# Patient Record
Sex: Female | Born: 1955
Health system: Southern US, Community
[De-identification: ages and names within clinical notes are randomized; demographics above are authoritative.]

## PROBLEM LIST (undated history)

## (undated) DIAGNOSIS — M79609 Pain in unspecified limb: Secondary | ICD-10-CM

## (undated) DIAGNOSIS — K219 Gastro-esophageal reflux disease without esophagitis: Secondary | ICD-10-CM

## (undated) DIAGNOSIS — N200 Calculus of kidney: Secondary | ICD-10-CM

## (undated) DIAGNOSIS — J449 Chronic obstructive pulmonary disease, unspecified: Secondary | ICD-10-CM

## (undated) DIAGNOSIS — M8580 Other specified disorders of bone density and structure, unspecified site: Secondary | ICD-10-CM

## (undated) DIAGNOSIS — E559 Vitamin D deficiency, unspecified: Secondary | ICD-10-CM

## (undated) DIAGNOSIS — E785 Hyperlipidemia, unspecified: Secondary | ICD-10-CM

## (undated) DIAGNOSIS — R42 Dizziness and giddiness: Secondary | ICD-10-CM

## (undated) DIAGNOSIS — M549 Dorsalgia, unspecified: Secondary | ICD-10-CM

## (undated) DIAGNOSIS — I480 Paroxysmal atrial fibrillation: Principal | ICD-10-CM

## (undated) HISTORY — DX: Hyperlipidemia, unspecified: E78.5

## (undated) HISTORY — DX: Calculus of kidney: N20.0

## (undated) HISTORY — DX: Gastro-esophageal reflux disease without esophagitis: K21.9

## (undated) HISTORY — DX: Pain in unspecified limb: M79.609

## (undated) HISTORY — DX: Dizziness and giddiness: R42

## (undated) HISTORY — DX: Paroxysmal atrial fibrillation: I48.0

## (undated) HISTORY — DX: Vitamin D deficiency, unspecified: E55.9

## (undated) HISTORY — DX: Dorsalgia, unspecified: M54.9

## (undated) HISTORY — DX: Other specified disorders of bone density and structure, unspecified site: M85.80

## (undated) HISTORY — DX: Chronic obstructive pulmonary disease, unspecified: J44.9

---

## 1999-02-22 ENCOUNTER — Other Ambulatory Visit: Admission: RE | Admit: 1999-02-22 | Discharge: 1999-02-22 | Payer: Self-pay | Admitting: Nurse Practitioner

## 2000-05-15 ENCOUNTER — Other Ambulatory Visit: Admission: RE | Admit: 2000-05-15 | Discharge: 2000-05-15 | Payer: Self-pay | Admitting: Internal Medicine

## 2003-06-24 ENCOUNTER — Other Ambulatory Visit: Admission: RE | Admit: 2003-06-24 | Discharge: 2003-06-24 | Payer: Self-pay | Admitting: Gynecology

## 2003-12-30 ENCOUNTER — Other Ambulatory Visit: Admission: RE | Admit: 2003-12-30 | Discharge: 2003-12-30 | Payer: Self-pay | Admitting: Gynecology

## 2005-01-17 ENCOUNTER — Other Ambulatory Visit: Admission: RE | Admit: 2005-01-17 | Discharge: 2005-01-17 | Payer: Self-pay | Admitting: Gynecology

## 2006-01-18 ENCOUNTER — Other Ambulatory Visit: Admission: RE | Admit: 2006-01-18 | Discharge: 2006-01-18 | Payer: Self-pay | Admitting: Gynecology

## 2006-04-12 ENCOUNTER — Ambulatory Visit: Payer: Self-pay | Admitting: Internal Medicine

## 2006-04-19 ENCOUNTER — Ambulatory Visit: Payer: Self-pay | Admitting: Internal Medicine

## 2006-06-19 ENCOUNTER — Ambulatory Visit: Payer: Self-pay | Admitting: Internal Medicine

## 2006-07-18 ENCOUNTER — Other Ambulatory Visit: Admission: RE | Admit: 2006-07-18 | Discharge: 2006-07-18 | Payer: Self-pay | Admitting: Gynecology

## 2006-11-09 ENCOUNTER — Encounter: Admission: RE | Admit: 2006-11-09 | Discharge: 2007-02-07 | Payer: Self-pay | Admitting: *Deleted

## 2007-01-23 ENCOUNTER — Other Ambulatory Visit: Admission: RE | Admit: 2007-01-23 | Discharge: 2007-01-23 | Payer: Self-pay | Admitting: Gynecology

## 2007-07-16 ENCOUNTER — Ambulatory Visit: Payer: Self-pay | Admitting: Internal Medicine

## 2007-07-16 LAB — CONVERTED CEMR LAB
AST: 40 units/L — ABNORMAL HIGH (ref 0–37)
Albumin: 3.8 g/dL (ref 3.5–5.2)
BUN: 14 mg/dL (ref 6–23)
Basophils Absolute: 0 10*3/uL (ref 0.0–0.1)
Basophils Relative: 0 % (ref 0.0–1.0)
Bilirubin Urine: NEGATIVE
Calcium: 10 mg/dL (ref 8.4–10.5)
Chloride: 107 meq/L (ref 96–112)
Creatinine, Ser: 0.7 mg/dL (ref 0.4–1.2)
Eosinophils Absolute: 0.1 10*3/uL (ref 0.0–0.6)
Eosinophils Relative: 0.8 % (ref 0.0–5.0)
Glucose, Bld: 95 mg/dL (ref 70–99)
HCT: 41.6 % (ref 36.0–46.0)
HDL: 46.2 mg/dL (ref >39.0)
Hemoglobin, Urine: NEGATIVE
Lymphocytes Relative: 33.5 % (ref 12.0–46.0)
MCHC: 35.2 g/dL (ref 30.0–36.0)
Monocytes Absolute: 0.4 10*3/uL (ref 0.2–0.7)
Monocytes Relative: 5.8 % (ref 3.0–11.0)
Neutro Abs: 4.4 10*3/uL (ref 1.4–7.7)
Neutrophils Relative %: 59.9 % (ref 43.0–77.0)
Nitrite: NEGATIVE
RBC / HPF: NONE SEEN
Specific Gravity, Urine: 1.02 (ref 1.000–1.03)
TSH: 1.73 microintl units/mL (ref 0.35–5.50)
Total CHOL/HDL Ratio: 3.2
Urobilinogen, UA: 0.2 (ref 0.0–1.0)
pH: 6.5 (ref 5.0–8.0)

## 2007-07-19 ENCOUNTER — Ambulatory Visit: Payer: Self-pay | Admitting: Internal Medicine

## 2007-10-01 ENCOUNTER — Ambulatory Visit: Payer: Self-pay | Admitting: Gastroenterology

## 2007-10-15 ENCOUNTER — Ambulatory Visit: Payer: Self-pay | Admitting: Gastroenterology

## 2007-10-15 LAB — HM COLONOSCOPY: HM Colonoscopy: NORMAL

## 2008-01-29 ENCOUNTER — Other Ambulatory Visit: Admission: RE | Admit: 2008-01-29 | Discharge: 2008-01-29 | Payer: Self-pay | Admitting: Gynecology

## 2008-04-10 ENCOUNTER — Encounter: Payer: Self-pay | Admitting: Internal Medicine

## 2009-02-02 ENCOUNTER — Ambulatory Visit: Payer: Self-pay | Admitting: Gynecology

## 2009-02-02 ENCOUNTER — Encounter: Payer: Self-pay | Admitting: Gynecology

## 2009-02-02 ENCOUNTER — Other Ambulatory Visit: Admission: RE | Admit: 2009-02-02 | Discharge: 2009-02-02 | Payer: Self-pay | Admitting: Gynecology

## 2009-04-13 ENCOUNTER — Encounter: Payer: Self-pay | Admitting: Internal Medicine

## 2009-05-29 ENCOUNTER — Ambulatory Visit: Payer: Self-pay | Admitting: Internal Medicine

## 2009-05-29 LAB — CONVERTED CEMR LAB
ALT: 35 units/L (ref 0–35)
Albumin: 3.6 g/dL (ref 3.5–5.2)
BUN: 18 mg/dL (ref 6–23)
Bilirubin, Direct: 0.1 mg/dL (ref 0.0–0.3)
CO2: 29 meq/L (ref 19–32)
Cholesterol: 249 mg/dL — ABNORMAL HIGH (ref 0–200)
Direct LDL: 194.2 mg/dL
Eosinophils Relative: 1.5 % (ref 0.0–5.0)
GFR calc non Af Amer: 79.77 mL/min (ref >60)
HDL: 39.7 mg/dL (ref >39.00)
Hemoglobin, Urine: NEGATIVE
Ketones, ur: NEGATIVE mg/dL
Lymphocytes Relative: 35.8 % (ref 12.0–46.0)
Lymphs Abs: 2.4 10*3/uL (ref 0.7–4.0)
Monocytes Relative: 7.4 % (ref 3.0–12.0)
Neutro Abs: 3.8 10*3/uL (ref 1.4–7.7)
Neutrophils Relative %: 54.6 % (ref 43.0–77.0)
Platelets: 212 10*3/uL (ref 150.0–400.0)
Potassium: 4.5 meq/L (ref 3.5–5.1)
Specific Gravity, Urine: >=1.03 (ref 1.000–1.030)
TSH: 3.53 microintl units/mL (ref 0.35–5.50)
Total Bilirubin: 0.7 mg/dL (ref 0.3–1.2)
Total Protein: 6.8 g/dL (ref 6.0–8.3)
Urine Glucose: NEGATIVE mg/dL
WBC: 6.8 10*3/uL (ref 4.5–10.5)

## 2009-06-03 ENCOUNTER — Ambulatory Visit: Payer: Self-pay | Admitting: Internal Medicine

## 2009-06-03 DIAGNOSIS — E785 Hyperlipidemia, unspecified: Secondary | ICD-10-CM

## 2009-06-03 DIAGNOSIS — R079 Chest pain, unspecified: Secondary | ICD-10-CM | POA: Insufficient documentation

## 2009-06-03 DIAGNOSIS — K219 Gastro-esophageal reflux disease without esophagitis: Secondary | ICD-10-CM

## 2009-06-03 DIAGNOSIS — E559 Vitamin D deficiency, unspecified: Secondary | ICD-10-CM | POA: Insufficient documentation

## 2009-06-03 HISTORY — DX: Gastro-esophageal reflux disease without esophagitis: K21.9

## 2009-06-03 HISTORY — DX: Hyperlipidemia, unspecified: E78.5

## 2009-06-03 HISTORY — DX: Vitamin D deficiency, unspecified: E55.9

## 2009-10-20 ENCOUNTER — Ambulatory Visit (HOSPITAL_COMMUNITY): Admission: RE | Admit: 2009-10-20 | Discharge: 2009-10-20 | Payer: Self-pay | Admitting: Chiropractic Medicine

## 2010-02-17 ENCOUNTER — Other Ambulatory Visit: Admission: RE | Admit: 2010-02-17 | Discharge: 2010-02-17 | Payer: Self-pay | Admitting: Gynecology

## 2010-02-17 ENCOUNTER — Ambulatory Visit: Payer: Self-pay | Admitting: Gynecology

## 2010-02-22 ENCOUNTER — Ambulatory Visit: Payer: Self-pay | Admitting: Gynecology

## 2010-03-18 ENCOUNTER — Ambulatory Visit: Payer: Self-pay | Admitting: Internal Medicine

## 2010-03-18 DIAGNOSIS — M79609 Pain in unspecified limb: Secondary | ICD-10-CM

## 2010-03-18 DIAGNOSIS — M549 Dorsalgia, unspecified: Secondary | ICD-10-CM

## 2010-03-18 HISTORY — DX: Pain in unspecified limb: M79.609

## 2010-03-18 HISTORY — DX: Dorsalgia, unspecified: M54.9

## 2010-03-21 LAB — HM MAMMOGRAPHY: HM Mammogram: NORMAL

## 2010-03-24 ENCOUNTER — Ambulatory Visit (HOSPITAL_COMMUNITY): Admission: RE | Admit: 2010-03-24 | Discharge: 2010-03-24 | Payer: Self-pay | Admitting: Internal Medicine

## 2010-04-13 ENCOUNTER — Ambulatory Visit: Payer: Self-pay | Admitting: Gynecology

## 2010-04-15 ENCOUNTER — Encounter: Payer: Self-pay | Admitting: Internal Medicine

## 2010-05-18 ENCOUNTER — Telehealth: Payer: Self-pay | Admitting: Internal Medicine

## 2010-06-15 ENCOUNTER — Ambulatory Visit: Payer: Self-pay | Admitting: Internal Medicine

## 2010-06-16 LAB — CONVERTED CEMR LAB
Albumin: 3.5 g/dL (ref 3.5–5.2)
Alkaline Phosphatase: 102 units/L (ref 39–117)
Basophils Absolute: 0 10*3/uL (ref 0.0–0.1)
Basophils Relative: 0.3 % (ref 0.0–3.0)
Bilirubin Urine: NEGATIVE
Bilirubin, Direct: 0.1 mg/dL (ref 0.0–0.3)
CO2: 28 meq/L (ref 19–32)
Calcium: 8.9 mg/dL (ref 8.4–10.5)
Cholesterol: 255 mg/dL — ABNORMAL HIGH (ref 0–200)
Creatinine, Ser: 0.8 mg/dL (ref 0.4–1.2)
Direct LDL: 198 mg/dL
Eosinophils Absolute: 0.1 10*3/uL (ref 0.0–0.7)
Eosinophils Relative: 0.9 % (ref 0.0–5.0)
Glucose, Bld: 86 mg/dL (ref 70–99)
HDL: 52.2 mg/dL (ref >39.00)
Hemoglobin, Urine: NEGATIVE
Ketones, ur: NEGATIVE mg/dL
Lymphs Abs: 2.3 10*3/uL (ref 0.7–4.0)
MCHC: 34.5 g/dL (ref 30.0–36.0)
MCV: 97.1 fL (ref 78.0–100.0)
Monocytes Relative: 7.3 % (ref 3.0–12.0)
Nitrite: NEGATIVE
Potassium: 5 meq/L (ref 3.5–5.1)
Sodium: 139 meq/L (ref 135–145)
Total Bilirubin: 0.7 mg/dL (ref 0.3–1.2)
Total CHOL/HDL Ratio: 5
Total Protein, Urine: NEGATIVE mg/dL
Triglycerides: 54 mg/dL (ref 0.0–149.0)
Urine Glucose: NEGATIVE mg/dL
Urobilinogen, UA: 0.2 (ref 0.0–1.0)
VLDL: 10.8 mg/dL (ref 0.0–40.0)
pH: 6.5 (ref 5.0–8.0)

## 2010-06-17 ENCOUNTER — Ambulatory Visit: Payer: Self-pay | Admitting: Internal Medicine

## 2010-06-17 ENCOUNTER — Encounter: Payer: Self-pay | Admitting: Internal Medicine

## 2010-06-17 DIAGNOSIS — R42 Dizziness and giddiness: Secondary | ICD-10-CM

## 2010-06-17 HISTORY — DX: Dizziness and giddiness: R42

## 2010-06-29 ENCOUNTER — Encounter: Payer: Self-pay | Admitting: Internal Medicine

## 2010-06-30 ENCOUNTER — Ambulatory Visit: Payer: Self-pay

## 2010-06-30 ENCOUNTER — Encounter: Payer: Self-pay | Admitting: Cardiovascular Disease

## 2010-08-19 ENCOUNTER — Ambulatory Visit: Payer: Self-pay | Admitting: Internal Medicine

## 2010-08-19 LAB — CONVERTED CEMR LAB
Bilirubin, Direct: 0.1 mg/dL (ref 0.0–0.3)
Total Bilirubin: 0.8 mg/dL (ref 0.3–1.2)
Total CHOL/HDL Ratio: 3

## 2010-12-19 LAB — CONVERTED CEMR LAB: Pap Smear: NORMAL

## 2010-12-21 NOTE — Miscellaneous (Signed)
Summary: Orders Update  Clinical Lists Changes  Orders: Added new Test order of Carotid Duplex (Carotid Duplex) - Signed 

## 2010-12-21 NOTE — Assessment & Plan Note (Signed)
Summary: LEG NUMBNESS BETWEEN KNEE AND HIP  STC   Vital Signs:  Patient profile:   55 year old female Height:      64 inches Weight:      175.25 pounds BMI:     30.19 O2 Sat:      94 % on Room air Temp:     96.9 degrees F oral Pulse rate:   73 / minute BP sitting:   110 / 72  (left arm) Cuff size:   regular  Vitals Entered ByZella Ball Ewing (March 18, 2010 11:14 AM)  O2 Flow:  Room air CC: Right thigh numbness/RE   CC:  Right thigh numbness/RE.  History of Present Illness: here with c/o 1-2 wks right lateral thigh numbness obvious to the touch, with a burning pain and points to the greater trochanter area and distally on the lateral thigh.  no obvious causal relation,  no back pain per se but occurs with worse disomcort with standing more than 30 min such as golf or cocktail party;  improves with ambulation, and better to sit as well after less than 5 minutes;  She is concerned due to the seveirity of discomfort, but also has a trip to [place] in [place] and wants to define the problem prior.  Denies specifically LE weakness, bowel or bladder change, fever, night sweats, wt loss or other constitutional symptoms.  No gait problem or fall or other injury or trauma.    Problems Prior to Update: 1)  Back Pain  (ICD-724.5) 2)  Leg Pain, Right  (ICD-729.5) 3)  Preventive Health Care  (ICD-V70.0) 4)  Chest Pain  (ICD-786.50) 5)  Gerd  (ICD-530.81) 6)  Vitamin D Deficiency  (ICD-268.9) 7)  Hyperlipidemia  (ICD-272.4)  Medications Prior to Update: 1)  Meclizine Hcl 25 Mg Tabs (Meclizine Hcl) .Marland Kitchen.. 1 By Mouth Once Daily As Needed For Vertigo 2)  Crestor 40 Mg Tabs (Rosuvastatin Calcium) .... 1/2 By Mouth Every Other Day  Current Medications (verified): 1)  Meclizine Hcl 25 Mg Tabs (Meclizine Hcl) .Marland Kitchen.. 1 By Mouth Once Daily As Needed For Vertigo 2)  Crestor 40 Mg Tabs (Rosuvastatin Calcium) .... 1/2 By Mouth Every Other Day 3)  Lyrica 50 Mg Caps (Pregabalin) .Marland Kitchen.. 1po Two Times A  Day  Allergies (verified): No Known Drug Allergies  Past History:  Past Medical History: Last updated: 06/03/2009 Hyperlipidemia low vit D GERD hx of right frozen shoulder - dr Johnell Comings chronic recurrent vertigo - - BPV, prob left related  Social History: Last updated: 06/03/2009 Current Smoker Alcohol use-no Married no children work - [employer]  Risk Factors: Smoking Status: current (06/03/2009)  Review of Systems       all otherwise negative per pt -    Physical Exam  General:  alert and overweight-appearing.   Head:  normocephalic and atraumatic.   Eyes:  vision grossly intact, pupils equal, and pupils round.   Ears:  R ear normal and L ear normal.   Nose:  no external deformity and no nasal discharge.   Mouth:  no gingival abnormalities and pharynx pink and moist.   Neck:  supple and no masses.   Lungs:  normal respiratory effort and normal breath sounds.   Heart:  normal rate and regular rhythm.   Abdomen:  soft, non-tender, and normal bowel sounds.   Msk:  no spine tender, but does have tender over right upper buttock/SI joint area wtihout erythema, swelling or rash Extremities:  no edema, no erythema  Neurologic:  cranial nerves II-XII intact, strength normal in all extremities, sensation intact to light touch, gait normal, and DTRs symmetrical and normal.  except for decresed sensation to right lateral thigh - ?l2 area   Impression & Recommendations:  Problem # 1:  LEG PAIN, RIGHT (ICD-729.5) suspect mild neuritis right lumbar area , doubt meralgia or fasciitis although these cannot be ruled out as well;  will order MRI ls spine and NS referral, pt to f/u any worsening symtpoms  Orders: Neurosurgeon Referral Psychologist, educational) Radiology Referral (Radiology)  Problem # 2:  BACK PAIN (ICD-724.5) discussoin as above  Orders: Neurosurgeon Referral Psychologist, educational) Radiology Referral (Radiology)  Complete Medication List: 1)  Meclizine Hcl 25 Mg Tabs  (Meclizine hcl) .Marland Kitchen.. 1 by mouth once daily as needed for vertigo 2)  Crestor 40 Mg Tabs (Rosuvastatin calcium) .... 1/2 by mouth every other day 3)  Lyrica 50 Mg Caps (Pregabalin) .Marland Kitchen.. 1po two times a day  Patient Instructions: 1)  Please take all new medications as prescribed 2)  Continue all previous medications as before this visit 3)  You will be contacted about the referral(s) to: MRI for the lower back, and Neurosurgury referral for opinion on the lower back 4)  Please schedule a follow-up appointment in 3 months with CPX labs Prescriptions: LYRICA 50 MG CAPS (PREGABALIN) 1po two times a day  #60 x 5   Entered and Authorized by:   Corwin Levins MD   Signed by:   Corwin Levins MD on 03/18/2010   Method used:   Print then Give to Patient   RxID:   [id number]

## 2010-12-21 NOTE — Progress Notes (Signed)
Summary: Rx refill req  Phone Note Call from Patient Call back at Work Phone [phone]   Caller: Patient Summary of Call: Pt called stating she forgot to ask MD at last OV to refill Rx for Vertigo, Meclizine. Rx sent to pharmacy per pt request Initial call taken by: Margaret Pyle, CMA,  May 18, 2010 4:23 PM    Prescriptions: MECLIZINE HCL 25 MG TABS (MECLIZINE HCL) 1 by mouth once daily as needed for vertigo  #30 x 5   Entered by:   Margaret Pyle, CMA   Authorized by:   Corwin Levins MD   Signed by:   Margaret Pyle, CMA on 05/18/2010   Method used:   Electronically to        [facility]  [address] (retail)       [address]       [place], [place]  [address]       Ph: [phone] or [phone]       Fax: [phone]   RxID:   [id number]

## 2010-12-21 NOTE — Assessment & Plan Note (Signed)
Summary: CPX/NWS   Vital Signs:  Patient profile:   55 year old female Height:      65 inches Weight:      171.75 pounds BMI:     28.68 O2 Sat:      98 % on Room air Temp:     97.6 degrees F oral Pulse rate:   75 / minute BP sitting:   110 / 70  (left arm) Cuff size:   regular  Vitals Entered By: Zella Ball Ewing CMA Duncan Dull) (June 17, 2010 1:17 PM)  O2 Flow:  Room air  Preventive Care Screening  Pap Smear:    Date:  03/21/2010    Results:  normal   Mammogram:    Date:  03/21/2010    Results:  normal   CC: Adult Physical/RE   CC:  Adult Physical/RE.  History of Present Illness: overall doing well, no complaints except has persistent mild numbness to right lateral thigh, without pain, or LE weakness;  recent MRI LS spine essentially neg.  Pt denies CP, sob, doe, wheezing, orthopnea, pnd, worsening LE edema, palps, dizziness or syncope  Pt denies new neuro symptoms such as headache, facial or extremity weakness   Problems Prior to Update: 1)  Postural Lightheadedness  (ICD-780.4) 2)  Back Pain  (ICD-724.5) 3)  Leg Pain, Right  (ICD-729.5) 4)  Preventive Health Care  (ICD-V70.0) 5)  Chest Pain  (ICD-786.50) 6)  Gerd  (ICD-530.81) 7)  Vitamin D Deficiency  (ICD-268.9) 8)  Hyperlipidemia  (ICD-272.4)  Medications Prior to Update: 1)  Meclizine Hcl 25 Mg Tabs (Meclizine Hcl) .Marland Kitchen.. 1 By Mouth Once Daily As Needed For Vertigo 2)  Crestor 40 Mg Tabs (Rosuvastatin Calcium) .... 1/2 By Mouth Every Other Day 3)  Lyrica 50 Mg Caps (Pregabalin) .Marland Kitchen.. 1po Two Times A Day  Current Medications (verified): 1)  Meclizine Hcl 25 Mg Tabs (Meclizine Hcl) .Marland Kitchen.. 1 By Mouth Once Daily As Needed For Vertigo 2)  Lipitor 20 Mg Tabs (Atorvastatin Calcium) .Marland Kitchen.. 1po Once Daily 3)  Lyrica 50 Mg Caps (Pregabalin) .Marland Kitchen.. 1po Two Times A Day  Allergies (verified): No Known Drug Allergies  Past History:  Past Medical History: Last updated: 06/03/2009 Hyperlipidemia low vit D GERD hx of right  frozen shoulder - dr Johnell Comings chronic recurrent vertigo - - BPV, prob left related  Family History: Last updated: 06/03/2009 sister and father with melanoma  Social History: Last updated: 06/03/2009 Current Smoker Alcohol use-no Married no children work - [employer]  Risk Factors: Smoking Status: current (06/03/2009)  Review of Systems  The patient denies anorexia, fever, weight loss, weight gain, vision loss, decreased hearing, hoarseness, chest pain, syncope, dyspnea on exertion, peripheral edema, prolonged cough, headaches, hemoptysis, abdominal pain, melena, hematochezia, severe indigestion/heartburn, hematuria, muscle weakness, suspicious skin lesions, transient blindness, difficulty walking, depression, unusual weight change, abnormal bleeding, enlarged lymph nodes, and angioedema.         all otherwise negative per pt -  except for occasional recurring lightheaded/vertigo  Physical Exam  General:  alert and overweight-appearing.   Head:  normocephalic and atraumatic.   Eyes:  vision grossly intact, pupils equal, and pupils round.   Ears:  R ear normal and L ear normal.   Nose:  no external deformity and no nasal discharge.   Mouth:  no gingival abnormalities and pharynx pink and moist.   Neck:  supple and no masses.   Lungs:  normal respiratory effort and normal breath sounds.   Heart:  normal rate and regular  rhythm.   Abdomen:  soft, non-tender, and normal bowel sounds.   Msk:  no joint tenderness and no joint swelling.   Extremities:  no edema, no erythema  Neurologic:  cranial nerves II-XII intact, strength normal in all extremities, and gait normal.   Skin:  color normal and no rashes.   Psych:  memory intact for recent and remote and normally interactive.     Impression & Recommendations:  Problem # 1:  Preventive Health Care (ICD-V70.0) Overall doing well, age appropriate education and counseling updated and referral for appropriate preventive services  done unless declined, immunizations up to date or declined, diet counseling done if overweight, urged to quit smoking if smokes , most recent labs reviewed and current ordered if appropriate, ecg reviewed or declined (interpretation per ECG scanned in the EMR if done); information regarding Medicare Prevention requirements given if appropriate; speciality referrals updated as appropriate ; urged to quit smoking  Problem # 2:  POSTURAL LIGHTHEADEDNESS (ICD-780.4)  Her updated medication list for this problem includes:    Meclizine Hcl 25 Mg Tabs (Meclizine hcl) .Marland Kitchen... 1 by mouth once daily as needed for vertigo ? vertigo - like - for carotid dopplers in light of her chronic smoking and severe elev chol  Orders: Radiology Referral (Radiology)  Problem # 3:  HYPERLIPIDEMIA (ICD-272.4)  Her updated medication list for this problem includes:    Lipitor 20 Mg Tabs (Atorvastatin calcium) .Marland Kitchen... 1po once daily severe, unable to take more crestor due to intolerance;  to change to lipitor 20 mg , f/u labs 2 mo;  consider lipid clinic   Labs Reviewed: SGOT: 60 (06/15/2010)   SGPT: 57 (06/15/2010)   HDL:52.20 (06/15/2010), 39.70 (05/29/2009)  LDL:90 (07/16/2007)  Chol:255 (06/15/2010), 249 (05/29/2009)  Trig:54.0 (06/15/2010), 99.0 (05/29/2009)  Complete Medication List: 1)  Meclizine Hcl 25 Mg Tabs (Meclizine hcl) .Marland Kitchen.. 1 by mouth once daily as needed for vertigo 2)  Lipitor 20 Mg Tabs (Atorvastatin calcium) .Marland Kitchen.. 1po once daily 3)  Lyrica 50 Mg Caps (Pregabalin) .Marland Kitchen.. 1po two times a day  Patient Instructions: 1)  stop the crestor 2)  start the lipitor at 20 mg per day 3)  you can go to Lipitor.com for coupon for money off on the lipitor prescription co-pay 4)  please return in 8 wks for LAB only: 5)  Lipid Panel prior to visit, ICD-9: 272.0 6)  Hepatic Panel prior to visit, ICD-9: v58.69 7)  You will be contacted about the referral(s) to: Carotid dopplers 8)  Continue all previous medications as  before this visit  9)  Please schedule a follow-up appointment in 1 year or sooner if needed Prescriptions: LIPITOR 20 MG TABS (ATORVASTATIN CALCIUM) 1po once daily  #90 x 3   Entered and Authorized by:   Corwin Levins MD   Signed by:   Corwin Levins MD on 06/17/2010   Method used:   Print then Give to Patient   RxID:   [id number]

## 2011-02-02 ENCOUNTER — Other Ambulatory Visit: Payer: Self-pay | Admitting: Dermatology

## 2011-02-21 ENCOUNTER — Other Ambulatory Visit: Payer: Self-pay | Admitting: Gynecology

## 2011-02-21 ENCOUNTER — Other Ambulatory Visit (HOSPITAL_COMMUNITY)
Admission: RE | Admit: 2011-02-21 | Discharge: 2011-02-21 | Disposition: A | Discharge status: Home / Self Care | Payer: 59 | Source: Ambulatory Visit | Attending: Gynecology | Admitting: Gynecology

## 2011-02-21 ENCOUNTER — Encounter (INDEPENDENT_AMBULATORY_CARE_PROVIDER_SITE_OTHER): Payer: 59 | Admitting: Gynecology

## 2011-02-21 DIAGNOSIS — Z01419 Encounter for gynecological examination (general) (routine) without abnormal findings: Secondary | ICD-10-CM

## 2011-02-21 DIAGNOSIS — Z124 Encounter for screening for malignant neoplasm of cervix: Secondary | ICD-10-CM | POA: Insufficient documentation

## 2011-04-27 ENCOUNTER — Encounter: Payer: Self-pay | Admitting: Internal Medicine

## 2011-06-23 ENCOUNTER — Other Ambulatory Visit: Payer: Self-pay

## 2011-06-23 MED ORDER — ATORVASTATIN CALCIUM 20 MG PO TABS: 20.0000 mg | ORAL_TABLET | Freq: Every day | ORAL | Status: DC

## 2011-08-09 ENCOUNTER — Encounter: Payer: Self-pay | Admitting: Endocrinology

## 2011-08-09 ENCOUNTER — Ambulatory Visit (INDEPENDENT_AMBULATORY_CARE_PROVIDER_SITE_OTHER): Payer: 59 | Admitting: Endocrinology

## 2011-08-09 VITALS — BP 96/68 | HR 68 | Temp 97.7°F | Ht 65.0 in | Wt 162.5 lb

## 2011-08-09 DIAGNOSIS — M25519 Pain in unspecified shoulder: Secondary | ICD-10-CM

## 2011-08-09 DIAGNOSIS — M25512 Pain in left shoulder: Secondary | ICD-10-CM

## 2011-08-09 NOTE — Patient Instructions (Signed)
Pt is advised to go to er, as we do not have ecg capability today.

## 2011-08-13 ENCOUNTER — Observation Stay: Payer: Self-pay | Admitting: Internal Medicine

## 2011-08-13 NOTE — Progress Notes (Signed)
  Subjective:    Patient ID: Brittany Burton, female    DOB: 1956/02/27, 55 y.o.   MRN: [id number]  HPI sxs are noted.  Computer system, including ecg, is inoperative   Review of Systems     Objective:   Physical Exam        Assessment & Plan:

## 2011-08-14 DIAGNOSIS — I4891 Unspecified atrial fibrillation: Secondary | ICD-10-CM

## 2011-08-14 DIAGNOSIS — R079 Chest pain, unspecified: Secondary | ICD-10-CM

## 2011-08-16 ENCOUNTER — Other Ambulatory Visit: Payer: Self-pay | Admitting: Cardiovascular Disease

## 2011-08-16 ENCOUNTER — Telehealth: Payer: Self-pay | Admitting: Cardiovascular Disease

## 2011-08-16 DIAGNOSIS — R6884 Jaw pain: Secondary | ICD-10-CM

## 2011-08-16 DIAGNOSIS — R0602 Shortness of breath: Secondary | ICD-10-CM

## 2011-08-16 NOTE — Telephone Encounter (Signed)
Spoke to pt, notified per Dr. Mariah Milling and hosp note that we will schedule treadmill myoview and will call back with details.

## 2011-08-16 NOTE — Telephone Encounter (Signed)
Pt calling was seen at [facility] and was told that we would set up a stress test for her.

## 2011-08-17 ENCOUNTER — Telehealth: Payer: Self-pay | Admitting: *Deleted

## 2011-08-17 NOTE — Telephone Encounter (Signed)
Called pt and gave instructions for her treadmill myoview for Friday 08/19/11 @ 0730. Pt will pre register tomorrow, will arrive Fri at reg at 0700. Pt will hold metoprolol the evening prior and morning of test. NPO 4 hrs prior and no caffeine/ or decaf 12 hrs prior. Pt will call with any further questions.

## 2011-08-19 ENCOUNTER — Ambulatory Visit: Payer: Self-pay | Admitting: Cardiovascular Disease

## 2011-08-19 DIAGNOSIS — R079 Chest pain, unspecified: Secondary | ICD-10-CM

## 2011-08-22 ENCOUNTER — Encounter: Payer: Self-pay | Admitting: Cardiovascular Disease

## 2011-08-23 ENCOUNTER — Encounter: Payer: Self-pay | Admitting: *Deleted

## 2011-08-28 ENCOUNTER — Encounter: Payer: Self-pay | Admitting: Internal Medicine

## 2011-08-28 DIAGNOSIS — Z0000 Encounter for general adult medical examination without abnormal findings: Secondary | ICD-10-CM | POA: Insufficient documentation

## 2011-08-28 DIAGNOSIS — Z0001 Encounter for general adult medical examination with abnormal findings: Secondary | ICD-10-CM | POA: Insufficient documentation

## 2011-09-02 ENCOUNTER — Ambulatory Visit (INDEPENDENT_AMBULATORY_CARE_PROVIDER_SITE_OTHER): Payer: 59 | Admitting: Internal Medicine

## 2011-09-02 ENCOUNTER — Encounter: Payer: Self-pay | Admitting: Internal Medicine

## 2011-09-02 VITALS — BP 100/64 | HR 91 | Temp 97.5°F | Ht 65.0 in | Wt 162.2 lb

## 2011-09-02 DIAGNOSIS — Z0000 Encounter for general adult medical examination without abnormal findings: Secondary | ICD-10-CM

## 2011-09-02 DIAGNOSIS — R42 Dizziness and giddiness: Secondary | ICD-10-CM

## 2011-09-02 DIAGNOSIS — I4891 Unspecified atrial fibrillation: Secondary | ICD-10-CM

## 2011-09-02 DIAGNOSIS — K219 Gastro-esophageal reflux disease without esophagitis: Secondary | ICD-10-CM

## 2011-09-02 DIAGNOSIS — E785 Hyperlipidemia, unspecified: Secondary | ICD-10-CM

## 2011-09-02 DIAGNOSIS — I480 Paroxysmal atrial fibrillation: Secondary | ICD-10-CM

## 2011-09-02 MED ORDER — PANTOPRAZOLE SODIUM 40 MG PO TBEC: 40.0000 mg | DELAYED_RELEASE_TABLET | Freq: Every day | ORAL | Status: DC

## 2011-09-02 MED ORDER — ATORVASTATIN CALCIUM 20 MG PO TABS: 20.0000 mg | ORAL_TABLET | Freq: Every day | ORAL | Status: DC

## 2011-09-02 MED ORDER — ASPIRIN EC 81 MG PO TBEC: 81.0000 mg | DELAYED_RELEASE_TABLET | Freq: Every day | ORAL | Status: AC

## 2011-09-02 MED ORDER — MECLIZINE HCL 25 MG PO TABS: 25.0000 mg | ORAL_TABLET | Freq: Every day | ORAL | Status: DC | PRN

## 2011-09-02 NOTE — Progress Notes (Signed)
Subjective:    Patient ID: Brittany Burton, female    DOB: 1956-05-14, 55 y.o.   MRN: [id number]  HPI  55yo WF Brittany Burton executive, Here to f/u after recent hospn approx 2 wks ago at [facility] with new onset Afib after palpitations with playing golf;  No AMS, had echo and neg stress test per cardiology (Dr Mariah Milling, [facility]), no CHF and not felt to need coumadin, but tx with prn metoprolol, or diltiazem for more severe symptoms; since d/c she has has one significant episode palp assoc with sob, did take the metoprolol which was effective at 30 min.  She correllates the palps at times with  indigestion/reflux type chest discomfort per pt ;  No dysphagia or symptoms until the palp's start and resolved with resolution of the palps, has intentionally lost 10 lbs with better diet/lower calories; does have mid upper and left abd pain with radiation to the back with indigestion symptoms, assoc also with bloating with eating; has also some constipation, but no diarrhea, no blood. Some increased stressors recently - Denies worsening depressive symptoms, suicidal ideation, or panic.  No longer taking the lyrica as it no longer seems to help.  Has been playing golf earlier this wk without difficulty, 3 hours at a stretch.  Has been trying to follow lower chol diet, lipitor not changed or adjsuted with last hospn, and has been compliant.  Not ready yet to quit smoking.  Still with recurrent vertigo, meclizine helps but no falls and Pt denies new neurological symptoms such as new headache, or facial or extremity weakness or numbness Past Medical History  Diagnosis Date  . VITAMIN D DEFICIENCY 06/03/2009  . HYPERLIPIDEMIA 06/03/2009  . GERD 06/03/2009  . POSTURAL LIGHTHEADEDNESS 06/17/2010  . LEG PAIN, RIGHT 03/18/2010  . BACK PAIN 03/18/2010  . Vertigo     chronic recurrent, BPV, probl left related   No past surgical history on file.  reports that she has been smoking Cigarettes.  She has a 45 pack-year  smoking history. She does not have any smokeless tobacco history on file. She reports that she does not drink alcohol. Her drug history not on file. family history includes Melanoma in her father and sister. No Known Allergies No current outpatient prescriptions on file prior to visit.    Review of Systems Review of Systems  Constitutional: Negative for diaphoresis and unexpected weight change.  HENT: Negative for drooling and tinnitus.   Eyes: Negative for photophobia and visual disturbance.  Respiratory: Negative for choking and stridor.   Gastrointestinal: Negative for vomiting and blood in stool.  Genitourinary: Negative for hematuria and decreased urine volume.     Objective:   Physical Exam BP 100/64  Pulse 91  Temp(Src) 97.5 F (36.4 C) (Oral)  Ht 5\' 5"  (1.651 m)  Wt 162 lb 3 oz (73.568 kg)  BMI 26.99 kg/m2  SpO2 99% Physical Exam  VS noted. Not ill appearing Constitutional: Pt appears well-developed and well-nourished.  HENT: Head: Normocephalic.  Right Ear: External ear normal.  Left Ear: External ear normal.  Eyes: Conjunctivae and EOM are normal. Pupils are equal, round, and reactive to light.  Neck: Normal range of motion. Neck supple.  Cardiovascular: Normal rate and regular rhythm.   Pulmonary/Chest: Effort normal and breath sounds normal.  Abd:  Soft, NT, non-distended, + BS Neurological: Pt is alert. No cranial nerve deficit.  Motor/sens/dtr intact  Skin: Skin is warm. No erythema.  Psychiatric: Pt behavior is normal. Thought content normal. 1+ nervous  Assessment & Plan:

## 2011-09-02 NOTE — Patient Instructions (Addendum)
Take all new medications as prescribed - the protonix once in the AM Please call if not improved or further wt loss, as you may need twice daily treatment, and GI evaluation Continue all other medications as before If not already, you should be taking Aspirin 81 mg - 1 per day - COATED only Please return in 6 mo with Lab testing done 3-5 days before

## 2011-09-04 ENCOUNTER — Encounter: Payer: Self-pay | Admitting: Internal Medicine

## 2011-09-04 DIAGNOSIS — R42 Dizziness and giddiness: Secondary | ICD-10-CM | POA: Insufficient documentation

## 2011-09-04 DIAGNOSIS — I480 Paroxysmal atrial fibrillation: Secondary | ICD-10-CM

## 2011-09-04 HISTORY — DX: Paroxysmal atrial fibrillation: I48.0

## 2011-09-04 NOTE — Assessment & Plan Note (Signed)
stable overall by hx and exam, most recent data reviewed with pt, and pt to continue medical treatment as before, to refill med today

## 2011-09-04 NOTE — Assessment & Plan Note (Signed)
stable overall by hx and exam, most recent data reviewed with pt, and pt to continue medical treatment as before  Lab Results  Component Value Date   TSH 1.89 06/15/2010

## 2011-09-04 NOTE — Assessment & Plan Note (Signed)
With increased symptoms reflux and gastritis like as well;  Has lost 10 lbs but intentional and o/w essentially benign exam, will start PPI for at least 30 days, pt to call or return for any persistent or worsening symptoms for GI evaluation, which she declines at least to start for now

## 2011-09-04 NOTE — Assessment & Plan Note (Signed)
stable overall by hx and exam, most recent data reviewed with pt, and pt to continue medical treatment as before  Lab Results  Component Value Date   LDLCALC 111* 08/19/2010   To work further on lower chol diet, cont lipitor, I suspect likely had Lipids checked with recent hospn, will follow for now

## 2012-02-29 ENCOUNTER — Ambulatory Visit (INDEPENDENT_AMBULATORY_CARE_PROVIDER_SITE_OTHER): Payer: 59 | Admitting: Sports Medicine

## 2012-02-29 ENCOUNTER — Other Ambulatory Visit (INDEPENDENT_AMBULATORY_CARE_PROVIDER_SITE_OTHER): Payer: 59

## 2012-02-29 VITALS — BP 120/80 | Ht 65.0 in | Wt 165.0 lb

## 2012-02-29 DIAGNOSIS — Z0000 Encounter for general adult medical examination without abnormal findings: Secondary | ICD-10-CM

## 2012-02-29 DIAGNOSIS — G5711 Meralgia paresthetica, right lower limb: Secondary | ICD-10-CM | POA: Insufficient documentation

## 2012-02-29 DIAGNOSIS — G5710 Meralgia paresthetica, unspecified lower limb: Secondary | ICD-10-CM

## 2012-02-29 LAB — HEPATIC FUNCTION PANEL
ALT: 39 U/L — ABNORMAL HIGH (ref 0–35)
AST: 41 U/L — ABNORMAL HIGH (ref 0–37)
Albumin: 3.7 g/dL (ref 3.5–5.2)
Alkaline Phosphatase: 88 U/L (ref 39–117)

## 2012-02-29 LAB — BASIC METABOLIC PANEL
CO2: 26 mEq/L (ref 19–32)
Calcium: 9.1 mg/dL (ref 8.4–10.5)
Chloride: 112 mEq/L (ref 96–112)
GFR: 83.77 mL/min (ref >60.00)
Glucose, Bld: 99 mg/dL (ref 70–99)
Potassium: 5.2 mEq/L — ABNORMAL HIGH (ref 3.5–5.1)
Sodium: 144 mEq/L (ref 135–145)

## 2012-02-29 LAB — URINALYSIS, ROUTINE W REFLEX MICROSCOPIC
Hgb urine dipstick: NEGATIVE
Ketones, ur: NEGATIVE
Urine Glucose: NEGATIVE
Urobilinogen, UA: 1 (ref 0.0–1.0)

## 2012-02-29 LAB — CBC WITH DIFFERENTIAL/PLATELET
Basophils Absolute: 0 10*3/uL (ref 0.0–0.1)
Eosinophils Absolute: 0.1 10*3/uL (ref 0.0–0.7)
HCT: 42.3 % (ref 36.0–46.0)
Hemoglobin: 14.3 g/dL (ref 12.0–15.0)
Lymphs Abs: 1.8 10*3/uL (ref 0.7–4.0)
MCHC: 33.9 g/dL (ref 30.0–36.0)
Monocytes Absolute: 0.5 10*3/uL (ref 0.1–1.0)
Neutro Abs: 4.4 10*3/uL (ref 1.4–7.7)
RDW: 12.9 % (ref 11.5–14.6)

## 2012-02-29 LAB — LIPID PANEL
Cholesterol: 163 mg/dL (ref 0–200)
LDL Cholesterol: 109 mg/dL — ABNORMAL HIGH (ref 0–99)
Total CHOL/HDL Ratio: 4
Triglycerides: 38 mg/dL (ref 0.0–149.0)

## 2012-02-29 MED ORDER — GABAPENTIN 300 MG PO CAPS: ORAL_CAPSULE | ORAL | Status: DC

## 2012-02-29 NOTE — Assessment & Plan Note (Addendum)
We'll start conservative, gabapentin up taper, as well as stretches. If not better in one month we'll consider an ultrasound evaluation Surgical release is unlikely to help  Consider SIJ dysfunction as cause for radiating sxs

## 2012-02-29 NOTE — Progress Notes (Signed)
  Subjective:    Patient ID: Brittany Burton, female    DOB: 27-Feb-1956, 56 y.o.   MRN: [id number]  HPI Brittany Burton comes to see [place] to discuss a sensation of numbness and tingling along the right lateral thigh. This is been present for approximately 2 years, and she notes it's worse when standing still for long periods of time. This incision goes from lateral hip down to just above the knee, but does not cross the midline, and does not go past her knee. It has not worsened by any other position, not worsened by tight clothing, and she does not recall any recent injuries. She has seen a neurologist in the past who placed her on some anti-inflammatories which has not helped. She's also had an MRI scan as well as lumbar x-rays that were negative for any discrete area of foraminal and lateral recess stenosis.   Past medical history: Hyperlipidemia on Lipitor. Surgical history: None. Social history: Works as the Electrical engineer at [employer], denies use of alcohol or drugs, smokes about a half a pack of cigarettes a day. Family history: No diabetes, hypertension, or heart disease. Review of Systems    No fevers, chills, night sweats, weight loss, chest pain, or shortness of breath.  Social History: Non-smoker. Objective:   Physical Exam General:  Well developed, well nourished, and in no acute distress. Neuro:  Alert and oriented x3, extra-ocular muscles intact. Skin: Warm and dry, no rashes noted. Respiratory:  Not using accessory muscles, speaking in full sentences. Musculoskeletal: Right Hip: ROM IR: 45 Deg, ER: 45 Deg, Flexion: 120 Deg, Extension: 100 Deg, Abduction: 45 Deg, Adduction: 45 Deg Strength IR: 5/5, ER: 5/5, Flexion: 5/5, Extension: 5/5, Abduction: 5/5, Adduction: 5/5 Pelvic alignment unremarkable to inspection and palpation. Standing hip rotation and gait without trendelenburg sign / unsteadiness. Mild tenderness to palpation proximal to the greater  trochanter. No tenderness over piriformis and greater trochanter. No pain with FABER or FADIR. No SI joint tenderness and normal minimal SI movement.  Back Exam: Inspection: Unremarkable Motion: Flexion 45 deg, Extension 45 deg, Side Bending to 45 deg bilaterally,  Rotation to 45 deg bilaterally SLR laying:  Negative XSLR laying: Negative Palpable tenderness: None FABER: negative Sensory change: Gross sensation intact to all lumbar and sacral dermatomes. Reflexes: 2+ at both patellar tendons, 2+ at achilles tendons, Babinski's downgoing.  Strength at foot Plantar-flexion: 5/5    Dorsi-flexion: 5/5    Eversion: 5/5   Inversion: 5/5 Leg strength Quad: 5/5   Hamstring: 5/5   Hip flexor: 5/5   Hip abductors: 5/5 Gait unremarkable.       Assessment & Plan:

## 2012-03-02 ENCOUNTER — Encounter: Payer: Self-pay | Admitting: Internal Medicine

## 2012-03-02 ENCOUNTER — Ambulatory Visit (INDEPENDENT_AMBULATORY_CARE_PROVIDER_SITE_OTHER): Payer: 59 | Admitting: Internal Medicine

## 2012-03-02 VITALS — BP 112/88 | HR 75 | Temp 97.3°F | Ht 65.0 in | Wt 167.0 lb

## 2012-03-02 DIAGNOSIS — Z0000 Encounter for general adult medical examination without abnormal findings: Secondary | ICD-10-CM

## 2012-03-02 NOTE — Progress Notes (Signed)
Subjective:    Patient ID: Brittany Burton, female    DOB: March 03, 1956, 56 y.o.   MRN: [id number]  HPI  Here for wellness and f/u;  Overall doing ok;  Pt denies CP, worsening SOB, DOE, wheezing, orthopnea, PND, worsening LE edema, palpitations, dizziness or syncope.  Pt denies neurological change such as new Headache, facial or extremity weakness.  Pt denies polydipsia, polyuria, or low sugar symptoms. Pt states overall good compliance with treatment and medications, good tolerability, and trying to follow lower cholesterol diet.  Pt denies worsening depressive symptoms, suicidal ideation or panic. No fever, wt loss, night sweats, loss of appetite, or other constitutional symptoms.  Pt states good ability with ADL's, low fall risk, home safety reviewed and adequate, no significant changes in hearing or vision, and occasionally active with exercise.  Still having some meralgia type numbness to the right thigh, better on gabapentin.  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 Past Medical History  Diagnosis Date  . VITAMIN D DEFICIENCY 06/03/2009  . HYPERLIPIDEMIA 06/03/2009  . GERD 06/03/2009  . POSTURAL LIGHTHEADEDNESS 06/17/2010  . LEG PAIN, RIGHT 03/18/2010  . BACK PAIN 03/18/2010  . Vertigo     chronic recurrent, BPV, probl left related  . PAF (paroxysmal atrial fibrillation) 09/04/2011   No past surgical history on file.  reports that she has been smoking Cigarettes.  She has a 45 pack-year smoking history. She does not have any smokeless tobacco history on file. She reports that she does not drink alcohol. Her drug history not on file. family history includes Melanoma in her father and sister. No Known Allergies Current Outpatient Prescriptions on File Prior to Visit  Medication Sig Dispense Refill  . aspirin EC 81 MG tablet Take 1 tablet (81 mg total) by mouth daily.  150 tablet  2  . atorvastatin (LIPITOR) 20 MG tablet Take 1  tablet (20 mg total) by mouth daily.  90 tablet  3  . gabapentin (NEURONTIN) 300 MG capsule One tab PO qHS for a week, then BID for a week, then TID.  May increase to a max of 4 tabs PO TID.  180 capsule  3  . meclizine (ANTIVERT) 25 MG tablet Take 1 tablet (25 mg total) by mouth daily as needed.  60 tablet  5  . pantoprazole (PROTONIX) 40 MG tablet Take 1 tablet (40 mg total) by mouth daily.  90 tablet  3   Review of Systems Review of Systems  Constitutional: Negative for diaphoresis, activity change, appetite change and unexpected weight change.  HENT: Negative for hearing loss, ear pain, facial swelling, mouth sores and neck stiffness.   Eyes: Negative for pain, redness and visual disturbance.  Respiratory: Negative for shortness of breath and wheezing.   Cardiovascular: Negative for chest pain and palpitations.  Gastrointestinal: Negative for diarrhea, blood in stool, abdominal distention and rectal pain.  Genitourinary: Negative for hematuria, flank pain and decreased urine volume.  Musculoskeletal: Negative for myalgias and joint swelling.  Skin: Negative for color change and wound.  Neurological: Negative for syncope and numbness.  Hematological: Negative for adenopathy.  Psychiatric/Behavioral: Negative for hallucinations, self-injury, decreased concentration and agitation.      Objective:   Physical Exam BP 112/88  Pulse 75  Temp(Src) 97.3 F (36.3 C) (Oral)  Ht 5\' 5"  (1.651 m)  Wt 167 lb (75.751 kg)  BMI 27.79 kg/m2  SpO2 97% Physical Exam  VS noted Constitutional: Pt  is oriented to person, place, and time. Appears well-developed and well-nourished.  HENT:  Head: Normocephalic and atraumatic.  Right Ear: External ear normal.  Left Ear: External ear normal.  Nose: Nose normal.  Mouth/Throat: Oropharynx is clear and moist.  Eyes: Conjunctivae and EOM are normal. Pupils are equal, round, and reactive to light.  Neck: Normal range of motion. Neck supple. No JVD present.  No tracheal deviation present.  Cardiovascular: Normal rate, regular rhythm, normal heart sounds and intact distal pulses.   Pulmonary/Chest: Effort normal and breath sounds normal.  Abdominal: Soft. Bowel sounds are normal. There is no tenderness.  Musculoskeletal: Normal range of motion. Exhibits no edema.  Lymphadenopathy:  Has no cervical adenopathy.  Neurological: Pt is alert and oriented to person, place, and time. Pt has normal reflexes. No cranial nerve deficit.  Skin: Skin is warm and dry. No rash noted.  Psychiatric:  Has  normal mood and affect. Behavior is normal.     Assessment & Plan:

## 2012-03-02 NOTE — Patient Instructions (Signed)
Continue all other medications as before Please keep your appointments with your specialists as you have planned - Dr Darrick Penna Please return in 1 year for your yearly visit, or sooner if needed, with Lab testing done 3-5 days before

## 2012-03-03 ENCOUNTER — Encounter: Payer: Self-pay | Admitting: Internal Medicine

## 2012-03-03 NOTE — Assessment & Plan Note (Signed)

## 2012-04-02 ENCOUNTER — Ambulatory Visit (INDEPENDENT_AMBULATORY_CARE_PROVIDER_SITE_OTHER): Payer: 59 | Admitting: Sports Medicine

## 2012-04-02 VITALS — BP 122/82

## 2012-04-02 DIAGNOSIS — G5711 Meralgia paresthetica, right lower limb: Secondary | ICD-10-CM

## 2012-04-02 DIAGNOSIS — G5710 Meralgia paresthetica, unspecified lower limb: Secondary | ICD-10-CM

## 2012-04-02 MED ORDER — GABAPENTIN 300 MG PO CAPS: ORAL_CAPSULE | ORAL | Status: DC

## 2012-04-02 NOTE — Assessment & Plan Note (Addendum)
Improved during the nighttime on gabapentin but daytime symptoms worsening. Ultrasound today showing thickened versus swollen right lateral femoral cutaneous nerve.  PLAN: -Add low dose gabapentin during day (300 mg, 300 mg, 900 mg) -Stretches -RTC 3 weeks for possible steroid injection if symptoms not improved/worsening. Dicussed how evidence on symptomatic improvement with this is not clear.

## 2012-04-02 NOTE — Progress Notes (Signed)
  Subjective:    Patient ID: Brittany Burton, female    DOB: [birth year], 56 y.o.   MRN: [id number]  HPI Follow-up of right meralgia paresthetica (compression of right femoral nerve) diagnosed 04/10.  Patient has been taking gabapentin 1200 mg at nighttime. Helps her nighttime symptoms but daytime symptoms worsening. Now she is having almost constant right lateral thigh numbness that is worse when she is standing up for prolonged periods of time (e.g., at cocktail parties).   Review of Systems    Objective:   Physical Exam Gen: NAD MSK:   Right hip:     Inspection: normal     Palpation: non-tender, including right inguinal area and thigh     Sensation: decreased right lateral thigh     Strength: intact hip flexion/extension/abduction/adduction     ROM: intact     Maneuvers: Faber test decreased on right compared to left (70 versus 60 deg); pretzel stretches do not elicit symptoms or pain  Ultrasound Marked area of hypoechogenicity around right femoral nerve consistent w fibrous thickening Compared to left femoral nerve this is much larger This sits directly under the inguinal ligament      Assessment & Plan:

## 2012-04-02 NOTE — Patient Instructions (Signed)
Meralgia paresthetica: trapping/irritation of lateral femoral cutaneous nerve  Take gabapentin: -Take 1 tablet after getting home -Take 3 tablets at nighttime Then over weekend: -900 mg at nighttime -300 mg in the morning -300 mg at noon  Do the "pretzel" stretches on both sides.   Avoid wearing anything tight along upper thighs (panties, shorts, etc.).   Call after 3 weeks if you are not better to schedule possible steroid injection.

## 2012-04-03 ENCOUNTER — Telehealth: Payer: Self-pay | Admitting: *Deleted

## 2012-04-03 NOTE — Telephone Encounter (Signed)
Message copied by Mora Bellman on Tue Apr 03, 2012  9:19 AM ------      Message from: Enid Baas      Created: Mon Apr 02, 2012  8:05 PM       Let patient know that we were able to review her [place] images and this does appear consistent with meralgia paresthetica and the image is pretty much diagnostic proof.            We really do need to push the gabapentin in this case but might want to switch this if no response by 3 weeks as we noted in office

## 2012-04-03 NOTE — Telephone Encounter (Signed)
Spoke with pt- gave her message from Dr. Darrick Penna.  She will continue with gabapentin and let us know how she is doing in 3 weeks.

## 2012-05-04 ENCOUNTER — Encounter: Payer: Self-pay | Admitting: *Deleted

## 2012-05-14 ENCOUNTER — Encounter: Payer: Self-pay | Admitting: Gynecology

## 2012-05-14 ENCOUNTER — Ambulatory Visit (INDEPENDENT_AMBULATORY_CARE_PROVIDER_SITE_OTHER): Payer: 59 | Admitting: Gynecology

## 2012-05-14 VITALS — BP 120/74 | Ht 64.5 in | Wt 164.0 lb

## 2012-05-14 DIAGNOSIS — Z01419 Encounter for gynecological examination (general) (routine) without abnormal findings: Secondary | ICD-10-CM

## 2012-05-14 DIAGNOSIS — E559 Vitamin D deficiency, unspecified: Secondary | ICD-10-CM

## 2012-05-14 NOTE — Patient Instructions (Signed)
Office will call you with the vitamin D level.   Consider Stop Smoking.  Help is available at [facility] smoking cessation program @ [email or web address] or 765-826-7202. OR 1-800-QUIT-NOW 938-731-7009) for free smoking cessation counseling.   Smoking Hazards Smoking cigarettes is extremely bad for your health. Tobacco smoke has over 200 known poisons in it. There are over 60 chemicals in tobacco smoke that cause cancer. Some of the chemicals found in cigarette smoke include:  Cyanide.  Benzene.  Formaldehyde.  Methanol (wood alcohol).  Acetylene (fuel used in welding torches).  Ammonia.  Cigarette smoke also contains the poisonous gases nitrogen oxide and carbon monoxide.  Cigarette smokers have an increased risk of many serious medical problems, including: Lung cancer.  Lung disease (such as pneumonia, bronchitis, and emphysema).  Heart attack and chest pain due to the heart not getting enough oxygen (angina).  Heart disease and peripheral blood vessel disease.  Hypertension.  Stroke.  Oral cancer (cancer of the lip, mouth, or voice box).  Bladder cancer.  Pancreatic cancer.  Cervical cancer.  Pregnancy complications, including premature birth.  Low birthweight babies.  Early menopause.  Lower estrogen level for women.  Infertility.  Facial wrinkles.  Blindness.  Increased risk of broken bones (fractures).  Senile dementia.  Stillbirths and smaller newborn babies, birth defects, and genetic damage to sperm.  Stomach ulcers and internal bleeding.  Children of smokers have an increased risk of the following, because of secondhand smoke exposure:  Sudden infant death syndrome (SIDS).  Respiratory infections.  Lung cancer.  Heart disease.  Ear infections.  Smoking causes approximately: 90% of all lung cancer deaths in men.  80% of all lung cancer deaths in women.  90% of deaths from chronic obstructive lung disease.  Compared with nonsmokers, smoking increases  the risk of: Coronary heart disease by 2 to 4 times.  Stroke by 2 to 4 times.  Men developing lung cancer by 23 times.  Women developing lung cancer by 13 times.  Dying from chronic obstructive lung diseases by 12 times.  Someone who smokes 2 packs a day loses about 8 years of his or her life. Even smoking lightly shortens your life expectancy by several years. You can greatly reduce the risk of medical problems for you and your family by stopping now. Smoking is the most preventable cause of death and disease in our society. Within days of quitting smoking, your circulation returns to normal, you decrease the risk of having a heart attack, and your lung capacity improves. There may be some increased phlegm in the first few days after quitting, and it may take months for your lungs to clear up completely. Quitting for 10 years cuts your lung cancer risk to almost that of a nonsmoker. WHY IS SMOKING ADDICTIVE? Nicotine is the chemical agent in tobacco that is capable of causing addiction or dependence.  When you smoke and inhale, nicotine is absorbed rapidly into the bloodstream through your lungs. Nicotine absorbed through the lungs is capable of creating a powerful addiction. Both inhaled and non-inhaled nicotine may be addictive.  Addiction studies of cigarettes and spit tobacco show that addiction to nicotine occurs mainly during the teen years, when young people begin using tobacco products.  WHAT ARE THE BENEFITS OF QUITTING?  There are many health benefits to quitting smoking.  Likelihood of developing cancer and heart disease decreases. Health improvements are seen almost immediately.  Blood pressure, pulse rate, and breathing patterns start returning to normal soon after quitting.  People who quit may see an improvement in their overall quality of life.  Some people choose to quit all at once. Other options include nicotine replacement products, such as patches, gum, and nasal sprays. Do not  use these products without first checking with your caregiver. QUITTING SMOKING It is not easy to quit smoking. Nicotine is addicting, and longtime habits are hard to change. To start, you can write down all your reasons for quitting, tell your family and friends you want to quit, and ask for their help. Throw your cigarettes away, chew gum or cinnamon sticks, keep your hands busy, and drink extra water or juice. Go for walks and practice deep breathing to relax. Think of all the money you are saving: around $1,000 a year, for the average pack-a-day smoker. Nicotine patches and gum have been shown to improve success at efforts to stop smoking. Zyban (bupropion) is an anti-depressant drug that can be prescribed to reduce nicotine withdrawal symptoms and to suppress the urge to smoke. Smoking is an addiction with both physical and psychological effects. Joining a stop-smoking support group can help you cope with the emotional issues. For more information and advice on programs to stop smoking, call your doctor, your local hospital, or these organizations: American Lung Association - 1-800-LUNGUSA  American Cancer Society - 1-800-ACS-2345  Document Released: 12/15/2004 Document Revised: 07/20/2011 Document Reviewed: 08/19/2009 [facility] Patient Information 2012 [place], [place].  Smoking Cessation This document explains the best ways for you to quit smoking and new treatments to help. It lists new medicines that can double or triple your chances of quitting and quitting for good. It also considers ways to avoid relapses and concerns you may have about quitting, including weight gain. NICOTINE: A POWERFUL ADDICTION If you have tried to quit smoking, you know how hard it can be. It is hard because nicotine is a very addictive drug. For some people, it can be as addictive as heroin or cocaine. Usually, people make 2 or 3 tries, or more, before finally being able to quit. Each time you try to quit, you can learn  about what helps and what hurts. Quitting takes hard work and a lot of effort, but you can quit smoking. QUITTING SMOKING IS ONE OF THE MOST IMPORTANT THINGS YOU WILL EVER DO.  You will live longer, feel better, and live better.   The impact on your body of quitting smoking is felt almost immediately:   Within 20 minutes, blood pressure decreases. Pulse returns to its normal level.   After 8 hours, carbon monoxide levels in the blood return to normal. Oxygen level increases.   After 24 hours, chance of heart attack starts to decrease. Breath, hair, and body stop smelling like smoke.   After 48 hours, damaged nerve endings begin to recover. Sense of taste and smell improve.   After 72 hours, the body is virtually free of nicotine. Bronchial tubes relax and breathing becomes easier.   After 2 to 12 weeks, lungs can hold more air. Exercise becomes easier and circulation improves.   Quitting will reduce your risk of having a heart attack, stroke, cancer, or lung disease:   After 1 year, the risk of coronary heart disease is cut in half.   After 5 years, the risk of stroke falls to the same as a nonsmoker.   After 10 years, the risk of lung cancer is cut in half and the risk of other cancers decreases significantly.   After 15 years, the risk of  coronary heart disease drops, usually to the level of a nonsmoker.   If you are pregnant, quitting smoking will improve your chances of having a healthy baby.   The people you live with, especially your children, will be healthier.   You will have extra money to spend on things other than cigarettes.  FIVE KEYS TO QUITTING Studies have shown that these 5 steps will help you quit smoking and quit for good. You have the best chances of quitting if you use them together: 1. Get ready.  2. Get support and encouragement.  3. Learn new skills and behaviors.  4. Get medicine to reduce your nicotine addiction and use it correctly.  5. Be prepared  for relapse or difficult situations. Be determined to continue trying to quit, even if you do not succeed at first.  1. GET READY  Set a quit date.   Change your environment.   Get rid of ALL cigarettes, ashtrays, matches, and lighters in your home, car, and place of work.   Do not let people smoke in your home.   Review your past attempts to quit. Think about what worked and what did not.   Once you quit, do not smoke. NOT EVEN A PUFF!  2. GET SUPPORT AND ENCOURAGEMENT Studies have shown that you have a better chance of being successful if you have help. You can get support in many ways.  Tell your family, friends, and coworkers that you are going to quit and need their support. Ask them not to smoke around you.   Talk to your caregivers (doctor, dentist, nurse, pharmacist, psychologist, and/or smoking counselor).   Get individual, group, or telephone counseling and support. The more counseling you have, the better your chances are of quitting. Programs are available at [employer] and health centers. Call your local health department for information about programs in your area.   Spiritual beliefs and practices may help some smokers quit.   Quit meters are Photographer that keep track of quit statistics, such as amount of "quit-time," cigarettes not smoked, and money saved.   Many smokers find one or more of the many self-help books available useful in helping them quit and stay off tobacco.  3. LEARN NEW SKILLS AND BEHAVIORS  Try to distract yourself from urges to smoke. Talk to someone, go for a walk, or occupy your time with a task.   When you first try to quit, change your routine. Take a different route to work. Drink tea instead of coffee. Eat breakfast in a different place.   Do something to reduce your stress. Take a hot bath, exercise, or read a book.   Plan something enjoyable to do every day. Reward yourself for not smoking.    Explore interactive web-based programs that specialize in helping you quit.  4. GET MEDICINE AND USE IT CORRECTLY Medicines can help you stop smoking and decrease the urge to smoke. Combining medicine with the above behavioral methods and support can quadruple your chances of successfully quitting smoking. The U.S. Food and Drug Administration (FDA) has approved 7 medicines to help you quit smoking. These medicines fall into 3 categories.  Nicotine replacement therapy (delivers nicotine to your body without the negative effects and risks of smoking):   Nicotine gum: Available over-the-counter.   Nicotine lozenges: Available over-the-counter.   Nicotine inhaler: Available by prescription.   Nicotine nasal spray: Available by prescription.   Nicotine skin patches (transdermal): Available by prescription and  over-the-counter.   Antidepressant medicine (helps people abstain from smoking, but how this works is unknown):   Bupropion sustained-release (SR) tablets: Available by prescription.   Nicotinic receptor partial agonist (simulates the effect of nicotine in your brain):   Varenicline tartrate tablets: Available by prescription.   Ask your caregiver for advice about which medicines to use and how to use them. Carefully read the information on the package.   Everyone who is trying to quit may benefit from using a medicine. If you are pregnant or trying to become pregnant, nursing an infant, you are under age 85, or you smoke fewer than 10 cigarettes per day, talk to your caregiver before taking any nicotine replacement medicines.   You should stop using a nicotine replacement product and call your caregiver if you experience nausea, dizziness, weakness, vomiting, fast or irregular heartbeat, mouth problems with the lozenge or gum, or redness or swelling of the skin around the patch that does not go away.   Do not use any other product containing nicotine while using a nicotine  replacement product.   Talk to your caregiver before using these products if you have diabetes, heart disease, asthma, stomach ulcers, you had a recent heart attack, you have high blood pressure that is not controlled with medicine, a history of irregular heartbeat, or you have been prescribed medicine to help you quit smoking.  5. BE PREPARED FOR RELAPSE OR DIFFICULT SITUATIONS  Most relapses occur within the first 3 months after quitting. Do not be discouraged if you start smoking again. Remember, most people try several times before they finally quit.   You may have symptoms of withdrawal because your body is used to nicotine. You may crave cigarettes, be irritable, feel very hungry, cough often, get headaches, or have difficulty concentrating.   The withdrawal symptoms are only temporary. They are strongest when you first quit, but they will go away within 10 to 14 days.  Here are some difficult situations to watch for:  Alcohol. Avoid drinking alcohol. Drinking lowers your chances of successfully quitting.   Caffeine. Try to reduce the amount of caffeine you consume. It also lowers your chances of successfully quitting.   Other smokers. Being around smoking can make you want to smoke. Avoid smokers.   Weight gain. Many smokers will gain weight when they quit, usually less than 10 pounds. Eat a healthy diet and stay active. Do not let weight gain distract you from your main goal, quitting smoking. Some medicines that help you quit smoking may also help delay weight gain. You can always lose the weight gained after you quit.   Bad mood or depression. There are a lot of ways to improve your mood other than smoking.  If you are having problems with any of these situations, talk to your caregiver. SPECIAL SITUATIONS AND CONDITIONS Studies suggest that everyone can quit smoking. Your situation or condition can give you a special reason to quit.  Pregnant women/new mothers: By quitting, you  protect your baby's health and your own.   Hospitalized patients: By quitting, you reduce health problems and help healing.   Heart attack patients: By quitting, you reduce your risk of a second heart attack.   Lung, head, and neck cancer patients: By quitting, you reduce your chance of a second cancer.   Parents of children and adolescents: By quitting, you protect your children from illnesses caused by secondhand smoke.  QUESTIONS TO THINK ABOUT Think about the following questions before you try  to stop smoking. You may want to talk about your answers with your caregiver.  Why do you want to quit?   If you tried to quit in the past, what helped and what did not?   What will be the most difficult situations for you after you quit? How will you plan to handle them?   Who can help you through the tough times? Your family? Friends? Caregiver?   What pleasures do you get from smoking? What ways can you still get pleasure if you quit?  Here are some questions to ask your caregiver:  How can you help me to be successful at quitting?   What medicine do you think would be best for me and how should I take it?   What should I do if I need more help?   What is smoking withdrawal like? How can I get information on withdrawal?  Quitting takes hard work and a lot of effort, but you can quit smoking. FOR MORE INFORMATION  Smokefree.gov (http://www.davis-sullivan.com/) provides free, accurate, evidence-based information and professional assistance to help support the immediate and long-term needs of people trying to quit smoking. Document Released: 11/01/2001 Document Revised: 07/20/2011 Document Reviewed: 08/24/2009 [facility] Patient Information 2012 [place], [place].

## 2012-05-14 NOTE — Progress Notes (Addendum)
Brittany Burton 17-Aug-1956 [id number]        56 y.o.  for annual exam.  Overall doing well.  Past medical history,surgical history, medications, allergies, family history and social history were all reviewed and documented in the EPIC chart. ROS:  Was performed and pertinent positives and negatives are included in the history.  Exam: Kim Asst. present Filed Vitals:   05/14/12 1157  BP: 120/74   General appearance  Normal Skin grossly normal Head/Neck normal with no cervical or supraclavicular adenopathy thyroid normal Lungs  clear Cardiac RR, without RMG Abdominal  soft, nontender, without masses, organomegaly or hernia Breasts  examined lying and sitting without masses, retractions, discharge or axillary adenopathy. Pelvic  Ext/BUS/vagina  normal   Cervix  normal   Uterus  anteverted, normal size, shape and contour, midline and mobile nontender   Adnexa  Without masses or tenderness    Anus and perineum  normal   Rectovaginal  normal sphincter tone without palpated masses or tenderness.    Assessment/Plan:  56 y.o. female for annual exam.    1. Postmenopausal doing well without bleeding or significant symptoms. We'll continue to monitor. 2. Pap smear. Last Pap smear 2012. No Pap smear done today. Patient has no history of abnormal Pap smears with numerous normal reports in her chart. We'll plan every 3-5 year screening. 3. Mammography. Patient just had her mammogram this morning. We'll continue with annual mammography. SBE monthly reviewed. 4. Colonoscopy. Patient is up-to-date with her colon screening and will follow up per their recommended interval. 5. Bone density. Patient had a normal DEXA 03/2010. We'll plan to follow up at a five-year interval.  She has been noted to be low on vitamin D and a vitamin D level checked today. 6. Stop smoking. I again encouraged her to stop smoking as has Dr. Jonny Ruiz. Patient acknowledges our recommendation. 7. Health maintenance. No other blood  work was done as it is all done through Dr. Raphael Gibney office who sees her routinely. Assuming she continues well from a gynecologic standpoint she will see me in a year, sooner as needed.    Dara Lords MD, 12:29 PM 05/14/2012

## 2012-05-15 LAB — URINALYSIS W MICROSCOPIC + REFLEX CULTURE
Bilirubin Urine: NEGATIVE
Casts: NONE SEEN
Glucose, UA: NEGATIVE mg/dL
Hgb urine dipstick: NEGATIVE
Ketones, ur: NEGATIVE mg/dL
Leukocytes, UA: NEGATIVE
pH: 5.5 (ref 5.0–8.0)

## 2012-05-15 LAB — VITAMIN D 25 HYDROXY (VIT D DEFICIENCY, FRACTURES): Vit D, 25-Hydroxy: 27 ng/mL — ABNORMAL LOW (ref 30–89)

## 2012-05-16 ENCOUNTER — Telehealth: Payer: Self-pay | Admitting: Gynecology

## 2012-05-16 MED ORDER — SULFAMETHOXAZOLE-TRIMETHOPRIM 800-160 MG PO TABS: 1.0000 | ORAL_TABLET | Freq: Two times a day (BID) | ORAL | Status: AC

## 2012-05-16 NOTE — Telephone Encounter (Signed)
Tell patient that the urine grew out a low level of bacteria. I want to cover her with Septra DS one by mouth twice a day x3 days.

## 2012-05-17 ENCOUNTER — Other Ambulatory Visit: Payer: Self-pay | Admitting: *Deleted

## 2012-05-17 ENCOUNTER — Other Ambulatory Visit: Payer: Self-pay | Admitting: Gynecology

## 2012-05-17 DIAGNOSIS — Z09 Encounter for follow-up examination after completed treatment for conditions other than malignant neoplasm: Secondary | ICD-10-CM

## 2012-05-17 LAB — URINE CULTURE: Colony Count: 15000

## 2012-05-17 NOTE — Telephone Encounter (Signed)
Pt informed with the below note. 

## 2012-05-21 ENCOUNTER — Encounter: Payer: Self-pay | Admitting: Gynecology

## 2013-01-28 ENCOUNTER — Other Ambulatory Visit (INDEPENDENT_AMBULATORY_CARE_PROVIDER_SITE_OTHER): Payer: 59

## 2013-01-28 DIAGNOSIS — Z0000 Encounter for general adult medical examination without abnormal findings: Secondary | ICD-10-CM

## 2013-01-28 LAB — HEPATIC FUNCTION PANEL
ALT: 61 U/L — ABNORMAL HIGH (ref 0–35)
AST: 50 U/L — ABNORMAL HIGH (ref 0–37)
Bilirubin, Direct: 0.1 mg/dL (ref 0.0–0.3)
Total Bilirubin: 0.6 mg/dL (ref 0.3–1.2)

## 2013-01-28 LAB — LIPID PANEL
HDL: 44 mg/dL (ref >39.00)
Total CHOL/HDL Ratio: 5
VLDL: 10.4 mg/dL (ref 0.0–40.0)

## 2013-01-28 LAB — CBC WITH DIFFERENTIAL/PLATELET
Basophils Relative: 0.6 % (ref 0.0–3.0)
Eosinophils Relative: 1.8 % (ref 0.0–5.0)
Hemoglobin: 14 g/dL (ref 12.0–15.0)
Lymphocytes Relative: 34.4 % (ref 12.0–46.0)
MCHC: 34.3 g/dL (ref 30.0–36.0)
Monocytes Relative: 8.4 % (ref 3.0–12.0)
Neutro Abs: 3.4 10*3/uL (ref 1.4–7.7)
RBC: 4.32 Mil/uL (ref 3.87–5.11)

## 2013-01-28 LAB — URINALYSIS, ROUTINE W REFLEX MICROSCOPIC
Ketones, ur: NEGATIVE
Specific Gravity, Urine: >=1.03 (ref 1.000–1.030)
Urine Glucose: NEGATIVE
Urobilinogen, UA: 0.2 (ref 0.0–1.0)

## 2013-01-28 LAB — BASIC METABOLIC PANEL
GFR: 83.49 mL/min (ref >60.00)
Potassium: 5.3 mEq/L — ABNORMAL HIGH (ref 3.5–5.1)
Sodium: 143 mEq/L (ref 135–145)

## 2013-01-28 LAB — TSH: TSH: 4.11 u[IU]/mL (ref 0.35–5.50)

## 2013-01-31 ENCOUNTER — Ambulatory Visit (INDEPENDENT_AMBULATORY_CARE_PROVIDER_SITE_OTHER): Payer: 59

## 2013-01-31 ENCOUNTER — Ambulatory Visit (INDEPENDENT_AMBULATORY_CARE_PROVIDER_SITE_OTHER): Payer: 59 | Admitting: Internal Medicine

## 2013-01-31 ENCOUNTER — Encounter: Payer: Self-pay | Admitting: Internal Medicine

## 2013-01-31 VITALS — BP 130/80 | HR 69 | Temp 97.1°F | Ht 65.0 in | Wt 177.4 lb

## 2013-01-31 DIAGNOSIS — R945 Abnormal results of liver function studies: Secondary | ICD-10-CM | POA: Insufficient documentation

## 2013-01-31 DIAGNOSIS — Z0000 Encounter for general adult medical examination without abnormal findings: Secondary | ICD-10-CM

## 2013-01-31 DIAGNOSIS — R7989 Other specified abnormal findings of blood chemistry: Secondary | ICD-10-CM

## 2013-01-31 DIAGNOSIS — F17200 Nicotine dependence, unspecified, uncomplicated: Secondary | ICD-10-CM

## 2013-01-31 LAB — IBC PANEL: Iron: 57 ug/dL (ref 42–145)

## 2013-01-31 LAB — HEPATIC FUNCTION PANEL
ALT: 52 U/L — ABNORMAL HIGH (ref 0–35)
AST: 53 U/L — ABNORMAL HIGH (ref 0–37)
Albumin: 3.9 g/dL (ref 3.5–5.2)
Total Bilirubin: 0.8 mg/dL (ref 0.3–1.2)
Total Protein: 7 g/dL (ref 6.0–8.3)

## 2013-01-31 MED ORDER — ATORVASTATIN CALCIUM 20 MG PO TABS: 20.0000 mg | ORAL_TABLET | Freq: Every day | ORAL | Status: DC

## 2013-01-31 MED ORDER — ASPIRIN 81 MG PO TBEC: 81.0000 mg | DELAYED_RELEASE_TABLET | Freq: Every day | ORAL | Status: AC

## 2013-01-31 MED ORDER — MECLIZINE HCL 25 MG PO TABS: 25.0000 mg | ORAL_TABLET | Freq: Every day | ORAL | Status: DC | PRN

## 2013-01-31 NOTE — Assessment & Plan Note (Signed)
Urged to quit 

## 2013-01-31 NOTE — Assessment & Plan Note (Signed)
Mild persistent, etiology unclear, asympt but will need u/s and lab eval, ok to continue if proves to be fatty liver or possibly due to lipitor

## 2013-01-31 NOTE — Progress Notes (Signed)
Subjective:    Patient ID: Brittany Burton, female    DOB: January 06, 1956, 57 y.o.   MRN: [id number]  HPI  Here for wellness and f/u;  Overall doing ok;  Pt denies CP, worsening SOB, DOE, wheezing, orthopnea, PND, worsening LE edema, palpitations, dizziness or syncope.  Pt denies neurological change such as new headache, facial or extremity weakness.  Pt denies polydipsia, polyuria, or low sugar symptoms. Pt states overall good compliance with treatment and medications, good tolerability, and has been trying to follow lower cholesterol diet.  Pt denies worsening depressive symptoms, suicidal ideation or panic. No fever, night sweats, wt loss, loss of appetite, or other constitutional symptoms.  Pt states good ability with ADL's, has low fall risk, home safety reviewed and adequate, no other significant changes in hearing or vision, and only occasionally active with exercise. 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 Denies worsening reflux, abd pain, dysphagia, n/v, bowel change or blood. Past Medical History  Diagnosis Date  . VITAMIN D DEFICIENCY 06/03/2009  . HYPERLIPIDEMIA 06/03/2009  . GERD 06/03/2009  . POSTURAL LIGHTHEADEDNESS 06/17/2010  . LEG PAIN, RIGHT 03/18/2010  . BACK PAIN 03/18/2010  . Vertigo     chronic recurrent, BPV, probl left related  . PAF (paroxysmal atrial fibrillation) 09/04/2011   No past surgical history on file.  reports that she has been smoking Cigarettes.  She has a 30 pack-year smoking history. She does not have any smokeless tobacco history on file. She reports that she does not drink alcohol or use illicit drugs. family history includes Cancer in her paternal grandmother; Cancer (age of onset: 11) in her sister; and Melanoma in her father and sister. No Known Allergies Current Outpatient Prescriptions on File Prior to Visit  Medication Sig Dispense Refill  . pantoprazole (PROTONIX) 40 MG tablet Take 1 tablet (40 mg total)  by mouth daily.  90 tablet  3   No current facility-administered medications on file prior to visit.   Review of Systems Constitutional: Negative for diaphoresis, activity change, appetite change or unexpected weight change.  HENT: Negative for hearing loss, ear pain, facial swelling, mouth sores and neck stiffness.   Eyes: Negative for pain, redness and visual disturbance.  Respiratory: Negative for shortness of breath and wheezing.   Cardiovascular: Negative for chest pain and palpitations.  Gastrointestinal: Negative for diarrhea, blood in stool, abdominal distention or other pain Genitourinary: Negative for hematuria, flank pain or change in urine volume.  Musculoskeletal: Negative for myalgias and joint swelling.  Skin: Negative for color change and wound.  Neurological: Negative for syncope and numbness. other than noted Hematological: Negative for adenopathy.  Psychiatric/Behavioral: Negative for hallucinations, self-injury, decreased concentration and agitation.      Objective:   Physical Exam BP 130/80  Pulse 69  Temp(Src) 97.1 F (36.2 C) (Oral)  Ht 5\' 5"  (1.651 m)  Wt 177 lb 6 oz (80.457 kg)  BMI 29.52 kg/m2  SpO2 98% VS noted,  Constitutional: Pt is oriented to person, place, and time. Appears well-developed and well-nourished.  Head: Normocephalic and atraumatic.  Right Ear: External ear normal.  Left Ear: External ear normal.  Nose: Nose normal.  Mouth/Throat: Oropharynx is clear and moist.  Eyes: Conjunctivae and EOM are normal. Pupils are equal, round, and reactive to light.  Neck: Normal range of motion. Neck supple. No JVD present. No tracheal deviation present.  Cardiovascular: Normal rate, regular rhythm, normal heart sounds and intact distal pulses.  Pulmonary/Chest: Effort normal and breath sounds normal.  Abdominal: Soft. Bowel sounds are normal. There is no tenderness. No HSM  Musculoskeletal: Normal range of motion. Exhibits no edema.   Lymphadenopathy:  Has no cervical adenopathy.  Neurological: Pt is alert and oriented to person, place, and time. Pt has normal reflexes. No cranial nerve deficit.  Skin: Skin is warm and dry. No rash noted.  Psychiatric:  Has  normal mood and affect. Behavior is normal.     Assessment & Plan:

## 2013-01-31 NOTE — Patient Instructions (Addendum)
Please continue all other medications as before, and refills have been done if requested. You are given the shingles shot prescription You will be contacted regarding the referral for: abdomen ultrasound to check the liver Please go to the LAB in the Basement (turn left off the elevator) for the tests to be done today You will be contacted by phone if any changes need to be made immediately.  Otherwise, you will receive a letter about your results with an explanation, but please check with MyChart first. Thank you for enrolling in MyChart. Please follow the instructions below to securely access your online medical record. MyChart allows you to send messages to your doctor, view your test results, renew your prescriptions, schedule appointments, and more. To Log into My Chart online, please go by [employer] or [employer] to [email or web address], or download the MyChart App from the [employer] .  Your Username is:  Brittany Burton (pass barney) Please send a Engineer, water on Mychart later today. Please return in 1 year for your yearly visit, or sooner if needed, with Lab testing done 3-5 days before

## 2013-01-31 NOTE — Assessment & Plan Note (Signed)

## 2013-02-01 LAB — ANA: Anti Nuclear Antibody(ANA): POSITIVE — AB

## 2013-02-01 LAB — CERULOPLASMIN: Ceruloplasmin: 31 mg/dL (ref 20–60)

## 2013-02-01 LAB — HEPATITIS PANEL, ACUTE
HCV Ab: NEGATIVE
Hep A IgM: NEGATIVE

## 2013-02-01 LAB — ANTI-NUCLEAR AB-TITER (ANA TITER): ANA Titer 1: NEGATIVE

## 2013-02-07 ENCOUNTER — Other Ambulatory Visit: Payer: Self-pay | Admitting: Dermatology

## 2013-02-08 ENCOUNTER — Other Ambulatory Visit: Payer: 59

## 2013-02-13 ENCOUNTER — Ambulatory Visit
Admission: RE | Admit: 2013-02-13 | Discharge: 2013-02-13 | Disposition: A | Discharge status: Home / Self Care | Payer: 59 | Source: Ambulatory Visit | Attending: Internal Medicine | Admitting: Internal Medicine

## 2013-05-16 ENCOUNTER — Encounter: Payer: Self-pay | Admitting: Gynecology

## 2013-05-16 ENCOUNTER — Ambulatory Visit (INDEPENDENT_AMBULATORY_CARE_PROVIDER_SITE_OTHER): Payer: 59 | Admitting: Gynecology

## 2013-05-16 VITALS — BP 120/78 | Ht 65.0 in | Wt 179.0 lb

## 2013-05-16 DIAGNOSIS — Z01419 Encounter for gynecological examination (general) (routine) without abnormal findings: Secondary | ICD-10-CM

## 2013-05-16 NOTE — Progress Notes (Signed)
Brittany Burton [birth year] [id number]        57 y.o.  G0P0 for annual exam.  Doing well without complaints.  Past medical history,surgical history, medications, allergies, family history and social history were all reviewed and documented in the EPIC chart.  ROS:  Performed and pertinent positives and negatives are included in the history, assessment and plan .  Exam: Kim assistant Filed Vitals:   05/16/13 1141  BP: 120/78  Height: 5\' 5"  (1.651 m)  Weight: 179 lb (81.194 kg)   General appearance  Normal Skin grossly normal Head/Neck normal with no cervical or supraclavicular adenopathy thyroid normal Lungs  clear Cardiac RR, without RMG Abdominal  soft, nontender, without masses, organomegaly or hernia Breasts  examined lying and sitting without masses, retractions, discharge or axillary adenopathy. Pelvic  Ext/BUS/vagina  normal with mild atrophic changes  Cervix  normal with atrophic changes  Uterus  anteverted, normal size, shape and contour, midline and mobile nontender   Adnexa  Without masses or tenderness    Anus and perineum  normal   Rectovaginal  normal sphincter tone without palpated masses or tenderness.    Assessment/Plan:  57 y.o. G0P0 female for annual exam.   1. Postmenopausal. Patient without significant symptoms such as hot flashes, night sweats, vaginal dryness or dyspareunia. No vaginal bleeding. We'll continue to monitor. Patient does report any bleeding or other symptoms. 2. Pap smear 2012. No Pap smear done today. No history of abnormal Pap smears. Plan repeat next year at 3 year interval. 3. Mammography scheduled next week. Continue with annual mammography. SBE monthly reviewed. 4. Colonoscopy 2008 with planned repeat at 10 year interval. 5. DEXA 2011 normal. Repeat at 5 year interval. Increase calcium vitamin D reviewed. 6. Stop smoking again discussed with her. 7. Health maintenance. No blood work done as it is all done through her primary  physician's office. Followup one year, sooner as needed.    Dara Lords MD, 12:03 PM 05/16/2013

## 2013-05-16 NOTE — Patient Instructions (Signed)
Consider Stop Smoking.  Help is available at [facility]'s smoking cessation program @ www.[facility].com or 336-832-0838. OR 1-800-QUIT-NOW (1-800-784-8669) for free smoking cessation counseling.  Smokefree.gov (http://www.smokefree.gov) provides free, accurate, evidence-based information and professional assistance to help support the immediate and long-term needs of people trying to quit smoking.    Smoking Hazards Smoking cigarettes is extremely bad for your health. Tobacco smoke has over 200 known poisons in it. There are over 60 chemicals in tobacco smoke that cause cancer. Some of the chemicals found in cigarette smoke include:  Cyanide.  Benzene.  Formaldehyde.  Methanol (wood alcohol).  Acetylene (fuel used in welding torches).  Ammonia.  Cigarette smoke also contains the poisonous gases nitrogen oxide and carbon monoxide.  Cigarette smokers have an increased risk of many serious medical problems, including: Lung cancer.  Lung disease (such as pneumonia, bronchitis, and emphysema).  Heart attack and chest pain due to the heart not getting enough oxygen (angina).  Heart disease and peripheral blood vessel disease.  Hypertension.  Stroke.  Oral cancer (cancer of the lip, mouth, or voice box).  Bladder cancer.  Pancreatic cancer.  Cervical cancer.  Pregnancy complications, including premature birth.  Low birthweight babies.  Early menopause.  Lower estrogen level for women.  Infertility.  Facial wrinkles.  Blindness.  Increased risk of broken bones (fractures).  Senile dementia.  Stillbirths and smaller newborn babies, birth defects, and genetic damage to sperm.  Stomach ulcers and internal bleeding.  Children of smokers have an increased risk of the following, because of secondhand smoke exposure:  Sudden infant death syndrome (SIDS).  Respiratory infections.  Lung cancer.  Heart disease.  Ear infections.  Smoking causes approximately: 90% of all lung cancer  deaths in men.  80% of all lung cancer deaths in women.  90% of deaths from chronic obstructive lung disease.  Compared with nonsmokers, smoking increases the risk of: Coronary heart disease by 2 to 4 times.  Stroke by 2 to 4 times.  Men developing lung cancer by 23 times.  Women developing lung cancer by 13 times.  Dying from chronic obstructive lung diseases by 12 times.  Someone who smokes 2 packs a day loses about 8 years of his or her life. Even smoking lightly shortens your life expectancy by several years. You can greatly reduce the risk of medical problems for you and your family by stopping now. Smoking is the most preventable cause of death and disease in our society. Within days of quitting smoking, your circulation returns to normal, you decrease the risk of having a heart attack, and your lung capacity improves. There may be some increased phlegm in the first few days after quitting, and it may take months for your lungs to clear up completely. Quitting for 10 years cuts your lung cancer risk to almost that of a nonsmoker. WHY IS SMOKING ADDICTIVE? Nicotine is the chemical agent in tobacco that is capable of causing addiction or dependence.  When you smoke and inhale, nicotine is absorbed rapidly into the bloodstream through your lungs. Nicotine absorbed through the lungs is capable of creating a powerful addiction. Both inhaled and non-inhaled nicotine may be addictive.  Addiction studies of cigarettes and spit tobacco show that addiction to nicotine occurs mainly during the teen years, when young people begin using tobacco products.  WHAT ARE THE BENEFITS OF QUITTING?  There are many health benefits to quitting smoking.  Likelihood of developing cancer and heart disease decreases. Health improvements are seen almost immediately.    Blood pressure, pulse rate, and breathing patterns start returning to normal soon after quitting.  People who quit may see an improvement in their overall  quality of life.  Some people choose to quit all at once. Other options include nicotine replacement products, such as patches, gum, and nasal sprays. Do not use these products without first checking with your caregiver. QUITTING SMOKING It is not easy to quit smoking. Nicotine is addicting, and longtime habits are hard to change. To start, you can write down all your reasons for quitting, tell your family and friends you want to quit, and ask for their help. Throw your cigarettes away, chew gum or cinnamon sticks, keep your hands busy, and drink extra water or juice. Go for walks and practice deep breathing to relax. Think of all the money you are saving: around $1,000 a year, for the average pack-a-day smoker. Nicotine patches and gum have been shown to improve success at efforts to stop smoking. Zyban (bupropion) is an anti-depressant drug that can be prescribed to reduce nicotine withdrawal symptoms and to suppress the urge to smoke. Smoking is an addiction with both physical and psychological effects. Joining a stop-smoking support group can help you cope with the emotional issues. For more information and advice on programs to stop smoking, call your doctor, your local hospital, or these organizations: American Lung Association - 1-800-LUNGUSA   Smoking Cessation  This document explains the best ways for you to quit smoking and new treatments to help. It lists new medicines that can double or triple your chances of quitting and quitting for good. It also considers ways to avoid relapses and concerns you may have about quitting, including weight gain. NICOTINE: A POWERFUL ADDICTION If you have tried to quit smoking, you know how hard it can be. It is hard because nicotine is a very addictive drug. For some people, it can be as addictive as heroin or cocaine. Usually, people make 2 or 3 tries, or more, before finally being able to quit. Each time you try to quit, you can learn about what helps and  what hurts. Quitting takes hard work and a lot of effort, but you can quit smoking. QUITTING SMOKING IS ONE OF THE MOST IMPORTANT THINGS YOU WILL EVER DO.  You will live longer, feel better, and live better.   The impact on your body of quitting smoking is felt almost immediately:   Within 20 minutes, blood pressure decreases. Pulse returns to its normal level.   After 8 hours, carbon monoxide levels in the blood return to normal. Oxygen level increases.   After 24 hours, chance of heart attack starts to decrease. Breath, hair, and body stop smelling like smoke.   After 48 hours, damaged nerve endings begin to recover. Sense of taste and smell improve.   After 72 hours, the body is virtually free of nicotine. Bronchial tubes relax and breathing becomes easier.   After 2 to 12 weeks, lungs can hold more air. Exercise becomes easier and circulation improves.   Quitting will reduce your risk of having a heart attack, stroke, cancer, or lung disease:   After 1 year, the risk of coronary heart disease is cut in half.   After 5 years, the risk of stroke falls to the same as a nonsmoker.   After 10 years, the risk of lung cancer is cut in half and the risk of other cancers decreases significantly.   After 15 years, the risk of coronary heart disease drops, usually   to the level of a nonsmoker.   If you are pregnant, quitting smoking will improve your chances of having a healthy baby.   The people you live with, especially your children, will be healthier.   You will have extra money to spend on things other than cigarettes.  FIVE KEYS TO QUITTING Studies have shown that these 5 steps will help you quit smoking and quit for good. You have the best chances of quitting if you use them together: 1. Get ready.  2. Get support and encouragement.  3. Learn new skills and behaviors.  4. Get medicine to reduce your nicotine addiction and use it correctly.  5. Be prepared for relapse or  difficult situations. Be determined to continue trying to quit, even if you do not succeed at first.  1. GET READY  Set a quit date.   Change your environment.   Get rid of ALL cigarettes, ashtrays, matches, and lighters in your home, car, and place of work.   Do not let people smoke in your home.   Review your past attempts to quit. Think about what worked and what did not.   Once you quit, do not smoke. NOT EVEN A PUFF!  2. GET SUPPORT AND ENCOURAGEMENT Studies have shown that you have a better chance of being successful if you have help. You can get support in many ways.  Tell your family, friends, and coworkers that you are going to quit and need their support. Ask them not to smoke around you.   Talk to your caregivers (doctor, dentist, nurse, pharmacist, psychologist, and/or smoking counselor).   Get individual, group, or telephone counseling and support. The more counseling you have, the better your chances are of quitting. Programs are available at local hospitals and health centers. Call your local health department for information about programs in your area.   Spiritual beliefs and practices may help some smokers quit.   Quit meters are small computer programs online or downloadable that keep track of quit statistics, such as amount of "quit-time," cigarettes not smoked, and money saved.   Many smokers find one or more of the many self-help books available useful in helping them quit and stay off tobacco.  3. LEARN NEW SKILLS AND BEHAVIORS  Try to distract yourself from urges to smoke. Talk to someone, go for a walk, or occupy your time with a task.   When you first try to quit, change your routine. Take a different route to work. Drink tea instead of coffee. Eat breakfast in a different place.   Do something to reduce your stress. Take a hot bath, exercise, or read a book.   Plan something enjoyable to do every day. Reward yourself for not smoking.   Explore  interactive web-based programs that specialize in helping you quit.  4. GET MEDICINE AND USE IT CORRECTLY Medicines can help you stop smoking and decrease the urge to smoke. Combining medicine with the above behavioral methods and support can quadruple your chances of successfully quitting smoking. The [place] Food and Drug Administration (FDA) has approved 7 medicines to help you quit smoking. These medicines fall into 3 categories.  Nicotine replacement therapy (delivers nicotine to your body without the negative effects and risks of smoking):   Nicotine gum: Available over-the-counter.   Nicotine lozenges: Available over-the-counter.   Nicotine inhaler: Available by prescription.   Nicotine nasal spray: Available by prescription.   Nicotine skin patches (transdermal): Available by prescription and over-the-counter.   Antidepressant medicine (  helps people abstain from smoking, but how this works is unknown):   Bupropion sustained-release (SR) tablets: Available by prescription.   Nicotinic receptor partial agonist (simulates the effect of nicotine in your brain):   Varenicline tartrate tablets: Available by prescription.   Ask your caregiver for advice about which medicines to use and how to use them. Carefully read the information on the package.   Everyone who is trying to quit may benefit from using a medicine. If you are pregnant or trying to become pregnant, nursing an infant, you are under age 18, or you smoke fewer than 10 cigarettes per day, talk to your caregiver before taking any nicotine replacement medicines.   You should stop using a nicotine replacement product and call your caregiver if you experience nausea, dizziness, weakness, vomiting, fast or irregular heartbeat, mouth problems with the lozenge or gum, or redness or swelling of the skin around the patch that does not go away.   Do not use any other product containing nicotine while using a nicotine replacement  product.   Talk to your caregiver before using these products if you have diabetes, heart disease, asthma, stomach ulcers, you had a recent heart attack, you have high blood pressure that is not controlled with medicine, a history of irregular heartbeat, or you have been prescribed medicine to help you quit smoking.  5. BE PREPARED FOR RELAPSE OR DIFFICULT SITUATIONS  Most relapses occur within the first 3 months after quitting. Do not be discouraged if you start smoking again. Remember, most people try several times before they finally quit.   You may have symptoms of withdrawal because your body is used to nicotine. You may crave cigarettes, be irritable, feel very hungry, cough often, get headaches, or have difficulty concentrating.   The withdrawal symptoms are only temporary. They are strongest when you first quit, but they will go away within 10 to 14 days.  Here are some difficult situations to watch for:  Alcohol. Avoid drinking alcohol. Drinking lowers your chances of successfully quitting.   Caffeine. Try to reduce the amount of caffeine you consume. It also lowers your chances of successfully quitting.   Other smokers. Being around smoking can make you want to smoke. Avoid smokers.   Weight gain. Many smokers will gain weight when they quit, usually less than 10 pounds. Eat a healthy diet and stay active. Do not let weight gain distract you from your main goal, quitting smoking. Some medicines that help you quit smoking may also help delay weight gain. You can always lose the weight gained after you quit.   Bad mood or depression. There are a lot of ways to improve your mood other than smoking.  If you are having problems with any of these situations, talk to your caregiver. SPECIAL SITUATIONS AND CONDITIONS Studies suggest that everyone can quit smoking. Your situation or condition can give you a special reason to quit.  Pregnant women/new mothers: By quitting, you protect your  baby's health and your own.   Hospitalized patients: By quitting, you reduce health problems and help healing.   Heart attack patients: By quitting, you reduce your risk of a second heart attack.   Lung, head, and neck cancer patients: By quitting, you reduce your chance of a second cancer.   Parents of children and adolescents: By quitting, you protect your children from illnesses caused by secondhand smoke.  QUESTIONS TO THINK ABOUT Think about the following questions before you try to stop smoking. You may   want to talk about your answers with your caregiver.  Why do you want to quit?   If you tried to quit in the past, what helped and what did not?   What will be the most difficult situations for you after you quit? How will you plan to handle them?   Who can help you through the tough times? Your family? Friends? Caregiver?   What pleasures do you get from smoking? What ways can you still get pleasure if you quit?  Here are some questions to ask your caregiver:  How can you help me to be successful at quitting?   What medicine do you think would be best for me and how should I take it?   What should I do if I need more help?   What is smoking withdrawal like? How can I get information on withdrawal?  Quitting takes hard work and a lot of effort, but you can quit smoking.  

## 2013-05-30 ENCOUNTER — Encounter: Payer: Self-pay | Admitting: Internal Medicine

## 2013-09-26 ENCOUNTER — Other Ambulatory Visit: Payer: Self-pay

## 2014-05-19 ENCOUNTER — Ambulatory Visit (INDEPENDENT_AMBULATORY_CARE_PROVIDER_SITE_OTHER): Payer: 59 | Admitting: Gynecology

## 2014-05-19 ENCOUNTER — Other Ambulatory Visit (HOSPITAL_COMMUNITY)
Admission: RE | Admit: 2014-05-19 | Discharge: 2014-05-19 | Disposition: A | Discharge status: Home / Self Care | Payer: 59 | Source: Ambulatory Visit | Attending: Gynecology | Admitting: Gynecology

## 2014-05-19 ENCOUNTER — Encounter: Payer: Self-pay | Admitting: Gynecology

## 2014-05-19 VITALS — BP 124/80 | Ht 64.0 in | Wt 173.0 lb

## 2014-05-19 DIAGNOSIS — Z01419 Encounter for gynecological examination (general) (routine) without abnormal findings: Secondary | ICD-10-CM

## 2014-05-19 DIAGNOSIS — Z1151 Encounter for screening for human papillomavirus (HPV): Secondary | ICD-10-CM | POA: Insufficient documentation

## 2014-05-19 LAB — CBC WITH DIFFERENTIAL/PLATELET
BASOS ABS: 0 10*3/uL (ref 0.0–0.1)
BASOS PCT: 0 % (ref 0–1)
EOS ABS: 0.1 10*3/uL (ref 0.0–0.7)
EOS PCT: 1 % (ref 0–5)
HEMATOCRIT: 42.6 % (ref 36.0–46.0)
Hemoglobin: 15.2 g/dL — ABNORMAL HIGH (ref 12.0–15.0)
Lymphocytes Relative: 37 % (ref 12–46)
Lymphs Abs: 1.9 10*3/uL (ref 0.7–4.0)
MCH: 32.9 pg (ref 26.0–34.0)
MCHC: 35.7 g/dL (ref 30.0–36.0)
MCV: 92.2 fL (ref 78.0–100.0)
MONO ABS: 0.5 10*3/uL (ref 0.1–1.0)
Monocytes Relative: 9 % (ref 3–12)
NEUTROS ABS: 2.7 10*3/uL (ref 1.7–7.7)
Neutrophils Relative %: 53 % (ref 43–77)
Platelets: 231 10*3/uL (ref 150–400)
RBC: 4.62 MIL/uL (ref 3.87–5.11)
RDW: 13.1 % (ref 11.5–15.5)
WBC: 5 10*3/uL (ref 4.0–10.5)

## 2014-05-19 NOTE — Progress Notes (Signed)
Brittany Burton 08-15-1956 [id number]        58 y.o.  G0P0 for annual exam.  Doing well without complaints.  Past medical history,surgical history, problem list, medications, allergies, family history and social history were all reviewed and documented as reviewed in the EPIC chart.  ROS:  12 system ROS performed with pertinent positives and negatives included in the history, assessment and plan.   Additional significant findings :  None   Exam: [facility]sellor Vitals:   05/19/14 1120  BP: 124/80  Height: 5\' 4"  (1.626 m)  Weight: 173 lb (78.472 kg)   General appearance:  Normal affect, orientation and appearance. Skin: Grossly normal HEENT: Without gross lesions.  No cervical or supraclavicular adenopathy. Thyroid normal.  Lungs:  Clear without wheezing, rales or rhonchi Cardiac: RR, without RMG Abdominal:  Soft, nontender, without masses, guarding, rebound, organomegaly or hernia Breasts:  Examined lying and sitting without masses, retractions, discharge or axillary adenopathy. Pelvic:  Ext/BUS/vagina with mild atrophic changes.  Cervix normal. Pap/HPV  Uterus anteverted, normal size, shape and contour, midline and mobile nontender   Adnexa  Without masses or tenderness    Anus and perineum  Normal   Rectovaginal  Normal sphincter tone without palpated masses or tenderness.    Assessment/Plan:  58 y.o. G0P0 female for annual exam.   1. Postmenopausal. Without significant hot flashes, night sweats, vaginal dryness, dyspareunia. No vaginal bleeding. Will continue to monitor. Report any vaginal bleeding. 2. Pap smear 2012. Pap/HPV today. No history of abnormal Pap smears previously. Plan repeat Pap smear at 3-5 year interval assuming this Pap smear is normal according to current screening guidelines. 3. Mammography 04/2013. Patient has a scheduled in 2 weeks. Continue with annual mammography. SBE monthly reviewed. 4. DEXA 2011 normal. Repeat DEXA next year at 5 year interval.  Check vitamin D level today. 5. Colonoscopy 2008 with reported repeat interval 10 years. 6. Stop smoking again discussed. 7. Health maintenance. Patient requests baseline labs. CBC comprehensive metabolic panel lipid profile urinalysis TSH vitamin D ordered. Followup one year, sooner as needed.   Note: This document was prepared with digital dictation and possible smart phrase technology. Any transcriptional errors that result from this process are unintentional.   Brittany Auerbach MD, 11:47 AM 05/19/2014

## 2014-05-19 NOTE — Addendum Note (Signed)
Addended by: Nelva Nay on: 05/19/2014 12:01 PM   Modules accepted: Orders

## 2014-05-19 NOTE — Patient Instructions (Signed)
Followup in one year, sooner if any issues. Report any vaginal bleeding.  You may obtain a copy of any labs that were done today by logging onto MyChart as outlined in the instructions provided with your AVS (after visit summary). The office will not call with normal lab results but certainly if there are any significant abnormalities then we will contact you.   Health Maintenance, Female A healthy lifestyle and preventative care can promote health and wellness.  Maintain regular health, dental, and eye exams.  Eat a healthy diet. Foods like vegetables, fruits, whole grains, low-fat dairy products, and lean protein foods contain the nutrients you need without too many calories. Decrease your intake of foods high in solid fats, added sugars, and salt. Get information about a proper diet from your caregiver, if necessary.  Regular physical exercise is one of the most important things you can do for your health. Most adults should get at least 150 minutes of moderate-intensity exercise (any activity that increases your heart rate and causes you to sweat) each week. In addition, most adults need muscle-strengthening exercises on 2 or more days a week.   Maintain a healthy weight. The body mass index (BMI) is a screening tool to identify possible weight problems. It provides an estimate of body fat based on height and weight. Your caregiver can help determine your BMI, and can help you achieve or maintain a healthy weight. For adults 20 years and older:  A BMI below 18.5 is considered underweight.  A BMI of 18.5 to 24.9 is normal.  A BMI of 25 to 29.9 is considered overweight.  A BMI of 30 and above is considered obese.  Maintain normal blood lipids and cholesterol by exercising and minimizing your intake of saturated fat. Eat a balanced diet with plenty of fruits and vegetables. Blood tests for lipids and cholesterol should begin at age 26 and be repeated every 5 years. If your lipid or  cholesterol levels are high, you are over 50, or you are a high risk for heart disease, you may need your cholesterol levels checked more frequently.Ongoing high lipid and cholesterol levels should be treated with medicines if diet and exercise are not effective.  If you smoke, find out from your caregiver how to quit. If you do not use tobacco, do not start.  Lung cancer screening is recommended for adults aged 83 80 years who are at high risk for developing lung cancer because of a history of smoking. Yearly low-dose computed tomography (CT) is recommended for people who have at least a 30-pack-year history of smoking and are a current smoker or have quit within the past 15 years. A pack year of smoking is smoking an average of 1 pack of cigarettes a day for 1 year (for example: 1 pack a day for 30 years or 2 packs a day for 15 years). Yearly screening should continue until the smoker has stopped smoking for at least 15 years. Yearly screening should also be stopped for people who develop a health problem that would prevent them from having lung cancer treatment.  If you are pregnant, do not drink alcohol. If you are breastfeeding, be very cautious about drinking alcohol. If you are not pregnant and choose to drink alcohol, do not exceed 1 drink per day. One drink is considered to be 12 ounces (355 mL) of beer, 5 ounces (148 mL) of wine, or 1.5 ounces (44 mL) of liquor.  Avoid use of street drugs. Do not share needles  with anyone. Ask for help if you need support or instructions about stopping the use of drugs.  High blood pressure causes heart disease and increases the risk of stroke. Blood pressure should be checked at least every 1 to 2 years. Ongoing high blood pressure should be treated with medicines, if weight loss and exercise are not effective.  If you are 55 to 58 years old, ask your caregiver if you should take aspirin to prevent strokes.  Diabetes screening involves taking a blood sample  to check your fasting blood sugar level. This should be done once every 3 years, after age 45, if you are within normal weight and without risk factors for diabetes. Testing should be considered at a younger age or be carried out more frequently if you are overweight and have at least 1 risk factor for diabetes.  Breast cancer screening is essential preventative care for women. You should practice "breast self-awareness." This means understanding the normal appearance and feel of your breasts and may include breast self-examination. Any changes detected, no matter how small, should be reported to a caregiver. Women in their 20s and 30s should have a clinical breast exam (CBE) by a caregiver as part of a regular health exam every 1 to 3 years. After age 40, women should have a CBE every year. Starting at age 40, women should consider having a mammogram (breast X-ray) every year. Women who have a family history of breast cancer should talk to their caregiver about genetic screening. Women at a high risk of breast cancer should talk to their caregiver about having an MRI and a mammogram every year.  Breast cancer gene (BRCA)-related cancer risk assessment is recommended for women who have family members with BRCA-related cancers. BRCA-related cancers include breast, ovarian, tubal, and peritoneal cancers. Having family members with these cancers may be associated with an increased risk for harmful changes (mutations) in the breast cancer genes BRCA1 and BRCA2. Results of the assessment will determine the need for genetic counseling and BRCA1 and BRCA2 testing.  The Pap test is a screening test for cervical cancer. Women should have a Pap test starting at age 21. Between ages 21 and 29, Pap tests should be repeated every 2 years. Beginning at age 30, you should have a Pap test every 3 years as long as the past 3 Pap tests have been normal. If you had a hysterectomy for a problem that was not cancer or a condition  that could lead to cancer, then you no longer need Pap tests. If you are between ages 65 and 70, and you have had normal Pap tests going back 10 years, you no longer need Pap tests. If you have had past treatment for cervical cancer or a condition that could lead to cancer, you need Pap tests and screening for cancer for at least 20 years after your treatment. If Pap tests have been discontinued, risk factors (such as a new sexual partner) need to be reassessed to determine if screening should be resumed. Some women have medical problems that increase the chance of getting cervical cancer. In these cases, your caregiver may recommend more frequent screening and Pap tests.  The human papillomavirus (HPV) test is an additional test that may be used for cervical cancer screening. The HPV test looks for the virus that can cause the cell changes on the cervix. The cells collected during the Pap test can be tested for HPV. The HPV test could be used to screen women aged   30 years and older, and should be used in women of any age who have unclear Pap test results. After the age of 30, women should have HPV testing at the same frequency as a Pap test.  Colorectal cancer can be detected and often prevented. Most routine colorectal cancer screening begins at the age of 50 and continues through age 75. However, your caregiver may recommend screening at an earlier age if you have risk factors for colon cancer. On a yearly basis, your caregiver may provide home test kits to check for hidden blood in the stool. Use of a small camera at the end of a tube, to directly examine the colon (sigmoidoscopy or colonoscopy), can detect the earliest forms of colorectal cancer. Talk to your caregiver about this at age 50, when routine screening begins. Direct examination of the colon should be repeated every 5 to 10 years through age 75, unless early forms of pre-cancerous polyps or small growths are found.  Hepatitis C blood testing is  recommended for all people born from 1945 through 1965 and any individual with known risks for hepatitis C.  Practice safe sex. Use condoms and avoid high-risk sexual practices to reduce the spread of sexually transmitted infections (STIs). Sexually active women aged 25 and younger should be checked for Chlamydia, which is a common sexually transmitted infection. Older women with new or multiple partners should also be tested for Chlamydia. Testing for other STIs is recommended if you are sexually active and at increased risk.  Osteoporosis is a disease in which the bones lose minerals and strength with aging. This can result in serious bone fractures. The risk of osteoporosis can be identified using a bone density scan. Women ages 65 and over and women at risk for fractures or osteoporosis should discuss screening with their caregivers. Ask your caregiver whether you should be taking a calcium supplement or vitamin D to reduce the rate of osteoporosis.  Menopause can be associated with physical symptoms and risks. Hormone replacement therapy is available to decrease symptoms and risks. You should talk to your caregiver about whether hormone replacement therapy is right for you.  Use sunscreen. Apply sunscreen liberally and repeatedly throughout the day. You should seek shade when your shadow is shorter than you. Protect yourself by wearing long sleeves, pants, a wide-brimmed hat, and sunglasses year round, whenever you are outdoors.  Notify your caregiver of new moles or changes in moles, especially if there is a change in shape or color. Also notify your caregiver if a mole is larger than the size of a pencil eraser.  Stay current with your immunizations. Document Released: 05/23/2011 Document Revised: 03/04/2013 Document Reviewed: 05/23/2011 [employer] Patient Information 2014 [employer].   

## 2014-05-20 LAB — COMPREHENSIVE METABOLIC PANEL
ALK PHOS: 90 U/L (ref 39–117)
ALT: 36 U/L — ABNORMAL HIGH (ref 0–35)
AST: 40 U/L — AB (ref 0–37)
Albumin: 4.1 g/dL (ref 3.5–5.2)
BILIRUBIN TOTAL: 0.7 mg/dL (ref 0.2–1.2)
BUN: 14 mg/dL (ref 6–23)
CO2: 25 mEq/L (ref 19–32)
CREATININE: 0.7 mg/dL (ref 0.50–1.10)
Calcium: 9.4 mg/dL (ref 8.4–10.5)
Chloride: 105 mEq/L (ref 96–112)
Glucose, Bld: 103 mg/dL — ABNORMAL HIGH (ref 70–99)
Potassium: 4.6 mEq/L (ref 3.5–5.3)
Sodium: 138 mEq/L (ref 135–145)
Total Protein: 6.6 g/dL (ref 6.0–8.3)

## 2014-05-20 LAB — CYTOLOGY - PAP

## 2014-05-20 LAB — URINALYSIS W MICROSCOPIC + REFLEX CULTURE
BILIRUBIN URINE: NEGATIVE
CASTS: NONE SEEN
CRYSTALS: NONE SEEN
Glucose, UA: NEGATIVE mg/dL
Hgb urine dipstick: NEGATIVE
Ketones, ur: NEGATIVE mg/dL
Leukocytes, UA: NEGATIVE
Nitrite: NEGATIVE
Protein, ur: NEGATIVE mg/dL
SQUAMOUS EPITHELIAL / LPF: NONE SEEN
Specific Gravity, Urine: 1.02 (ref 1.005–1.030)
UROBILINOGEN UA: 0.2 mg/dL (ref 0.0–1.0)
pH: 6 (ref 5.0–8.0)

## 2014-05-20 LAB — LIPID PANEL
CHOL/HDL RATIO: 5.8 ratio
Cholesterol: 269 mg/dL — ABNORMAL HIGH (ref 0–200)
HDL: 46 mg/dL (ref >39)
LDL CALC: 203 mg/dL — AB (ref 0–99)
Triglycerides: 101 mg/dL (ref <150)
VLDL: 20 mg/dL (ref 0–40)

## 2014-05-20 LAB — VITAMIN D 25 HYDROXY (VIT D DEFICIENCY, FRACTURES): Vit D, 25-Hydroxy: 39 ng/mL (ref 30–89)

## 2014-05-20 LAB — TSH: TSH: 3.109 u[IU]/mL (ref 0.350–4.500)

## 2014-05-21 ENCOUNTER — Other Ambulatory Visit: Payer: Self-pay | Admitting: Gynecology

## 2014-05-21 MED ORDER — SULFAMETHOXAZOLE-TMP DS 800-160 MG PO TABS: 1.0000 | ORAL_TABLET | Freq: Two times a day (BID) | ORAL | Status: DC

## 2014-05-22 ENCOUNTER — Other Ambulatory Visit: Payer: Self-pay | Admitting: Gynecology

## 2014-05-22 LAB — URINE CULTURE

## 2014-05-22 MED ORDER — CIPROFLOXACIN HCL 250 MG PO TABS: 250.0000 mg | ORAL_TABLET | Freq: Two times a day (BID) | ORAL | Status: DC

## 2014-05-28 ENCOUNTER — Encounter: Payer: Self-pay | Admitting: Internal Medicine

## 2014-05-28 LAB — HM MAMMOGRAPHY

## 2014-06-12 ENCOUNTER — Ambulatory Visit (INDEPENDENT_AMBULATORY_CARE_PROVIDER_SITE_OTHER): Payer: 59 | Admitting: Internal Medicine

## 2014-06-12 ENCOUNTER — Encounter: Payer: Self-pay | Admitting: Internal Medicine

## 2014-06-12 VITALS — BP 120/78 | HR 78 | Temp 97.7°F | Ht 65.0 in | Wt 172.4 lb

## 2014-06-12 DIAGNOSIS — M25539 Pain in unspecified wrist: Secondary | ICD-10-CM

## 2014-06-12 DIAGNOSIS — E785 Hyperlipidemia, unspecified: Secondary | ICD-10-CM

## 2014-06-12 DIAGNOSIS — Z0000 Encounter for general adult medical examination without abnormal findings: Secondary | ICD-10-CM

## 2014-06-12 DIAGNOSIS — M25532 Pain in left wrist: Secondary | ICD-10-CM

## 2014-06-12 MED ORDER — MELOXICAM 15 MG PO TABS: 15.0000 mg | ORAL_TABLET | Freq: Every day | ORAL | Status: DC

## 2014-06-12 MED ORDER — SIMVASTATIN 20 MG PO TABS: 20.0000 mg | ORAL_TABLET | Freq: Every day | ORAL | Status: DC

## 2014-06-12 MED ORDER — MECLIZINE HCL 25 MG PO TABS: 25.0000 mg | ORAL_TABLET | Freq: Every day | ORAL | Status: DC | PRN

## 2014-06-12 NOTE — Progress Notes (Signed)
Pre visit review using our clinic review tool, if applicable. No additional management support is needed unless otherwise documented below in the visit note. 

## 2014-06-12 NOTE — Progress Notes (Signed)
Subjective:    Patient ID: Brittany Burton, female    DOB: 1956-10-15, 58 y.o.   MRN: [id number]  HPI  Here for wellness and f/u;  Overall doing ok;  Pt denies CP, worsening SOB, DOE, wheezing, orthopnea, PND, worsening LE edema, palpitations, dizziness or syncope.  Pt denies neurological change such as new headache, facial or extremity weakness.  Pt denies polydipsia, polyuria, or low sugar symptoms. Pt states overall good compliance with treatment and medications, good tolerability, and has been trying to follow lower cholesterol diet.  Pt denies worsening depressive symptoms, suicidal ideation or panic. No fever, night sweats, wt loss, loss of appetite, or other constitutional symptoms.  Pt states good ability with ADL's, has low fall risk, home safety reviewed and adequate, no other significant changes in hearing or vision, and only occasionally active with exercise.  Has occas left wrist pain.   Past Medical History  Diagnosis Date  . VITAMIN D DEFICIENCY 06/03/2009  . HYPERLIPIDEMIA 06/03/2009  . GERD 06/03/2009  . POSTURAL LIGHTHEADEDNESS 06/17/2010  . LEG PAIN, RIGHT 03/18/2010  . BACK PAIN 03/18/2010  . Vertigo     chronic recurrent, BPV, probl left related  . PAF (paroxysmal atrial fibrillation) 09/04/2011   No past surgical history on file.  reports that she has been smoking Cigarettes.  She has a 30 pack-year smoking history. She does not have any smokeless tobacco history on file. She reports that she does not drink alcohol or use illicit drugs. family history includes Cancer in her paternal grandmother; Cancer (age of onset: 25) in her sister; Melanoma in her father and sister. Allergies  Allergen Reactions  . Crestor [Rosuvastatin]   . Lipitor [Atorvastatin]    Current Outpatient Prescriptions on File Prior to Visit  Medication Sig Dispense Refill  . aspirin 81 MG EC tablet Take 1 tablet (81 mg total) by mouth daily. Swallow whole.  30 tablet  12  . Calcium Carbonate-Vitamin  D (CALCIUM + D PO) Take by mouth.       No current facility-administered medications on file prior to visit.   Review of Systems Constitutional: Negative for increased diaphoresis, other activity, appetite or other siginficant weight change  HENT: Negative for worsening hearing loss, ear pain, facial swelling, mouth sores and neck stiffness.   Eyes: Negative for other worsening pain, redness or visual disturbance.  Respiratory: Negative for shortness of breath and wheezing.   Cardiovascular: Negative for chest pain and palpitations.  Gastrointestinal: Negative for diarrhea, blood in stool, abdominal distention or other pain Genitourinary: Negative for hematuria, flank pain or change in urine volume.  Musculoskeletal: Negative for myalgias or other joint complaints.  Skin: Negative for color change and wound.  Neurological: Negative for syncope and numbness. other than noted Hematological: Negative for adenopathy. or other swelling Psychiatric/Behavioral: Negative for hallucinations, self-injury, decreased concentration or other worsening agitation.      Objective:   Physical Exam BP 120/78  Pulse 78  Temp(Src) 97.7 F (36.5 C) (Oral)  Ht 5\' 5"  (1.651 m)  Wt 172 lb 6 oz (78.189 kg)  BMI 28.68 kg/m2  SpO2 97% VS noted,  Constitutional: Pt is oriented to person, place, and time. Appears well-developed and well-nourished.  Head: Normocephalic and atraumatic.  Right Ear: External ear normal.  Left Ear: External ear normal.  Nose: Nose normal.  Mouth/Throat: Oropharynx is clear and moist.  Eyes: Conjunctivae and EOM are normal. Pupils are equal, round, and reactive to light.  Neck: Normal range of motion. Neck  supple. No JVD present. No tracheal deviation present.  Cardiovascular: Normal rate, regular rhythm, normal heart sounds and intact distal pulses.   Pulmonary/Chest: Effort normal and breath sounds without rales or wheezing  Abdominal: Soft. Bowel sounds are normal. NT. No HSM   Musculoskeletal: Normal range of motion. Exhibits no edema.  Lymphadenopathy:  Has no cervical adenopathy.  Neurological: Pt is alert and oriented to person, place, and time. Pt has normal reflexes. No cranial nerve deficit. Motor grossly intact Skin: Skin is warm and dry. No rash noted.  Psychiatric:  Has normal mood and affect. Behavior is normal.  Left wrist without tender, swelling, has from    Assessment & Plan:

## 2014-06-12 NOTE — Patient Instructions (Addendum)
Please take all new medication as prescribed - the statin (simvastatin), and the anti-inflammatory as needed for pain  Please consider seeing Dr Smith/sport medicine if the left wrist is not improved in 1-2 wks  Please continue all other medications as before, and refills have been done if requested - the meclizine  Please have the pharmacy call with any other refills you may need.  Please continue your efforts at being more active, low cholesterol diet, and weight control.  You are otherwise up to date with prevention measures today.  Please keep your appointments with your specialists as you may have planned  Please remember to followup with your GYN for the yearly pap smear and/or mammogram  No further lab work needed at this time  Please return in 1 year for your yearly visit, or sooner if needed, with Lab testing done 3-5 days before

## 2014-06-15 DIAGNOSIS — M25532 Pain in left wrist: Secondary | ICD-10-CM | POA: Insufficient documentation

## 2014-06-15 NOTE — Assessment & Plan Note (Signed)
?   djd vs tendonitis, for nsaid prn, for f/u sport med if not improved

## 2014-06-15 NOTE — Assessment & Plan Note (Signed)
Mild uncontrolled, for zocor 20 qd

## 2014-06-15 NOTE — Assessment & Plan Note (Signed)

## 2014-08-19 ENCOUNTER — Encounter: Payer: Self-pay | Admitting: Cardiovascular Disease

## 2014-12-10 ENCOUNTER — Encounter: Payer: Self-pay | Admitting: Gastroenterology

## 2015-05-21 ENCOUNTER — Encounter: Payer: Self-pay | Admitting: Gynecology

## 2015-05-21 ENCOUNTER — Ambulatory Visit (INDEPENDENT_AMBULATORY_CARE_PROVIDER_SITE_OTHER): Payer: 59 | Admitting: Gynecology

## 2015-05-21 VITALS — BP 122/74 | Ht 65.0 in | Wt 173.0 lb

## 2015-05-21 DIAGNOSIS — Z01419 Encounter for gynecological examination (general) (routine) without abnormal findings: Secondary | ICD-10-CM

## 2015-05-21 NOTE — Patient Instructions (Signed)
You may obtain a copy of any labs that were done today by logging onto MyChart as outlined in the instructions provided with your AVS (after visit summary). The office will not call with normal lab results but certainly if there are any significant abnormalities then we will contact you.   Health Maintenance, Female A healthy lifestyle and preventative care can promote health and wellness.  Maintain regular health, dental, and eye exams.  Eat a healthy diet. Foods like vegetables, fruits, whole grains, low-fat dairy products, and lean protein foods contain the nutrients you need without too many calories. Decrease your intake of foods high in solid fats, added sugars, and salt. Get information about a proper diet from your caregiver, if necessary.  Regular physical exercise is one of the most important things you can do for your health. Most adults should get at least 150 minutes of moderate-intensity exercise (any activity that increases your heart rate and causes you to sweat) each week. In addition, most adults need muscle-strengthening exercises on 2 or more days a week.   Maintain a healthy weight. The body mass index (BMI) is a screening tool to identify possible weight problems. It provides an estimate of body fat based on height and weight. Your caregiver can help determine your BMI, and can help you achieve or maintain a healthy weight. For adults 20 years and older:  A BMI below 18.5 is considered underweight.  A BMI of 18.5 to 24.9 is normal.  A BMI of 25 to 29.9 is considered overweight.  A BMI of 30 and above is considered obese.  Maintain normal blood lipids and cholesterol by exercising and minimizing your intake of saturated fat. Eat a balanced diet with plenty of fruits and vegetables. Blood tests for lipids and cholesterol should begin at age 61 and be repeated every 5 years. If your lipid or cholesterol levels are high, you are over 50, or you are a high risk for heart  disease, you may need your cholesterol levels checked more frequently.Ongoing high lipid and cholesterol levels should be treated with medicines if diet and exercise are not effective.  If you smoke, find out from your caregiver how to quit. If you do not use tobacco, do not start.  Lung cancer screening is recommended for adults aged 33 80 years who are at high risk for developing lung cancer because of a history of smoking. Yearly low-dose computed tomography (CT) is recommended for people who have at least a 30-pack-year history of smoking and are a current smoker or have quit within the past 15 years. A pack year of smoking is smoking an average of 1 pack of cigarettes a day for 1 year (for example: 1 pack a day for 30 years or 2 packs a day for 15 years). Yearly screening should continue until the smoker has stopped smoking for at least 15 years. Yearly screening should also be stopped for people who develop a health problem that would prevent them from having lung cancer treatment.  If you are pregnant, do not drink alcohol. If you are breastfeeding, be very cautious about drinking alcohol. If you are not pregnant and choose to drink alcohol, do not exceed 1 drink per day. One drink is considered to be 12 ounces (355 mL) of beer, 5 ounces (148 mL) of wine, or 1.5 ounces (44 mL) of liquor.  Avoid use of street drugs. Do not share needles with anyone. Ask for help if you need support or instructions about stopping  the use of drugs.  High blood pressure causes heart disease and increases the risk of stroke. Blood pressure should be checked at least every 1 to 2 years. Ongoing high blood pressure should be treated with medicines, if weight loss and exercise are not effective.  If you are 59 to 59 years old, ask your caregiver if you should take aspirin to prevent strokes.  Diabetes screening involves taking a blood sample to check your fasting blood sugar level. This should be done once every 3  years, after age 91, if you are within normal weight and without risk factors for diabetes. Testing should be considered at a younger age or be carried out more frequently if you are overweight and have at least 1 risk factor for diabetes.  Breast cancer screening is essential preventative care for women. You should practice "breast self-awareness." This means understanding the normal appearance and feel of your breasts and may include breast self-examination. Any changes detected, no matter how small, should be reported to a caregiver. Women in their 66s and 30s should have a clinical breast exam (CBE) by a caregiver as part of a regular health exam every 1 to 3 years. After age 101, women should have a CBE every year. Starting at age 100, women should consider having a mammogram (breast X-ray) every year. Women who have a family history of breast cancer should talk to their caregiver about genetic screening. Women at a high risk of breast cancer should talk to their caregiver about having an MRI and a mammogram every year.  Breast cancer gene (BRCA)-related cancer risk assessment is recommended for women who have family members with BRCA-related cancers. BRCA-related cancers include breast, ovarian, tubal, and peritoneal cancers. Having family members with these cancers may be associated with an increased risk for harmful changes (mutations) in the breast cancer genes BRCA1 and BRCA2. Results of the assessment will determine the need for genetic counseling and BRCA1 and BRCA2 testing.  The Pap test is a screening test for cervical cancer. Women should have a Pap test starting at age 57. Between ages 25 and 35, Pap tests should be repeated every 2 years. Beginning at age 37, you should have a Pap test every 3 years as long as the past 3 Pap tests have been normal. If you had a hysterectomy for a problem that was not cancer or a condition that could lead to cancer, then you no longer need Pap tests. If you are  between ages 50 and 76, and you have had normal Pap tests going back 10 years, you no longer need Pap tests. If you have had past treatment for cervical cancer or a condition that could lead to cancer, you need Pap tests and screening for cancer for at least 20 years after your treatment. If Pap tests have been discontinued, risk factors (such as a new sexual partner) need to be reassessed to determine if screening should be resumed. Some women have medical problems that increase the chance of getting cervical cancer. In these cases, your caregiver may recommend more frequent screening and Pap tests.  The human papillomavirus (HPV) test is an additional test that may be used for cervical cancer screening. The HPV test looks for the virus that can cause the cell changes on the cervix. The cells collected during the Pap test can be tested for HPV. The HPV test could be used to screen women aged 44 years and older, and should be used in women of any age  who have unclear Pap test results. After the age of 55, women should have HPV testing at the same frequency as a Pap test.  Colorectal cancer can be detected and often prevented. Most routine colorectal cancer screening begins at the age of 44 and continues through age 20. However, your caregiver may recommend screening at an earlier age if you have risk factors for colon cancer. On a yearly basis, your caregiver may provide home test kits to check for hidden blood in the stool. Use of a small camera at the end of a tube, to directly examine the colon (sigmoidoscopy or colonoscopy), can detect the earliest forms of colorectal cancer. Talk to your caregiver about this at age 86, when routine screening begins. Direct examination of the colon should be repeated every 5 to 10 years through age 13, unless early forms of pre-cancerous polyps or small growths are found.  Hepatitis C blood testing is recommended for all people born from [birth year] through 1965 and any  individual with known risks for hepatitis C.  Practice safe sex. Use condoms and avoid high-risk sexual practices to reduce the spread of sexually transmitted infections (STIs). Sexually active women aged 36 and younger should be checked for Chlamydia, which is a common sexually transmitted infection. Older women with new or multiple partners should also be tested for Chlamydia. Testing for other STIs is recommended if you are sexually active and at increased risk.  Osteoporosis is a disease in which the bones lose minerals and strength with aging. This can result in serious bone fractures. The risk of osteoporosis can be identified using a bone density scan. Women ages 20 and over and women at risk for fractures or osteoporosis should discuss screening with their caregivers. Ask your caregiver whether you should be taking a calcium supplement or vitamin D to reduce the rate of osteoporosis.  Menopause can be associated with physical symptoms and risks. Hormone replacement therapy is available to decrease symptoms and risks. You should talk to your caregiver about whether hormone replacement therapy is right for you.  Use sunscreen. Apply sunscreen liberally and repeatedly throughout the day. You should seek shade when your shadow is shorter than you. Protect yourself by wearing long sleeves, pants, a wide-brimmed hat, and sunglasses year round, whenever you are outdoors.  Notify your caregiver of new moles or changes in moles, especially if there is a change in shape or color. Also notify your caregiver if a mole is larger than the size of a pencil eraser.  Stay current with your immunizations. Document Released: 05/23/2011 Document Revised: 03/04/2013 Document Reviewed: 05/23/2011 [facility] Patient Information 2014 [employer].

## 2015-05-21 NOTE — Progress Notes (Signed)
Brittany Burton [birth year] [id number]        58 y.o.  G0P0 for annual exam.  Doing well without complaints.  Past medical history,surgical history, problem list, medications, allergies, family history and social history were all reviewed and documented as reviewed in the EPIC chart.  ROS:  Performed with pertinent positives and negatives included in the history, assessment and plan.   Additional significant findings :  none   Exam: Kim Counsellor Vitals:   05/21/15 1150  BP: 122/74  Height: 5\' 5"  (1.651 m)  Weight: 173 lb (78.472 kg)   General appearance:  Normal affect, orientation and appearance. Skin: Grossly normal HEENT: Without gross lesions.  No cervical or supraclavicular adenopathy. Thyroid normal.  Lungs:  Clear without wheezing, rales or rhonchi Cardiac: RR, without RMG Abdominal:  Soft, nontender, without masses, guarding, rebound, organomegaly or hernia Breasts:  Examined lying and sitting without masses, retractions, discharge or axillary adenopathy. Pelvic:  Ext/BUS/vagina with atrophic changes  Cervix atrophic  Uterus anteverted, normal size, shape and contour, midline and mobile nontender   Adnexa  Without masses or tenderness    Anus and perineum  Normal   Rectovaginal  Normal sphincter tone without palpated masses or tenderness.    Assessment/Plan:  59 y.o. G0P0 female for annual exam.   1. Post menopausal/atrophic genital changes. Patient doing well without significant hot flashes, night sweats, vaginal dryness or any vaginal bleeding. Continue to monitor and report any issues or any vaginal bleeding. 2. Mammography coming due in July and I reminded her to schedule this. SBE monthly reviewed. 3. Pap smear/HPV negative 2015. No Pap smear done today. No history of significant abnormal Pap smears previously. 4. Colonoscopy 2008 with reported repeat interval 10 years. 5. DEXA 2011 normal. Plan repeat at age 35. Increased calcium vitamin D  reviewed. 6. Stop smoking strategies again reviewed and encouraged. 7. Health maintenance. No routine blood work done as patient reports this done at her primary physician's office. Follow up 1 year, sooner as needed.   Anastasio Auerbach MD, 12:25 PM 05/21/2015

## 2015-05-22 LAB — URINALYSIS W MICROSCOPIC + REFLEX CULTURE
Bilirubin Urine: NEGATIVE
CRYSTALS: NONE SEEN
Casts: NONE SEEN
GLUCOSE, UA: NEGATIVE mg/dL
Hgb urine dipstick: NEGATIVE
Ketones, ur: NEGATIVE mg/dL
Leukocytes, UA: NEGATIVE
Nitrite: NEGATIVE
PH: 6.5 (ref 5.0–8.0)
Protein, ur: NEGATIVE mg/dL
Specific Gravity, Urine: 1.018 (ref 1.005–1.030)
Urobilinogen, UA: 0.2 mg/dL (ref 0.0–1.0)

## 2015-05-25 LAB — URINE CULTURE

## 2015-05-26 ENCOUNTER — Other Ambulatory Visit: Payer: Self-pay | Admitting: Gynecology

## 2015-05-26 MED ORDER — SULFAMETHOXAZOLE-TRIMETHOPRIM 800-160 MG PO TABS: 1.0000 | ORAL_TABLET | Freq: Two times a day (BID) | ORAL | Status: DC

## 2015-06-01 LAB — HM MAMMOGRAPHY: HM MAMMO: NEGATIVE

## 2015-06-02 ENCOUNTER — Encounter: Payer: Self-pay | Admitting: Internal Medicine

## 2015-06-02 ENCOUNTER — Encounter: Payer: Self-pay | Admitting: Gynecology

## 2015-06-18 ENCOUNTER — Other Ambulatory Visit (INDEPENDENT_AMBULATORY_CARE_PROVIDER_SITE_OTHER): Payer: Self-pay

## 2015-06-18 DIAGNOSIS — Z0000 Encounter for general adult medical examination without abnormal findings: Secondary | ICD-10-CM

## 2015-06-18 LAB — CBC WITH DIFFERENTIAL/PLATELET
BASOS PCT: 0.4 % (ref 0.0–3.0)
Basophils Absolute: 0 10*3/uL (ref 0.0–0.1)
EOS ABS: 0.1 10*3/uL (ref 0.0–0.7)
Eosinophils Relative: 1.7 % (ref 0.0–5.0)
HCT: 44.4 % (ref 36.0–46.0)
Hemoglobin: 15.3 g/dL — ABNORMAL HIGH (ref 12.0–15.0)
Lymphocytes Relative: 29.8 % (ref 12.0–46.0)
Lymphs Abs: 2.1 10*3/uL (ref 0.7–4.0)
MCHC: 34.5 g/dL (ref 30.0–36.0)
MCV: 94.9 fl (ref 78.0–100.0)
Monocytes Absolute: 0.7 10*3/uL (ref 0.1–1.0)
Monocytes Relative: 9.9 % (ref 3.0–12.0)
NEUTROS PCT: 58.2 % (ref 43.0–77.0)
Neutro Abs: 4 10*3/uL (ref 1.4–7.7)
Platelets: 233 10*3/uL (ref 150.0–400.0)
RBC: 4.68 Mil/uL (ref 3.87–5.11)
RDW: 12.8 % (ref 11.5–15.5)
WBC: 6.9 10*3/uL (ref 4.0–10.5)

## 2015-06-18 LAB — TSH: TSH: 4.58 u[IU]/mL — AB (ref 0.35–4.50)

## 2015-06-18 LAB — URINALYSIS, ROUTINE W REFLEX MICROSCOPIC
BILIRUBIN URINE: NEGATIVE
Hgb urine dipstick: NEGATIVE
KETONES UR: NEGATIVE
Leukocytes, UA: NEGATIVE
Nitrite: NEGATIVE
Specific Gravity, Urine: 1.02 (ref 1.000–1.030)
TOTAL PROTEIN, URINE-UPE24: NEGATIVE
URINE GLUCOSE: NEGATIVE
Urobilinogen, UA: 0.2 (ref 0.0–1.0)
pH: 7 (ref 5.0–8.0)

## 2015-06-18 LAB — LIPID PANEL
CHOLESTEROL: 245 mg/dL — AB (ref 0–200)
HDL: 45.4 mg/dL (ref >39.00)
LDL CALC: 173 mg/dL — AB (ref 0–99)
NONHDL: 199.15
TRIGLYCERIDES: 129 mg/dL (ref 0.0–149.0)
Total CHOL/HDL Ratio: 5
VLDL: 25.8 mg/dL (ref 0.0–40.0)

## 2015-06-18 LAB — BASIC METABOLIC PANEL
BUN: 18 mg/dL (ref 6–23)
CHLORIDE: 107 meq/L (ref 96–112)
CO2: 28 mEq/L (ref 19–32)
CREATININE: 0.85 mg/dL (ref 0.40–1.20)
Calcium: 10.1 mg/dL (ref 8.4–10.5)
GFR: 72.76 mL/min (ref >60.00)
GLUCOSE: 113 mg/dL — AB (ref 70–99)
Potassium: 5 mEq/L (ref 3.5–5.1)
Sodium: 143 mEq/L (ref 135–145)

## 2015-06-18 LAB — HEPATIC FUNCTION PANEL
ALT: 42 U/L — AB (ref 0–35)
AST: 55 U/L — AB (ref 0–37)
Albumin: 3.9 g/dL (ref 3.5–5.2)
Alkaline Phosphatase: 121 U/L — ABNORMAL HIGH (ref 39–117)
BILIRUBIN TOTAL: 0.5 mg/dL (ref 0.2–1.2)
Bilirubin, Direct: 0.1 mg/dL (ref 0.0–0.3)
TOTAL PROTEIN: 6.9 g/dL (ref 6.0–8.3)

## 2015-06-24 ENCOUNTER — Ambulatory Visit (INDEPENDENT_AMBULATORY_CARE_PROVIDER_SITE_OTHER): Payer: 59 | Admitting: Internal Medicine

## 2015-06-24 ENCOUNTER — Encounter: Payer: Self-pay | Admitting: Internal Medicine

## 2015-06-24 VITALS — BP 118/72 | HR 79 | Temp 97.6°F | Ht 65.0 in | Wt 173.0 lb

## 2015-06-24 DIAGNOSIS — Z0000 Encounter for general adult medical examination without abnormal findings: Secondary | ICD-10-CM

## 2015-06-24 DIAGNOSIS — E785 Hyperlipidemia, unspecified: Secondary | ICD-10-CM

## 2015-06-24 MED ORDER — ROSUVASTATIN CALCIUM 20 MG PO TABS: 20.0000 mg | ORAL_TABLET | Freq: Every day | ORAL | Status: DC

## 2015-06-24 MED ORDER — SIMVASTATIN 20 MG PO TABS: 20.0000 mg | ORAL_TABLET | Freq: Every day | ORAL | Status: DC

## 2015-06-24 NOTE — Assessment & Plan Note (Signed)
Improved but still uncontrolled,  Lab Results  Component Value Date   LDLCALC 173* 06/18/2015   For change simastatin to crestor 20 mg, to try taking qod

## 2015-06-24 NOTE — Progress Notes (Signed)
Pre visit review using our clinic review tool, if applicable. No additional management support is needed unless otherwise documented below in the visit note. 

## 2015-06-24 NOTE — Patient Instructions (Signed)
Ok to stop the simvastatin  Please take all new medication as prescribed - the crestor 20 mg at every other day  Please continue all other medications as before, and refills have been done if requested.  Please have the pharmacy call with any other refills you may need.  Please continue your efforts at being more active, low cholesterol diet, and weight control.  You are otherwise up to date with prevention measures today.  Please keep your appointments with your specialists as you may have planned  Please return in 1 year for your yearly visit, or sooner if needed, with Lab testing done 3-5 days before

## 2015-06-24 NOTE — Progress Notes (Signed)
Subjective:    Patient ID: Brittany Burton, female    DOB: [birth year], 59 y.o.   MRN: [id number]  HPI  Here for wellness and f/u;  Overall doing ok;  Pt denies Chest pain, worsening SOB, DOE, wheezing, orthopnea, PND, worsening LE edema, palpitations, dizziness or syncope.  Pt denies neurological change such as new headache, facial or extremity weakness.  Pt denies polydipsia, polyuria, or low sugar symptoms. Pt states overall good compliance with treatment and medications, good tolerability, and has been trying to follow appropriate diet.  Pt denies worsening depressive symptoms, suicidal ideation or panic. No fever, night sweats, wt loss, loss of appetite, or other constitutional symptoms.  Pt states good ability with ADL's, has low fall risk, home safety reviewed and adequate, no other significant changes in hearing or vision, and only occasionally active with exercise.  Plays golf occasional. Wt stable.  Has been trying to watch lower chol diet,  Only able to take 1/2 the simavastin 20 mg due to myalgias. Wt Readings from Last 3 Encounters:  06/24/15 173 lb (78.472 kg)  05/21/15 173 lb (78.472 kg)  06/12/14 172 lb 6 oz (78.189 kg)   Past Medical History  Diagnosis Date  . VITAMIN D DEFICIENCY 06/03/2009  . HYPERLIPIDEMIA 06/03/2009  . GERD 06/03/2009  . POSTURAL LIGHTHEADEDNESS 06/17/2010  . LEG PAIN, RIGHT 03/18/2010  . BACK PAIN 03/18/2010  . Vertigo     chronic recurrent, BPV, probl left related  . PAF (paroxysmal atrial fibrillation) 09/04/2011   No past surgical history on file.  reports that she has been smoking Cigarettes.  She has a 30 pack-year smoking history. She does not have any smokeless tobacco history on file. She reports that she drinks alcohol. She reports that she does not use illicit drugs. family history includes Cancer in her paternal grandmother; Melanoma in her father and sister. Allergies  Allergen Reactions  . Lipitor [Atorvastatin]    Current Outpatient  Prescriptions on File Prior to Visit  Medication Sig Dispense Refill  . aspirin 81 MG EC tablet Take 1 tablet (81 mg total) by mouth daily. Swallow whole. 30 tablet 12  . Calcium Carbonate-Vitamin D (CALCIUM + D PO) Take by mouth.    . meclizine (ANTIVERT) 25 MG tablet Take 1 tablet (25 mg total) by mouth daily as needed. 60 tablet 5  . sulfamethoxazole-trimethoprim (BACTRIM DS,SEPTRA DS) 800-160 MG per tablet Take 1 tablet by mouth 2 (two) times daily. (Patient not taking: Reported on 06/24/2015) 6 tablet 0   No current facility-administered medications on file prior to visit.     Review of Systems Constitutional: Negative for increased diaphoresis, other activity, appetite or siginficant weight change other than noted HENT: Negative for worsening hearing loss, ear pain, facial swelling, mouth sores and neck stiffness.   Eyes: Negative for other worsening pain, redness or visual disturbance.  Respiratory: Negative for shortness of breath and wheezing  Cardiovascular: Negative for chest pain and palpitations.  Gastrointestinal: Negative for diarrhea, blood in stool, abdominal distention or other pain Genitourinary: Negative for hematuria, flank pain or change in urine volume.  Musculoskeletal: Negative for myalgias or other joint complaints.  Skin: Negative for color change and wound or drainage.  Neurological: Negative for syncope and numbness. other than noted Hematological: Negative for adenopathy. or other swelling Psychiatric/Behavioral: Negative for hallucinations, SI, self-injury, decreased concentration or other worsening agitation.      Objective:   Physical Exam BP 118/72 mmHg  Pulse 79  Temp(Src) 97.6 F (36.4  C) (Oral)  Ht 5\' 5"  (1.651 m)  Wt 173 lb (78.472 kg)  BMI 28.79 kg/m2  SpO2 97% VS noted,  Constitutional: Pt is oriented to person, place, and time. Appears well-developed and well-nourished, in no significant distress Head: Normocephalic and atraumatic.  Right  Ear: External ear normal.  Left Ear: External ear normal.  Nose: Nose normal.  Mouth/Throat: Oropharynx is clear and moist.  Eyes: Conjunctivae and EOM are normal. Pupils are equal, round, and reactive to light.  Neck: Normal range of motion. Neck supple. No JVD present. No tracheal deviation present or significant neck LA or mass Cardiovascular: Normal rate, regular rhythm, normal heart sounds and intact distal pulses.   Pulmonary/Chest: Effort normal and breath sounds without rales or wheezing  Abdominal: Soft. Bowel sounds are normal. NT. No HSM  Musculoskeletal: Normal range of motion. Exhibits no edema.  Lymphadenopathy:  Has no cervical adenopathy.  Neurological: Pt is alert and oriented to person, place, and time. Pt has normal reflexes. No cranial nerve deficit. Motor grossly intact Skin: Skin is warm and dry. No rash noted.  Psychiatric:  Has normal mood and affect. Behavior is normal.     Assessment & Plan:

## 2015-06-24 NOTE — Assessment & Plan Note (Signed)

## 2015-08-31 ENCOUNTER — Ambulatory Visit (INDEPENDENT_AMBULATORY_CARE_PROVIDER_SITE_OTHER): Payer: 59 | Admitting: Family Medicine

## 2015-08-31 ENCOUNTER — Ambulatory Visit (INDEPENDENT_AMBULATORY_CARE_PROVIDER_SITE_OTHER): Payer: 59

## 2015-08-31 VITALS — BP 120/74 | HR 66 | Temp 97.8°F | Resp 18 | Ht 64.5 in | Wt 171.0 lb

## 2015-08-31 DIAGNOSIS — R0989 Other specified symptoms and signs involving the circulatory and respiratory systems: Secondary | ICD-10-CM

## 2015-08-31 DIAGNOSIS — J189 Pneumonia, unspecified organism: Secondary | ICD-10-CM | POA: Diagnosis not present

## 2015-08-31 MED ORDER — LEVOFLOXACIN 500 MG PO TABS: 500.0000 mg | ORAL_TABLET | Freq: Every day | ORAL | Status: DC

## 2015-08-31 MED ORDER — ALBUTEROL SULFATE HFA 108 (90 BASE) MCG/ACT IN AERS: 2.0000 | INHALATION_SPRAY | Freq: Four times a day (QID) | RESPIRATORY_TRACT | Status: DC | PRN

## 2015-08-31 NOTE — Patient Instructions (Addendum)
It does appear that you have pneumonia Take the levaquin (antibiotic) as directed for 10 days Also use the albuterol as needed for wheezing and cough Please let me know if you are not feeling better in the next few days- Sooner if worse.   Recently, a new recommendation has been made regarding screening for lung cancer using annual "low dose" CT scanning.  This service is recommended for people who are 79- 59 years old, who currently smoke or quit in the last 15 years, and who smoked at least a pack per day for 30 years or more.    Some patients who are at least 59 years old and who smoked a pack per day for 20 years, or were exposed to second hand smoke may also qualify for screening.    In [place] this service is available at [facility], [phone]. The exam costs about $300 but may be covered by insurance.

## 2015-08-31 NOTE — Progress Notes (Signed)
[facility] [address] [place] [address] [phone]  Date:  08/31/2015   Name:  Brittany Burton   DOB:  [birth year]   MRN:  [id number]  PCP:  Cathlean Cower, MD    Chief Complaint: Cough; chest congestion; and Wheezing   History of Present Illness:  Brittany Burton is a 59 y.o. very pleasant female patient who presents with the following:  She has noted a "cold" for about 3 weeks but it seems to be getting worse She has chest congestion She has used mucinex, tylenol She has sinus congestion as well No fever, but she does body aches, some chills She did have a ST/ earache but these are better now No GI symptoms   She notes that she cannot lie flat to sleep She does not have any heart issues per her recollection No ankle edema  Negative myoview 4 years ago  She is a smoker, works at the former [employer] She has noted some wheezing since she got sick- she has used albuterol in the past for illness such as this   Patient Active Problem List   Diagnosis Date Noted  . Left wrist pain 06/15/2014  . Smoker 01/31/2013  . Abnormal liver function tests 01/31/2013  . Meralgia paraesthetica, right 02/29/2012  . PAF (paroxysmal atrial fibrillation) ([place]) 09/04/2011  . Vertigo 09/04/2011  . Preventative health care 08/28/2011  . BACK PAIN 03/18/2010  . VITAMIN D DEFICIENCY 06/03/2009  . Hyperlipidemia 06/03/2009  . GERD 06/03/2009    Past Medical History  Diagnosis Date  . VITAMIN D DEFICIENCY 06/03/2009  . HYPERLIPIDEMIA 06/03/2009  . GERD 06/03/2009  . POSTURAL LIGHTHEADEDNESS 06/17/2010  . LEG PAIN, RIGHT 03/18/2010  . BACK PAIN 03/18/2010  . Vertigo     chronic recurrent, BPV, probl left related  . PAF (paroxysmal atrial fibrillation) ([place]) 09/04/2011    History reviewed. No pertinent past surgical history.  Social History  Substance Use Topics  . Smoking status: Current Every Day Smoker -- 1.00 packs/day for 30 years   Types: Cigarettes  . Smokeless tobacco: None  . Alcohol Use: No     Comment: Rare    Family History  Problem Relation Age of Onset  . Melanoma Father   . Melanoma Sister   . Cancer Paternal Grandmother     colon late 70's    No Known Allergies  Medication list has been reviewed and updated.  Current Outpatient Prescriptions on File Prior to Visit  Medication Sig Dispense Refill  . aspirin 81 MG EC tablet Take 1 tablet (81 mg total) by mouth daily. Swallow whole. 30 tablet 12  . Calcium Carbonate-Vitamin D (CALCIUM + D PO) Take by mouth.    . meclizine (ANTIVERT) 25 MG tablet Take 1 tablet (25 mg total) by mouth daily as needed. 60 tablet 5  . rosuvastatin (CRESTOR) 20 MG tablet Take 1 tablet (20 mg total) by mouth daily. 90 tablet 3  . sulfamethoxazole-trimethoprim (BACTRIM DS,SEPTRA DS) 800-160 MG per tablet Take 1 tablet by mouth 2 (two) times daily. (Patient not taking: Reported on 06/24/2015) 6 tablet 0   No current facility-administered medications on file prior to visit.    Review of Systems:  As per HPI- otherwise negative.   Physical Examination: Filed Vitals:   08/31/15 1142  BP: 120/74  Pulse: 66  Temp: 97.8 F (36.6 C)  Resp: 18   Filed Vitals:   08/31/15 1142  Height: 5' 4.5" (1.638 m)  Weight: 171 lb (77.565 kg)   Body mass index is 28.91 kg/(m^2). Ideal Body Weight: Weight in (lb) to have BMI = 25: 147.6  GEN: WDWN, NAD, Non-toxic, A & O x 3, looks well, mild overweight HEENT: Atraumatic, Normocephalic. Neck supple. No masses, No LAD.  Bilateral TM wnl, oropharynx normal.  PEERL,EOMI.   Ears and Nose: No external deformity. CV: RRR, No M/G/R. No JVD. No thrill. No extra heart sounds. PULM: minimal wheezes, ronchi in the left lower lobs. No retractions. No resp. distress. No accessory muscle use. ABD: S, NT, ND EXTR: No c/c/e NEURO Normal gait.  PSYCH: Normally interactive. Conversant. Not depressed or anxious appearing.  Calm demeanor.   [facility]  reading (PRIMARY) by  Dr. Lorelei Pont. CXR:  Left lower lobe pneumonia   CHEST 2 VIEW  COMPARISON: Portable chest x-ray of August 13, 2011  FINDINGS: There is increased density at the left lung base. There is no pleural effusion or pneumothorax. The right lung is well-expanded and clear. The heart and pulmonary vascularity are normal. The mediastinum is normal in width.  IMPRESSION: New subsegmental atelectasis or early pneumonia in the left lower lobe there is no CHF.  Followup PA and lateral chest X-ray is recommended in 3-4 weeks following trial of antibiotic therapy to ensure resolution and exclude underlying malignancy.   Assessment and Plan: CAP (community acquired pneumonia) - Plan: levofloxacin (LEVAQUIN) 500 MG tablet  Chest congestion - Plan: DG Chest 2 View, albuterol (PROVENTIL HFA;VENTOLIN HFA) 108 (90 BASE) MCG/ACT inhaler  Treat for pneumonia with levaquin, albuterol for wheezing Asked her to follow-up if not better soon Encouraged her to look into lung cancer screening CT scan  Signed Lamar Blinks, MD

## 2015-09-07 ENCOUNTER — Encounter: Payer: Self-pay | Admitting: Family Medicine

## 2015-09-23 ENCOUNTER — Ambulatory Visit (INDEPENDENT_AMBULATORY_CARE_PROVIDER_SITE_OTHER): Payer: 59 | Admitting: Family Medicine

## 2015-09-23 ENCOUNTER — Encounter: Payer: Self-pay | Admitting: Family Medicine

## 2015-09-23 ENCOUNTER — Ambulatory Visit (INDEPENDENT_AMBULATORY_CARE_PROVIDER_SITE_OTHER): Payer: 59

## 2015-09-23 VITALS — BP 124/84 | HR 73 | Temp 97.7°F | Resp 16 | Ht 64.5 in | Wt 170.8 lb

## 2015-09-23 DIAGNOSIS — Z8701 Personal history of pneumonia (recurrent): Secondary | ICD-10-CM | POA: Diagnosis not present

## 2015-09-23 NOTE — Progress Notes (Signed)
[facility] [address] [place] [address] [phone]  Date:  09/23/2015   Name:  Brittany Burton   DOB:  Apr 15, 1956   MRN:  [id number]  PCP:  Cathlean Cower, MD    Chief Complaint: No chief complaint on file.   History of Present Illness:  Brittany Burton is a 59 y.o. very pleasant female patient who presents with the following:  Here today to recheck from her pneumonia visit about 3 weeks ago. She was treated with levaquin 500 for one week.    She is a long term smoker She is feeling better at this point, but it did take a while for her sx to go away Her energy level is back to normal Cough is resolved  Overall she feels fine Patient Active Problem List   Diagnosis Date Noted  . Left wrist pain 06/15/2014  . Smoker 01/31/2013  . Abnormal liver function tests 01/31/2013  . Meralgia paraesthetica, right 02/29/2012  . PAF (paroxysmal atrial fibrillation) ([place]) 09/04/2011  . Vertigo 09/04/2011  . Preventative health care 08/28/2011  . BACK PAIN 03/18/2010  . VITAMIN D DEFICIENCY 06/03/2009  . Hyperlipidemia 06/03/2009  . GERD 06/03/2009    Past Medical History  Diagnosis Date  . VITAMIN D DEFICIENCY 06/03/2009  . HYPERLIPIDEMIA 06/03/2009  . GERD 06/03/2009  . POSTURAL LIGHTHEADEDNESS 06/17/2010  . LEG PAIN, RIGHT 03/18/2010  . BACK PAIN 03/18/2010  . Vertigo     chronic recurrent, BPV, probl left related  . PAF (paroxysmal atrial fibrillation) ([place]) 09/04/2011    No past surgical history on file.  Social History  Substance Use Topics  . Smoking status: Current Every Day Smoker -- 1.00 packs/day for 30 years    Types: Cigarettes  . Smokeless tobacco: Not on file  . Alcohol Use: No     Comment: Rare    Family History  Problem Relation Age of Onset  . Melanoma Father   . Melanoma Sister   . Cancer Paternal Grandmother     colon late 70's    No Known Allergies  Medication list has been reviewed and updated.  Current  Outpatient Prescriptions on File Prior to Visit  Medication Sig Dispense Refill  . albuterol (PROVENTIL HFA;VENTOLIN HFA) 108 (90 BASE) MCG/ACT inhaler Inhale 2 puffs into the lungs every 6 (six) hours as needed for wheezing or shortness of breath. 1 Inhaler 0  . aspirin 81 MG EC tablet Take 1 tablet (81 mg total) by mouth daily. Swallow whole. 30 tablet 12  . Calcium Carbonate-Vitamin D (CALCIUM + D PO) Take by mouth.    . levofloxacin (LEVAQUIN) 500 MG tablet Take 1 tablet (500 mg total) by mouth daily. 7 tablet 0  . meclizine (ANTIVERT) 25 MG tablet Take 1 tablet (25 mg total) by mouth daily as needed. 60 tablet 5  . rosuvastatin (CRESTOR) 20 MG tablet Take 1 tablet (20 mg total) by mouth daily. 90 tablet 3   No current facility-administered medications on file prior to visit.    Review of Systems:  As per HPI- otherwise negative.   Physical Examination: Filed Vitals:   09/23/15 1632  BP: 124/84  Pulse: 73  Temp: 97.7 F (36.5 C)  Resp: 16     GEN: WDWN, NAD, Non-toxic, A & O x 3, looks well HEENT: Atraumatic, Normocephalic. Neck supple. No masses, No LAD. Ears and Nose: No external deformity. CV: RRR, No M/G/R. No JVD. No thrill. No extra heart sounds. PULM:  CTA B, no wheezes, crackles, rhonchi. No retractions. No resp. distress. No accessory muscle use. EXTR: No c/c/e NEURO Normal gait.  PSYCH: Normally interactive. Conversant. Not depressed or anxious appearing.  Calm demeanor.   [facility] reading (PRIMARY) by  Dr. Lorelei Pont. CXR: clear- infiltrate has cleared up  Assessment and Plan: History of pneumonia - Plan: DG Chest 2 View  Follow-up from recent pnuemonia.  She feels well and CXR looks good to me- await final OR and will send her the report.  Assuming all is cleared up we do not need to follow any further   Signed Lamar Blinks, MD

## 2015-09-24 ENCOUNTER — Encounter: Payer: Self-pay | Admitting: Family Medicine

## 2015-09-24 DIAGNOSIS — F17210 Nicotine dependence, cigarettes, uncomplicated: Secondary | ICD-10-CM

## 2015-09-30 ENCOUNTER — Ambulatory Visit: Payer: 59 | Admitting: Family Medicine

## 2015-10-13 ENCOUNTER — Ambulatory Visit
Admission: RE | Admit: 2015-10-13 | Discharge: 2015-10-13 | Disposition: A | Discharge status: Home / Self Care | Payer: 59 | Source: Ambulatory Visit | Attending: Family Medicine | Admitting: Family Medicine

## 2015-10-13 DIAGNOSIS — F17210 Nicotine dependence, cigarettes, uncomplicated: Secondary | ICD-10-CM

## 2015-10-21 ENCOUNTER — Encounter: Payer: Self-pay | Admitting: Family Medicine

## 2015-10-21 ENCOUNTER — Other Ambulatory Visit: Payer: Self-pay | Admitting: Family Medicine

## 2015-10-21 DIAGNOSIS — R0989 Other specified symptoms and signs involving the circulatory and respiratory systems: Secondary | ICD-10-CM

## 2015-10-21 MED ORDER — ALBUTEROL SULFATE HFA 108 (90 BASE) MCG/ACT IN AERS: 2.0000 | INHALATION_SPRAY | Freq: Four times a day (QID) | RESPIRATORY_TRACT | Status: DC | PRN

## 2015-11-04 ENCOUNTER — Encounter: Payer: Self-pay | Admitting: Family Medicine

## 2015-11-11 ENCOUNTER — Ambulatory Visit (INDEPENDENT_AMBULATORY_CARE_PROVIDER_SITE_OTHER): Payer: 59 | Admitting: Family Medicine

## 2015-11-11 ENCOUNTER — Encounter: Payer: Self-pay | Admitting: Family Medicine

## 2015-11-11 VITALS — BP 131/79 | HR 78 | Temp 97.9°F | Resp 16 | Ht 64.5 in | Wt 174.6 lb

## 2015-11-11 DIAGNOSIS — J209 Acute bronchitis, unspecified: Secondary | ICD-10-CM

## 2015-11-11 DIAGNOSIS — Z720 Tobacco use: Secondary | ICD-10-CM | POA: Diagnosis not present

## 2015-11-11 DIAGNOSIS — R062 Wheezing: Secondary | ICD-10-CM

## 2015-11-11 MED ORDER — DOXYCYCLINE HYCLATE 100 MG PO CAPS: 100.0000 mg | ORAL_CAPSULE | Freq: Two times a day (BID) | ORAL | Status: DC

## 2015-11-11 MED ORDER — PREDNISONE 20 MG PO TABS: ORAL_TABLET | ORAL | Status: DC

## 2015-11-11 NOTE — Patient Instructions (Signed)
It was good to see you today- I hope that you continue to feel better!  Take the prednisone for 3 days and the doxycycline (antibiotic) twice a day for 10 days.  Let me know if you do not continue to feel better- you can use the albuterol as needed for wheezing  I would encourage you to quit smoking- the e cig is probably better for you but of course we do not know for sure  Your breathing test (spirometry) did show some restriction but not obstruction (like COPD)- this may be due to illness (or just technique of doing the test).   It would be smart to repeat your spirometry in a couple of months to make sure it goes back to normal

## 2015-11-11 NOTE — Progress Notes (Signed)
[facility] [address], [place] [address] [phone]  Date:  11/11/2015   Name:  Brittany Burton   DOB:  [birth year]   MRN:  [id number]  PCP:  Cathlean Cower, MD    Chief Complaint: Cough; runny nose, congestion; and Sore Throat   History of Present Illness:  Brittany Burton is a 59 y.o. very pleasant female patient who presents with the following:  History of atrial fib (one episode years ago, resolved, no further rx), vertigo, hyperlipidemia and tobacco smoking (30 pack years, currently a smoker) here today to follow-up.   She was here in October and dx with CAP- treated with levaquin.  Came back a month later and CXR was cleared, she was feeling fine.  She went to [place] after her last visit and then returned home on 11/18- up getting home she felt like she was "just as sick as I was before."  Over the weekend however she rested a lot and started to get better again.  "I feel like I turned the corner."  She notes sinus congestion, her chest sx are improved. She was wheezing and had crackles in her lungs but this now seems improved No fever.    Prior to getting sick she has no breathing problems that she was aware of- she was able to golf, walk 5 miles, etc.  Her breathing did not stop her from any activities.   She does have an inhaler that she can use if needed   Patient Active Problem List   Diagnosis Date Noted  . Left wrist pain 06/15/2014  . Smoker 01/31/2013  . Abnormal liver function tests 01/31/2013  . Meralgia paraesthetica, right 02/29/2012  . PAF (paroxysmal atrial fibrillation) ([place]) 09/04/2011  . Vertigo 09/04/2011  . Preventative health care 08/28/2011  . BACK PAIN 03/18/2010  . VITAMIN D DEFICIENCY 06/03/2009  . Hyperlipidemia 06/03/2009  . GERD 06/03/2009    Past Medical History  Diagnosis Date  . VITAMIN D DEFICIENCY 06/03/2009  . HYPERLIPIDEMIA 06/03/2009  . GERD 06/03/2009  . POSTURAL LIGHTHEADEDNESS 06/17/2010  . LEG PAIN,  RIGHT 03/18/2010  . BACK PAIN 03/18/2010  . Vertigo     chronic recurrent, BPV, probl left related  . PAF (paroxysmal atrial fibrillation) ([place]) 09/04/2011    No past surgical history on file.  Social History  Substance Use Topics  . Smoking status: Current Every Day Smoker -- 1.00 packs/day for 30 years    Types: Cigarettes  . Smokeless tobacco: None  . Alcohol Use: No     Comment: Rare    Family History  Problem Relation Age of Onset  . Melanoma Father   . Melanoma Sister   . Cancer Paternal Grandmother     colon late 70's    No Known Allergies  Medication list has been reviewed and updated.  Current Outpatient Prescriptions on File Prior to Visit  Medication Sig Dispense Refill  . albuterol (PROVENTIL HFA;VENTOLIN HFA) 108 (90 BASE) MCG/ACT inhaler Inhale 2 puffs into the lungs every 6 (six) hours as needed for wheezing or shortness of breath. 1 Inhaler 0  . aspirin 81 MG EC tablet Take 1 tablet (81 mg total) by mouth daily. Swallow whole. 30 tablet 12  . Calcium Carbonate-Vitamin D (CALCIUM + D PO) Take by mouth.    . meclizine (ANTIVERT) 25 MG tablet Take 1 tablet (25 mg total) by mouth daily as needed. 60 tablet 5  . rosuvastatin (CRESTOR) 20 MG tablet Take  1 tablet (20 mg total) by mouth daily. 90 tablet 3   No current facility-administered medications on file prior to visit.    Review of Systems:  As per HPI- otherwise negative. No exposure to industrial silica, asbestos, etc as far as she knowsx    Physical Examination: Filed Vitals:   11/11/15 1645  BP: 131/79  Pulse: 78  Temp: 97.9 F (36.6 C)  Resp: 16   Filed Vitals:   11/11/15 1645  Height: 5' 4.5" (1.638 m)  Weight: 174 lb 9.6 oz (79.198 kg)   Body mass index is 29.52 kg/(m^2). Ideal Body Weight: Weight in (lb) to have BMI = 25: 147.6  GEN: WDWN, NAD, Non-toxic, A & O x 3, overweight, looks well HEENT: Atraumatic, Normocephalic. Neck supple. No masses, No LAD.  Bilateral TM wnl, oropharynx  normal.  PEERL,EOMI.   Ears and Nose: No external deformity. CV: RRR, No M/G/R. No JVD. No thrill. No extra heart sounds. PULM: no crackles, rhonchi. No retractions. No resp. distress. No accessory muscle use. ABD: S, NT, ND, +BS. No rebound. No HSM. EXTR: No c/c/e NEURO Normal gait.  PSYCH: Normally interactive. Conversant. Not depressed or anxious appearing.  Calm demeanor.  Lungs show mild wheezing bilaterally   Spirometry- mild restriction, no obstruction noted.    Assessment and Plan: Acute bronchitis, unspecified organism - Plan: doxycycline (VIBRAMYCIN) 100 MG capsule, predniSONE (DELTASONE) 20 MG tablet  Wheezing - Plan: predniSONE (DELTASONE) 20 MG tablet  Tobacco abuse  Here today with recurrent respiratory sx and wheezing.  At this time she does not appear to have pneumonia, but does have bronchitis. Given her smoking history will treat with a short course of prednisone and 10d of doxycycline.  She does not appear to have COPD so will not start spirivia.  However did discuss importance of quitting tobacco.  Asked her to be sure and repeat her spirometry in a few months and she will plan to do so  Meds ordered this encounter  Medications  . doxycycline (VIBRAMYCIN) 100 MG capsule    Sig: Take 1 capsule (100 mg total) by mouth 2 (two) times daily.    Dispense:  20 capsule    Refill:  0  . predniSONE (DELTASONE) 20 MG tablet    Sig: Take 2 pills a day for 3 days    Dispense:  6 tablet    Refill:  0    Signed Lamar Blinks, MD

## 2016-05-25 ENCOUNTER — Ambulatory Visit (INDEPENDENT_AMBULATORY_CARE_PROVIDER_SITE_OTHER): Payer: 59 | Admitting: Gynecology

## 2016-05-25 ENCOUNTER — Encounter: Payer: Self-pay | Admitting: Gynecology

## 2016-05-25 VITALS — BP 122/80 | Ht 64.0 in | Wt 176.0 lb

## 2016-05-25 DIAGNOSIS — N952 Postmenopausal atrophic vaginitis: Secondary | ICD-10-CM | POA: Diagnosis not present

## 2016-05-25 DIAGNOSIS — Z01419 Encounter for gynecological examination (general) (routine) without abnormal findings: Secondary | ICD-10-CM

## 2016-05-25 NOTE — Progress Notes (Signed)
    Brittany Burton [birth year] [id number]        60 y.o.  G0P0  for annual exam.  Doing well without complaints  Past medical history,surgical history, problem list, medications, allergies, family history and social history were all reviewed and documented as reviewed in the EPIC chart.  ROS:  Performed with pertinent positives and negatives included in the history, assessment and plan.   Additional significant findings :  None   Exam: Leanne Lovely Vitals:   05/25/16 1153  BP: 122/80  Height: 5\' 4"  (1.626 m)  Weight: 176 lb (79.833 kg)   General appearance:  Normal affect, orientation and appearance. Skin: Grossly normal HEENT: Without gross lesions.  No cervical or supraclavicular adenopathy. Thyroid normal.  Lungs:  Clear without wheezing, rales or rhonchi Cardiac: RR, without RMG Abdominal:  Soft, nontender, without masses, guarding, rebound, organomegaly or hernia Breasts:  Examined lying and sitting without masses, retractions, discharge or axillary adenopathy. Pelvic:  Ext/BUS/vagina with atrophic changes  Cervix with atrophic changes  Uterus anteverted, normal size, shape and contour, midline and mobile nontender   Adnexa without masses or tenderness    Anus and perineum normal   Rectovaginal normal sphincter tone without palpated masses or tenderness.    Assessment/Plan:  60 y.o. G0P0 female for annual exam.   1. Postmenopausal/atrophic genital changes. Without significant hot flushes, night sweats, vaginal dryness or any vaginal bleeding. Continue to monitor report any issues or vaginal bleeding. 2. Mammography 05/2015. Due now and patient knows to schedule. SBE monthly reviewed. 3. Pap smear/HPV negative 2015. No Pap smear done today. No history of significant abnormal Pap smears. Plan repeat Pap smear approaching 5 year interval per current screening guidelines. 4. Colonoscopy due next year and I reminded her to schedule this. 5. DEXA 2011 normal.  Plan repeat DEXA next year at age 89. Increased calcium vitamin D reviewed. 6. Stop smoking again discussed and encouraged. Patient acknowledges my recommendation. 7. Health maintenance. No routine lab work done as this is done elsewhere. Follow up 1 year, sooner as needed.   Anastasio Auerbach MD, 12:10 PM 05/25/2016

## 2016-05-25 NOTE — Patient Instructions (Signed)
Consider Stop Smoking.  Help is available at [facility]'s smoking cessation program @ [email or web address] or 336-832-0838. OR 1-800-QUIT-NOW (1-800-784-8669) for free smoking cessation counseling.  Smokefree.gov (http://www.smokefree.gov) provides free, accurate, evidence-based information and professional assistance to help support the immediate and long-term needs of people trying to quit smoking.    Smoking Hazards Smoking cigarettes is extremely bad for your health. Tobacco smoke has over 200 known poisons in it. There are over 60 chemicals in tobacco smoke that cause cancer. Some of the chemicals found in cigarette smoke include:  Cyanide.  Benzene.  Formaldehyde.  Methanol (wood alcohol).  Acetylene (fuel used in welding torches).  Ammonia.  Cigarette smoke also contains the poisonous gases nitrogen oxide and carbon monoxide.  Cigarette smokers have an increased risk of many serious medical problems, including: Lung cancer.  Lung disease (such as pneumonia, bronchitis, and emphysema).  Heart attack and chest pain due to the heart not getting enough oxygen (angina).  Heart disease and peripheral blood vessel disease.  Hypertension.  Stroke.  Oral cancer (cancer of the lip, mouth, or voice box).  Bladder cancer.  Pancreatic cancer.  Cervical cancer.  Pregnancy complications, including premature birth.  Low birthweight babies.  Early menopause.  Lower estrogen level for women.  Infertility.  Facial wrinkles.  Blindness.  Increased risk of broken bones (fractures).  Senile dementia.  Stillbirths and smaller newborn babies, birth defects, and genetic damage to sperm.  Stomach ulcers and internal bleeding.  Children of smokers have an increased risk of the following, because of secondhand smoke exposure:  Sudden infant death syndrome (SIDS).  Respiratory infections.  Lung cancer.  Heart disease.  Ear infections.  Smoking causes approximately: 90% of all lung cancer  deaths in men.  80% of all lung cancer deaths in women.  90% of deaths from chronic obstructive lung disease.  Compared with nonsmokers, smoking increases the risk of: Coronary heart disease by 2 to 4 times.  Stroke by 2 to 4 times.  Men developing lung cancer by 23 times.  Women developing lung cancer by 13 times.  Dying from chronic obstructive lung diseases by 12 times.  Someone who smokes 2 packs a day loses about 8 years of his or her life. Even smoking lightly shortens your life expectancy by several years. You can greatly reduce the risk of medical problems for you and your family by stopping now. Smoking is the most preventable cause of death and disease in our society. Within days of quitting smoking, your circulation returns to normal, you decrease the risk of having a heart attack, and your lung capacity improves. There may be some increased phlegm in the first few days after quitting, and it may take months for your lungs to clear up completely. Quitting for 10 years cuts your lung cancer risk to almost that of a nonsmoker. WHY IS SMOKING ADDICTIVE? Nicotine is the chemical agent in tobacco that is capable of causing addiction or dependence.  When you smoke and inhale, nicotine is absorbed rapidly into the bloodstream through your lungs. Nicotine absorbed through the lungs is capable of creating a powerful addiction. Both inhaled and non-inhaled nicotine may be addictive.  Addiction studies of cigarettes and spit tobacco show that addiction to nicotine occurs mainly during the teen years, when young people begin using tobacco products.  WHAT ARE THE BENEFITS OF QUITTING?  There are many health benefits to quitting smoking.  Likelihood of developing cancer and heart disease decreases. Health improvements are seen almost immediately.    Blood pressure, pulse rate, and breathing patterns start returning to normal soon after quitting.  People who quit may see an improvement in their overall  quality of life.  Some people choose to quit all at once. Other options include nicotine replacement products, such as patches, gum, and nasal sprays. Do not use these products without first checking with your caregiver. QUITTING SMOKING It is not easy to quit smoking. Nicotine is addicting, and longtime habits are hard to change. To start, you can write down all your reasons for quitting, tell your family and friends you want to quit, and ask for their help. Throw your cigarettes away, chew gum or cinnamon sticks, keep your hands busy, and drink extra water or juice. Go for walks and practice deep breathing to relax. Think of all the money you are saving: around $1,000 a year, for the average pack-a-day smoker. Nicotine patches and gum have been shown to improve success at efforts to stop smoking. Zyban (bupropion) is an anti-depressant drug that can be prescribed to reduce nicotine withdrawal symptoms and to suppress the urge to smoke. Smoking is an addiction with both physical and psychological effects. Joining a stop-smoking support group can help you cope with the emotional issues. For more information and advice on programs to stop smoking, call your doctor, your local hospital, or these organizations: American [employer] Association - 1-800-LUNGUSA   Smoking Cessation  This document explains the best ways for you to quit smoking and new treatments to help. It lists new medicines that can double or triple your chances of quitting and quitting for good. It also considers ways to avoid relapses and concerns you may have about quitting, including weight gain. NICOTINE: A POWERFUL ADDICTION If you have tried to quit smoking, you know how hard it can be. It is hard because nicotine is a very addictive drug. For some people, it can be as addictive as heroin or cocaine. Usually, people make 2 or 3 tries, or more, before finally being able to quit. Each time you try to quit, you can learn about what helps and  what hurts. Quitting takes hard work and a lot of effort, but you can quit smoking. QUITTING SMOKING IS ONE OF THE MOST IMPORTANT THINGS YOU WILL EVER DO.  You will live longer, feel better, and live better.   The impact on your body of quitting smoking is felt almost immediately:   Within 20 minutes, blood pressure decreases. Pulse returns to its normal level.   After 8 hours, carbon monoxide levels in the blood return to normal. Oxygen level increases.   After 24 hours, chance of heart attack starts to decrease. Breath, hair, and body stop smelling like smoke.   After 48 hours, damaged nerve endings begin to recover. Sense of taste and smell improve.   After 72 hours, the body is virtually free of nicotine. Bronchial tubes relax and breathing becomes easier.   After 2 to 12 weeks, lungs can hold more air. Exercise becomes easier and circulation improves.   Quitting will reduce your risk of having a heart attack, stroke, cancer, or lung disease:   After 1 year, the risk of coronary heart disease is cut in half.   After 5 years, the risk of stroke falls to the same as a nonsmoker.   After 10 years, the risk of lung cancer is cut in half and the risk of other cancers decreases significantly.   After 15 years, the risk of coronary heart disease drops, usually   to the level of a nonsmoker.   If you are pregnant, quitting smoking will improve your chances of having a healthy baby.   The people you live with, especially your children, will be healthier.   You will have extra money to spend on things other than cigarettes.  FIVE KEYS TO QUITTING Studies have shown that these 5 steps will help you quit smoking and quit for good. You have the best chances of quitting if you use them together: 1. Get ready.  2. Get support and encouragement.  3. Learn new skills and behaviors.  4. Get medicine to reduce your nicotine addiction and use it correctly.  5. Be prepared for relapse or  difficult situations. Be determined to continue trying to quit, even if you do not succeed at first.  1. GET READY  Set a quit date.   Change your environment.   Get rid of ALL cigarettes, ashtrays, matches, and lighters in your home, car, and place of work.   Do not let people smoke in your home.   Review your past attempts to quit. Think about what worked and what did not.   Once you quit, do not smoke. NOT EVEN A PUFF!  2. GET SUPPORT AND ENCOURAGEMENT Studies have shown that you have a better chance of being successful if you have help. You can get support in many ways.  Tell your family, friends, and coworkers that you are going to quit and need their support. Ask them not to smoke around you.   Talk to your caregivers (doctor, dentist, nurse, pharmacist, psychologist, and/or smoking counselor).   Get individual, group, or telephone counseling and support. The more counseling you have, the better your chances are of quitting. Programs are available at local hospitals and health centers. Call your local health department for information about programs in your area.   Spiritual beliefs and practices may help some smokers quit.   Quit meters are small computer programs online or downloadable that keep track of quit statistics, such as amount of "quit-time," cigarettes not smoked, and money saved.   Many smokers find one or more of the many self-help books available useful in helping them quit and stay off tobacco.  3. LEARN NEW SKILLS AND BEHAVIORS  Try to distract yourself from urges to smoke. Talk to someone, go for a walk, or occupy your time with a task.   When you first try to quit, change your routine. Take a different route to work. Drink tea instead of coffee. Eat breakfast in a different place.   Do something to reduce your stress. Take a hot bath, exercise, or read a book.   Plan something enjoyable to do every day. Reward yourself for not smoking.   Explore  interactive web-based programs that specialize in helping you quit.  4. GET MEDICINE AND USE IT CORRECTLY Medicines can help you stop smoking and decrease the urge to smoke. Combining medicine with the above behavioral methods and support can quadruple your chances of successfully quitting smoking. The [place] Food and Drug Administration (FDA) has approved 7 medicines to help you quit smoking. These medicines fall into 3 categories.  Nicotine replacement therapy (delivers nicotine to your body without the negative effects and risks of smoking):   Nicotine gum: Available over-the-counter.   Nicotine lozenges: Available over-the-counter.   Nicotine inhaler: Available by prescription.   Nicotine nasal spray: Available by prescription.   Nicotine skin patches (transdermal): Available by prescription and over-the-counter.   Antidepressant medicine (  helps people abstain from smoking, but how this works is unknown):   Bupropion sustained-release (SR) tablets: Available by prescription.   Nicotinic receptor partial agonist (simulates the effect of nicotine in your brain):   Varenicline tartrate tablets: Available by prescription.   Ask your caregiver for advice about which medicines to use and how to use them. Carefully read the information on the package.   Everyone who is trying to quit may benefit from using a medicine. If you are pregnant or trying to become pregnant, nursing an infant, you are under age 18, or you smoke fewer than 10 cigarettes per day, talk to your caregiver before taking any nicotine replacement medicines.   You should stop using a nicotine replacement product and call your caregiver if you experience nausea, dizziness, weakness, vomiting, fast or irregular heartbeat, mouth problems with the lozenge or gum, or redness or swelling of the skin around the patch that does not go away.   Do not use any other product containing nicotine while using a nicotine replacement  product.   Talk to your caregiver before using these products if you have diabetes, heart disease, asthma, stomach ulcers, you had a recent heart attack, you have high blood pressure that is not controlled with medicine, a history of irregular heartbeat, or you have been prescribed medicine to help you quit smoking.  5. BE PREPARED FOR RELAPSE OR DIFFICULT SITUATIONS  Most relapses occur within the first 3 months after quitting. Do not be discouraged if you start smoking again. Remember, most people try several times before they finally quit.   You may have symptoms of withdrawal because your body is used to nicotine. You may crave cigarettes, be irritable, feel very hungry, cough often, get headaches, or have difficulty concentrating.   The withdrawal symptoms are only temporary. They are strongest when you first quit, but they will go away within 10 to 14 days.  Here are some difficult situations to watch for:  Alcohol. Avoid drinking alcohol. Drinking lowers your chances of successfully quitting.   Caffeine. Try to reduce the amount of caffeine you consume. It also lowers your chances of successfully quitting.   Other smokers. Being around smoking can make you want to smoke. Avoid smokers.   Weight gain. Many smokers will gain weight when they quit, usually less than 10 pounds. Eat a healthy diet and stay active. Do not let weight gain distract you from your main goal, quitting smoking. Some medicines that help you quit smoking may also help delay weight gain. You can always lose the weight gained after you quit.   Bad mood or depression. There are a lot of ways to improve your mood other than smoking.  If you are having problems with any of these situations, talk to your caregiver. SPECIAL SITUATIONS AND CONDITIONS Studies suggest that everyone can quit smoking. Your situation or condition can give you a special reason to quit.  Pregnant women/new mothers: By quitting, you protect your  baby's health and your own.   Hospitalized patients: By quitting, you reduce health problems and help healing.   Heart attack patients: By quitting, you reduce your risk of a second heart attack.   Lung, head, and neck cancer patients: By quitting, you reduce your chance of a second cancer.   Parents of children and adolescents: By quitting, you protect your children from illnesses caused by secondhand smoke.  QUESTIONS TO THINK ABOUT Think about the following questions before you try to stop smoking. You may   want to talk about your answers with your caregiver.  Why do you want to quit?   If you tried to quit in the past, what helped and what did not?   What will be the most difficult situations for you after you quit? How will you plan to handle them?   Who can help you through the tough times? Your family? Friends? Caregiver?   What pleasures do you get from smoking? What ways can you still get pleasure if you quit?  Here are some questions to ask your caregiver:  How can you help me to be successful at quitting?   What medicine do you think would be best for me and how should I take it?   What should I do if I need more help?   What is smoking withdrawal like? How can I get information on withdrawal?  Quitting takes hard work and a lot of effort, but you can quit smoking.  

## 2016-05-31 ENCOUNTER — Telehealth: Payer: Self-pay | Admitting: Internal Medicine

## 2016-05-31 ENCOUNTER — Other Ambulatory Visit: Payer: Self-pay

## 2016-05-31 ENCOUNTER — Telehealth: Payer: Self-pay

## 2016-05-31 DIAGNOSIS — Z0001 Encounter for general adult medical examination with abnormal findings: Secondary | ICD-10-CM

## 2016-05-31 MED ORDER — ROSUVASTATIN CALCIUM 20 MG PO TABS: 20.0000 mg | ORAL_TABLET | Freq: Every day | ORAL | Status: DC

## 2016-05-31 NOTE — Telephone Encounter (Signed)
Called pt and LVM letting her know that her cholesterol medication has been sent in to the pharmacy and for further refills she will need to come in for physical.

## 2016-05-31 NOTE — Telephone Encounter (Signed)
cpx orders placed; are good for approx 6 wks  Please make sure pt OV is within this time frame, thanks

## 2016-06-21 ENCOUNTER — Encounter: Payer: Self-pay | Admitting: Internal Medicine

## 2016-08-03 ENCOUNTER — Telehealth: Payer: Self-pay | Admitting: *Deleted

## 2016-08-03 MED ORDER — FLUCONAZOLE 150 MG PO TABS: 150.0000 mg | ORAL_TABLET | Freq: Once | ORAL | 0 refills | Status: AC

## 2016-08-03 NOTE — Telephone Encounter (Signed)
Pt informed, Rx sent. 

## 2016-08-03 NOTE — Telephone Encounter (Signed)
Diflucan 150 mg 1 dose okay. Office visit if symptoms persist

## 2016-08-03 NOTE — Telephone Encounter (Signed)
Pt called c/o yeast infection itching and white discharge, asking if Rx could be sent. Please advise

## 2016-08-05 ENCOUNTER — Other Ambulatory Visit: Payer: Self-pay | Admitting: Internal Medicine

## 2016-08-27 ENCOUNTER — Emergency Department
Admission: EM | Admit: 2016-08-27 | Discharge: 2016-08-27 | Disposition: A | Discharge status: Home / Self Care | Payer: 59 | Attending: Student in an Organized Health Care Education/Training Program | Admitting: Student in an Organized Health Care Education/Training Program

## 2016-08-27 ENCOUNTER — Emergency Department: Payer: 59

## 2016-08-27 ENCOUNTER — Encounter: Payer: Self-pay | Admitting: Emergency Medicine

## 2016-08-27 DIAGNOSIS — F17210 Nicotine dependence, cigarettes, uncomplicated: Secondary | ICD-10-CM | POA: Insufficient documentation

## 2016-08-27 DIAGNOSIS — K5900 Constipation, unspecified: Secondary | ICD-10-CM | POA: Diagnosis present

## 2016-08-27 DIAGNOSIS — Z7982 Long term (current) use of aspirin: Secondary | ICD-10-CM | POA: Insufficient documentation

## 2016-08-27 DIAGNOSIS — R109 Unspecified abdominal pain: Secondary | ICD-10-CM | POA: Insufficient documentation

## 2016-08-27 DIAGNOSIS — Z79899 Other long term (current) drug therapy: Secondary | ICD-10-CM | POA: Insufficient documentation

## 2016-08-27 LAB — CBC WITH DIFFERENTIAL/PLATELET
BASOS ABS: 0.1 10*3/uL (ref 0–0.1)
BASOS PCT: 1 %
EOS ABS: 0 10*3/uL (ref 0–0.7)
Eosinophils Relative: 0 %
HCT: 46.7 % (ref 35.0–47.0)
Hemoglobin: 16.4 g/dL — ABNORMAL HIGH (ref 12.0–16.0)
Lymphocytes Relative: 10 %
Lymphs Abs: 1 10*3/uL (ref 1.0–3.6)
MCH: 33 pg (ref 26.0–34.0)
MCHC: 35 g/dL (ref 32.0–36.0)
MCV: 94.1 fL (ref 80.0–100.0)
MONO ABS: 0.6 10*3/uL (ref 0.2–0.9)
MONOS PCT: 6 %
NEUTROS PCT: 83 %
Neutro Abs: 8.7 10*3/uL — ABNORMAL HIGH (ref 1.4–6.5)
Platelets: 211 10*3/uL (ref 150–440)
RBC: 4.96 MIL/uL (ref 3.80–5.20)
RDW: 12.8 % (ref 11.5–14.5)
WBC: 10.4 10*3/uL (ref 3.6–11.0)

## 2016-08-27 LAB — COMPREHENSIVE METABOLIC PANEL
ALBUMIN: 3.7 g/dL (ref 3.5–5.0)
ALT: 26 U/L (ref 14–54)
ANION GAP: 8 (ref 5–15)
AST: 24 U/L (ref 15–41)
Alkaline Phosphatase: 83 U/L (ref 38–126)
BUN: 13 mg/dL (ref 6–20)
CALCIUM: 9.4 mg/dL (ref 8.9–10.3)
CHLORIDE: 107 mmol/L (ref 101–111)
CO2: 25 mmol/L (ref 22–32)
CREATININE: 0.71 mg/dL (ref 0.44–1.00)
GFR calc Af Amer: >60 mL/min (ref >60)
GFR calc non Af Amer: >60 mL/min (ref >60)
GLUCOSE: 126 mg/dL — AB (ref 65–99)
POTASSIUM: 4.1 mmol/L (ref 3.5–5.1)
SODIUM: 140 mmol/L (ref 135–145)
TOTAL PROTEIN: 7.2 g/dL (ref 6.5–8.1)
Total Bilirubin: 0.9 mg/dL (ref 0.3–1.2)

## 2016-08-27 MED ORDER — SORBITOL 70 % SOLN
480.0000 mL | TOPICAL_OIL | Freq: Once | ORAL | Status: DC
  Filled 2016-08-27: qty 120

## 2016-08-27 MED ORDER — BISACODYL 5 MG PO TBEC: 5.0000 mg | DELAYED_RELEASE_TABLET | Freq: Every day | ORAL | 1 refills | Status: DC | PRN

## 2016-08-27 MED ORDER — POLYETHYLENE GLYCOL 3350 17 GM/SCOOP PO POWD: 1.0000 | Freq: Once | ORAL | 0 refills | Status: AC

## 2016-08-27 MED ORDER — METOCLOPRAMIDE HCL 5 MG/ML IJ SOLN
10.0000 mg | Freq: Once | INTRAMUSCULAR | Status: AC
  Administered 2016-08-27: 10 mg via INTRAVENOUS
  Filled 2016-08-27: qty 2

## 2016-08-27 MED ORDER — POLYETHYLENE GLYCOL 3350 17 G PO PACK
17.0000 g | PACK | Freq: Every day | ORAL | Status: DC
  Administered 2016-08-27: 17 g via ORAL
  Filled 2016-08-27: qty 1

## 2016-08-27 MED ORDER — IOPAMIDOL (ISOVUE-300) INJECTION 61%
30.0000 mL | Freq: Once | INTRAVENOUS | Status: AC | PRN
  Administered 2016-08-27: 30 mL via ORAL

## 2016-08-27 MED ORDER — IOPAMIDOL (ISOVUE-300) INJECTION 61%
100.0000 mL | Freq: Once | INTRAVENOUS | Status: AC | PRN
  Administered 2016-08-27: 100 mL via INTRAVENOUS

## 2016-08-27 MED ORDER — MAGNESIUM CITRATE PO SOLN
1.0000 | Freq: Once | ORAL | Status: AC
Start: 2016-08-27 — End: 2016-08-27
  Administered 2016-08-27: 1 via ORAL
  Filled 2016-08-27: qty 296

## 2016-08-27 MED ORDER — METOCLOPRAMIDE HCL 10 MG PO TABS: 10.0000 mg | ORAL_TABLET | Freq: Four times a day (QID) | ORAL | 0 refills | Status: DC | PRN

## 2016-08-27 NOTE — ED Triage Notes (Signed)
Last bowel movement was Monday. Had tried OTC meds with no relief. Vomited about 1 hr ago, this was only episode.  Not on chronic pain meds.

## 2016-08-27 NOTE — ED Provider Notes (Signed)
[facility] Emergency Department Provider Note    First MD Initiated Contact with Patient 08/27/16 1101     (approximate)  I have reviewed the triage vital signs and the nursing notes.   HISTORY  Chief Complaint Constipation    HPI Brittany Burton is a 60 y.o. female complaint of worsening abdominal distention and not moving her bowels since Monday. Patient states he had tried multiple over-the-counter medications without any relief. States that he got to the point where she had one episode of nonbilious nonbloody vomiting prior to arrival. She is not on any narcotic medications. She's never had this issue before. She is scheduled to travel to [place] tomorrow night and wanted to have this evaluated prior to that trip. She denies any fevers. No diarrhea. No sudden weight loss or weight gain. No previous history of inflammatory bowel disease.   Past Medical History:  Diagnosis Date  . BACK PAIN 03/18/2010  . GERD 06/03/2009  . HYPERLIPIDEMIA 06/03/2009  . LEG PAIN, RIGHT 03/18/2010  . PAF (paroxysmal atrial fibrillation) ([place]) 09/04/2011  . POSTURAL LIGHTHEADEDNESS 06/17/2010  . Vertigo    chronic recurrent, BPV, probl left related  . VITAMIN D DEFICIENCY 06/03/2009    Patient Active Problem List   Diagnosis Date Noted  . Left wrist pain 06/15/2014  . Smoker 01/31/2013  . Abnormal liver function tests 01/31/2013  . Meralgia paraesthetica, right 02/29/2012  . PAF (paroxysmal atrial fibrillation) ([place]) 09/04/2011  . Vertigo 09/04/2011  . Preventative health care 08/28/2011  . BACK PAIN 03/18/2010  . VITAMIN D DEFICIENCY 06/03/2009  . Hyperlipidemia 06/03/2009  . GERD 06/03/2009    History reviewed. No pertinent surgical history.  Prior to Admission medications   Medication Sig Start Date End Date Taking? Authorizing Provider  albuterol (PROVENTIL HFA;VENTOLIN HFA) 108 (90 BASE) MCG/ACT inhaler Inhale 2 puffs into the lungs every 6 (six) hours as  needed for wheezing or shortness of breath. 10/21/15   Darreld Mclean, MD  aspirin 81 MG EC tablet Take 1 tablet (81 mg total) by mouth daily. Swallow whole. 01/31/13   Biagio Borg, MD  Calcium Carbonate-Vitamin D (CALCIUM + D PO) Take by mouth.    Historical Provider, MD  meclizine (ANTIVERT) 25 MG tablet Take 1 tablet (25 mg total) by mouth daily as needed. 06/12/14   Biagio Borg, MD  rosuvastatin (CRESTOR) 20 MG tablet TAKE 1 TABLET (20 MG TOTAL) BY MOUTH DAILY. 08/05/16   Biagio Borg, MD    Allergies Review of patient's allergies indicates no known allergies.  Family History  Problem Relation Age of Onset  . Melanoma Father   . Melanoma Sister   . Cancer Paternal Grandmother     colon late 75's    Social History Social History  Substance Use Topics  . Smoking status: Current Every Day Smoker    Packs/day: 1.00    Years: 30.00    Types: Cigarettes  . Smokeless tobacco: Not on file  . Alcohol use No     Comment: Rare    Review of Systems Patient denies headaches, rhinorrhea, blurry vision, numbness, shortness of breath, chest pain, edema, cough, abdominal pain, nausea, vomiting, diarrhea, dysuria, fevers, rashes or hallucinations unless otherwise stated above in HPI. ____________________________________________   PHYSICAL EXAM:  VITAL SIGNS: Vitals:   08/27/16 1054  BP: (!) 144/91  Pulse: 86  Resp: 20  Temp: 98.3 F (36.8 C)    Constitutional: Alert and oriented. Well appearing and in no  acute distress. Eyes: Conjunctivae are normal. PERRL. EOMI. Head: Atraumatic. Nose: No congestion/rhinnorhea. Mouth/Throat: Mucous membranes are moist.  Oropharynx non-erythematous. Neck: No stridor. Painless ROM. No cervical spine tenderness to palpation Hematological/Lymphatic/Immunilogical: No cervical lymphadenopathy. Cardiovascular: Normal rate, regular rhythm. Grossly normal heart sounds.  Good peripheral circulation. Respiratory: Normal respiratory effort.  No  retractions. Lungs CTAB. Gastrointestinal: distended and mildly tender. No abdominal bruits. No CVA tenderness. No rectal masses, no fecal impaction Musculoskeletal: No lower extremity tenderness nor edema.  No joint effusions. Neurologic:  Normal speech and language. No gross focal neurologic deficits are appreciated. No gait instability. Skin:  Skin is warm, dry and intact. No rash noted. Psychiatric: Mood and affect are normal. Speech and behavior are normal.  ____________________________________________   LABS (all labs ordered are listed, but only abnormal results are displayed)  Results for orders placed or performed during the hospital encounter of 08/27/16 (from the past 24 hour(s))  CBC with Differential/Platelet     Status: Abnormal   Collection Time: 08/27/16 11:44 AM  Result Value Ref Range   WBC 10.4 3.6 - 11.0 K/uL   RBC 4.96 3.80 - 5.20 MIL/uL   Hemoglobin 16.4 (H) 12.0 - 16.0 g/dL   HCT 46.7 35.0 - 47.0 %   MCV 94.1 80.0 - 100.0 fL   MCH 33.0 26.0 - 34.0 pg   MCHC 35.0 32.0 - 36.0 g/dL   RDW 12.8 11.5 - 14.5 %   Platelets 211 150 - 440 K/uL   Neutrophils Relative % 83 %   Neutro Abs 8.7 (H) 1.4 - 6.5 K/uL   Lymphocytes Relative 10 %   Lymphs Abs 1.0 1.0 - 3.6 K/uL   Monocytes Relative 6 %   Monocytes Absolute 0.6 0.2 - 0.9 K/uL   Eosinophils Relative 0 %   Eosinophils Absolute 0.0 0 - 0.7 K/uL   Basophils Relative 1 %   Basophils Absolute 0.1 0 - 0.1 K/uL  Comprehensive metabolic panel     Status: Abnormal   Collection Time: 08/27/16 11:44 AM  Result Value Ref Range   Sodium 140 135 - 145 mmol/L   Potassium 4.1 3.5 - 5.1 mmol/L   Chloride 107 101 - 111 mmol/L   CO2 25 22 - 32 mmol/L   Glucose, Bld 126 (H) 65 - 99 mg/dL   BUN 13 6 - 20 mg/dL   Creatinine, Ser 0.71 0.44 - 1.00 mg/dL   Calcium 9.4 8.9 - 10.3 mg/dL   Total Protein 7.2 6.5 - 8.1 g/dL   Albumin 3.7 3.5 - 5.0 g/dL   AST 24 15 - 41 U/L   ALT 26 14 - 54 U/L   Alkaline Phosphatase 83 38 - 126  U/L   Total Bilirubin 0.9 0.3 - 1.2 mg/dL   GFR calc non Af Amer >60 >60 mL/min   GFR calc Af Amer >60 >60 mL/min   Anion gap 8 5 - 15   ____________________________________________ ____________________________________________  RADIOLOGY  I personally reviewed all radiographic images ordered to evaluate for the above acute complaints and reviewed radiology reports and findings.  These findings were personally discussed with the patient.  Please see medical record for radiology report.  ____________________________________________   PROCEDURES  Procedure(s) performed: none    Critical Care performed: no ____________________________________________   INITIAL IMPRESSION / ASSESSMENT AND PLAN / ED COURSE  Pertinent labs & imaging results that were available during my care of the patient were reviewed by me and considered in my medical decision making (see chart for  details).  DDX: Obstruction, colitis, constipation, obstipation, which led abnormality  Brittany Burton is a 60 y.o. who presents to the ED with constipation and abdominal distention. Rectal exam is without any evidence of fecal impaction. No rectal masses. She is afebrile and hemodynamic stable. Based on her age and new onset of constipation. CT imaging ordered to evaluate for obstructive process. We'll also provide a symptomatically treatment while here in the Er.  The patient will be placed on continuous pulse oximetry and telemetry for monitoring.  Laboratory evaluation will be sent to evaluate for the above complaints.     Clinical Course  Comment By Time  CT imaging reviewed and shows no evidence of obstruction. Will proceed with enema. Merlyn Lot, MD 10/07 [phone]  Patient with success after several enemas. I discussed options for inpatient management versus continued outpatient management. Patient states that she would prefer discharge home. Discussed signs and symptoms for which she should return discussed  need to follow primary care physician and possibly GI at a later date.  Have discussed with the patient and available family all diagnostics and treatments performed thus far and all questions were answered to the best of my ability. The patient demonstrates understanding and agreement with plan.  Merlyn Lot, MD 10/07 1536     ____________________________________________   FINAL CLINICAL IMPRESSION(S) / ED DIAGNOSES  Final diagnoses:  Constipation, unspecified constipation type      NEW MEDICATIONS STARTED DURING THIS VISIT:  New Prescriptions   No medications on file     Note:  This document was prepared using Dragon voice recognition software and may include unintentional dictation errors.    Merlyn Lot, MD 08/27/16 1540

## 2016-10-07 ENCOUNTER — Other Ambulatory Visit: Payer: Self-pay | Admitting: Internal Medicine

## 2016-11-07 ENCOUNTER — Ambulatory Visit (INDEPENDENT_AMBULATORY_CARE_PROVIDER_SITE_OTHER): Payer: 59 | Admitting: Family Medicine

## 2016-11-07 VITALS — BP 122/72 | HR 82 | Temp 98.1°F | Resp 17 | Ht 65.5 in | Wt 186.0 lb

## 2016-11-07 DIAGNOSIS — J189 Pneumonia, unspecified organism: Secondary | ICD-10-CM

## 2016-11-07 DIAGNOSIS — Z720 Tobacco use: Secondary | ICD-10-CM | POA: Diagnosis not present

## 2016-11-07 DIAGNOSIS — J432 Centrilobular emphysema: Secondary | ICD-10-CM

## 2016-11-07 DIAGNOSIS — F17210 Nicotine dependence, cigarettes, uncomplicated: Secondary | ICD-10-CM

## 2016-11-07 DIAGNOSIS — J3089 Other allergic rhinitis: Secondary | ICD-10-CM | POA: Diagnosis not present

## 2016-11-07 DIAGNOSIS — J181 Lobar pneumonia, unspecified organism: Secondary | ICD-10-CM | POA: Diagnosis not present

## 2016-11-07 MED ORDER — AZITHROMYCIN 250 MG PO TABS: ORAL_TABLET | ORAL | 0 refills | Status: DC

## 2016-11-07 NOTE — Patient Instructions (Addendum)
   IF you received an x-ray today, you will receive an invoice from [facility]. Please contact [facility] at [phone] with questions or concerns regarding your invoice.   IF you received labwork today, you will receive an invoice from [facility]. Please contact [facility] at [phone] with questions or concerns regarding your invoice.   Our billing staff will not be able to assist you with questions regarding bills from these companies.  You will be contacted with the lab results as soon as they are available. The fastest way to get your results is to activate your My Chart account. Instructions are located on the last page of this paperwork. If you have not heard from us regarding the results in 2 weeks, please contact this office.     Chronic Obstructive Pulmonary Disease Exacerbation Chronic obstructive pulmonary disease (COPD) is a common lung condition in which airflow from the lungs is limited. COPD is a general term that can be used to describe many different lung problems that limit airflow, including chronic bronchitis and emphysema. COPD exacerbations are episodes when breathing symptoms become much worse and require extra treatment. Without treatment, COPD exacerbations can be life threatening, and frequent COPD exacerbations can cause further damage to your lungs. What are the causes?  Respiratory infections.  Exposure to smoke.  Exposure to air pollution, chemical fumes, or dust. Sometimes there is no apparent cause or trigger. What increases the risk?  Smoking cigarettes.  Older age.  Frequent prior COPD exacerbations. What are the signs or symptoms?  Increased coughing.  Increased thick spit (sputum) production.  Increased wheezing.  Increased shortness of breath.  Rapid breathing.  Chest tightness. How is this diagnosed? Your medical history, a physical exam, and tests will help your health care provider make a diagnosis. Tests may  include:  A chest X-ray.  Basic lab tests.  Sputum testing.  An arterial blood gas test.  How is this treated? Depending on the severity of your COPD exacerbation, you may need to be admitted to a hospital for treatment. Some of the treatments commonly used to treat COPD exacerbations are:  Antibiotic medicines.  Bronchodilators. These are drugs that expand the air passages. They may be given with an inhaler or nebulizer. Spacer devices may be needed to help improve drug delivery.  Corticosteroid medicines.  Supplemental oxygen therapy.  Airway clearing techniques, such as noninvasive ventilation (NIV) and positive expiratory pressure (PEP). These provide respiratory support through a mask or other noninvasive device.  Follow these instructions at home:  Do not smoke. Quitting smoking is very important to prevent COPD from getting worse and exacerbations from happening as often.  Avoid exposure to all substances that irritate the airway, especially to tobacco smoke.  If you were prescribed an antibiotic medicine, finish it all even if you start to feel better.  Take all medicines as directed by your health care provider.It is important to use correct technique with inhaled medicines.  Drink enough fluids to keep your urine clear or pale yellow (unless you have a medical condition that requires fluid restriction).  Use a cool mist vaporizer. This makes it easier to clear your chest when you cough.  If you have a home nebulizer and oxygen, continue to use them as directed.  Maintain all necessary vaccinations to prevent infections.  Exercise regularly.  Eat a healthy diet.  Keep all follow-up appointments as directed by your health care provider. Get help right away if:  You have worsening shortness of breath.    You have trouble talking.  You have severe chest pain.  You have blood in your sputum.  You have a fever.  You have weakness, vomit repeatedly, or  faint.  You feel confused.  You continue to get worse. This information is not intended to replace advice given to you by your health care provider. Make sure you discuss any questions you have with your health care provider. Document Released: 09/04/2007 Document Revised: 04/14/2016 Document Reviewed: 07/12/2013 Elsevier Interactive Patient Education  2017 Elsevier Inc.  

## 2016-11-07 NOTE — Progress Notes (Signed)
Chief Complaint  Patient presents with  . Cough  . URI    HPI   Upper Respiratory Infection: Patient complains of symptoms of a URI. Symptoms include congestion and cough. Onset of symptoms was 4 weeks ago, waxing and waning since that time. She also c/o non productive cough, post nasal drip and sinus pressure for the past 4 weeks .  She is drinking plenty of fluids. Evaluation to date: none. Treatment to date: cough suppressants and decongestants. No history of seasonal allergies No asthma She is a heavy smoker.  Tobacco Smoker for 52 pack year (1.5pack for 35 years) She reports that she does not cough in the mornings She has never quit  She is not ready to quit smoking.  She has cut down on her smoking to 1/2 pack per day due to her current cough but has to use nicotine vaporizers.   Past Medical History:  Diagnosis Date  . BACK PAIN 03/18/2010  . GERD 06/03/2009  . HYPERLIPIDEMIA 06/03/2009  . LEG PAIN, RIGHT 03/18/2010  . PAF (paroxysmal atrial fibrillation) ([place]) 09/04/2011  . POSTURAL LIGHTHEADEDNESS 06/17/2010  . Vertigo    chronic recurrent, BPV, probl left related  . VITAMIN D DEFICIENCY 06/03/2009    Current Outpatient Prescriptions  Medication Sig Dispense Refill  . aspirin 81 MG EC tablet Take 1 tablet (81 mg total) by mouth daily. Swallow whole. 30 tablet 12  . Calcium Carbonate-Vitamin D (CALCIUM + D PO) Take by mouth.    . meclizine (ANTIVERT) 25 MG tablet Take 1 tablet (25 mg total) by mouth daily as needed. 60 tablet 5  . rosuvastatin (CRESTOR) 20 MG tablet TAKE 1 TABLET (20 MG TOTAL) BY MOUTH DAILY. 60 tablet 0  . albuterol (PROVENTIL HFA;VENTOLIN HFA) 108 (90 BASE) MCG/ACT inhaler Inhale 2 puffs into the lungs every 6 (six) hours as needed for wheezing or shortness of breath. (Patient not taking: Reported on 11/07/2016) 1 Inhaler 0  . azithromycin (ZITHROMAX) 250 MG tablet Take 2 tablets on day one then one tablet each day after 6 tablet 0   No current  facility-administered medications for this visit.     Allergies: No Known Allergies  No past surgical history on file.  Social History   Social History  . Marital status: Married    Spouse name: N/A  . Number of children: 0  . Years of education: N/A   Occupational History  . [employer]   Social History Main Topics  . Smoking status: Current Every Day Smoker    Packs/day: 1.00    Years: 30.00    Types: Cigarettes  . Smokeless tobacco: None  . Alcohol use No     Comment: Rare  . Drug use: No  . Sexual activity: Yes    Birth control/ protection: Post-menopausal     Comment: 1st intercourse 60 yo-Fewer than 5 partners   Other Topics Concern  . None   Social History Narrative  . None    ROS  Objective: Vitals:   11/07/16 0922  BP: 122/72  Pulse: 82  Resp: 17  Temp: 98.1 F (36.7 C)  TempSrc: Oral  SpO2: 95%  Weight: 186 lb (84.4 kg)  Height: 5' 5.5" (1.664 m)    Physical Exam General: alert, oriented, in NAD Head: normocephalic, atraumatic, no sinus tenderness Eyes: EOM intact, no scleral icterus or conjunctival injection Ears: TM clear bilaterally Throat: no pharyngeal exudate or erythema Lymph: no posterior auricular, submental or cervical lymph adenopathy Heart: normal rate,  normal sinus rhythm, no murmurs Lungs: bibasilar wheezing noted  Final [99] 10/13/2015 7:56 AM 10/13/2015 8:22 AM  PACS Images   Show images for CT CHEST LUNG CA SCREEN LOW DOSE W/O CM  Study Result   CLINICAL DATA:  Seventy pack-year smoking history. Smoker. Asymptomatic.  EXAM: CT CHEST WITHOUT CONTRAST  TECHNIQUE: Multidetector CT imaging of the chest was performed following the standard protocol without IV contrast.  COMPARISON:  Chest radiograph of 09/23/2015.  No prior CT.  FINDINGS: Mediastinum/Nodes: Normal heart size, without pericardial effusion. No middle mediastinal adenopathy. Hilar regions poorly evaluated without intravenous  contrast. Upper normal size prevascular node is favored to be reactive.  Lungs/Pleura: No pleural fluid. Mild to moderate centrilobular emphysema. Right-sided pulmonary nodules which measure maximally 3 mm.  Upper abdomen: Normal imaged portions of the liver, spleen, stomach, pancreas, adrenal glands, kidneys.  Musculoskeletal: Mid thoracic spondylosis.  IMPRESSION: 1. Lung-RADS Category 2, benign appearance or behavior. Continue annual screening with low-dose chest CT without contrast in 12 months. Centrilobular emphysema with small pulmonary nodules. 2.  No acute process in the chest.   Electronically Signed   By: Abigail Miyamoto M.D.   On: 10/13/2015 09:06     Assessment and Plan Jaleisa was seen today for cough and uri.  Diagnoses and all orders for this visit:  Acute allergic rhinitis due to other allergen, unspecified seasonality Community acquired pneumonia of right lower lobe of lung ([place]) Centrilobular emphysema ([place])-   Tobacco abuse -     CT CHEST LUNG CA SCREEN LOW DOSE W/O CM; Future  Smoking greater than 30 pack years -     CT CHEST LUNG CA SCREEN LOW DOSE W/O CM; Future  Other orders -     azithromycin (ZITHROMAX) 250 MG tablet; Take 2 tablets on day one then one tablet each day after  Medical Decision Making Discussed that allergic rhinitis can lead to postnasal drip May benefit from antihistamine reviewed ct done 09/2015 and advised pt to follow up for repeat CT for lung cancer screening since she is Lung RAD2  Discussed that smoking cessation is of the utmost importance Since she has mild emphysema this could be the reason for the wheezing on exam Will treat empirically for pneumonia due to this episode of chronic cough Discussed that she may need inhalers going forward Pt is in precontemplation for smoking cessation     Teneshia Hedeen A Nolon Rod

## 2016-11-28 ENCOUNTER — Encounter: Payer: Self-pay | Admitting: Family Medicine

## 2016-11-30 ENCOUNTER — Encounter: Payer: Self-pay | Admitting: Family Medicine

## 2016-11-30 ENCOUNTER — Ambulatory Visit (INDEPENDENT_AMBULATORY_CARE_PROVIDER_SITE_OTHER): Payer: 59 | Admitting: Family Medicine

## 2016-11-30 VITALS — BP 124/72 | HR 102 | Temp 97.5°F | Resp 16 | Ht 65.5 in | Wt 179.0 lb

## 2016-11-30 DIAGNOSIS — J432 Centrilobular emphysema: Secondary | ICD-10-CM | POA: Diagnosis not present

## 2016-11-30 DIAGNOSIS — Z720 Tobacco use: Secondary | ICD-10-CM

## 2016-11-30 DIAGNOSIS — R0989 Other specified symptoms and signs involving the circulatory and respiratory systems: Secondary | ICD-10-CM | POA: Diagnosis not present

## 2016-11-30 MED ORDER — ALBUTEROL SULFATE HFA 108 (90 BASE) MCG/ACT IN AERS: 2.0000 | INHALATION_SPRAY | Freq: Four times a day (QID) | RESPIRATORY_TRACT | 3 refills | Status: DC | PRN

## 2016-11-30 MED ORDER — FLUTICASONE-SALMETEROL 250-50 MCG/DOSE IN AEPB: 1.0000 | INHALATION_SPRAY | Freq: Two times a day (BID) | RESPIRATORY_TRACT | 3 refills | Status: DC

## 2016-11-30 NOTE — Progress Notes (Signed)
Chief Complaint  Patient presents with  . Cough    HPI Upper Respiratory Infection: Patient complains of symptoms of a URI.  She was seen for this 11/07/16.  Symptoms include congestion and cough. Onset of symptoms was 6 weeks ago, waxing and waning since that time. She also c/o non productive cough, post nasal drip and sinus pressure for 6-8 weeks.  She is drinking plenty of fluids. Evaluation to date: none. Treatment to date: cough suppressants and decongestants. No history of seasonal allergies No asthma She is a heavy smoker. She has a one week follow for CT chest lung protocol on 12/08/2016. She denies fevers or chills.   Tobacco Smoker for 52 pack year (1.5pack for 35 years) She reports that she does not cough in the mornings She has never quit  She is not ready to quit smoking.  she has not been able to cut down on her smoking.    Past Medical History:  Diagnosis Date  . BACK PAIN 03/18/2010  . GERD 06/03/2009  . HYPERLIPIDEMIA 06/03/2009  . LEG PAIN, RIGHT 03/18/2010  . PAF (paroxysmal atrial fibrillation) ([place]) 09/04/2011  . POSTURAL LIGHTHEADEDNESS 06/17/2010  . Vertigo    chronic recurrent, BPV, probl left related  . VITAMIN D DEFICIENCY 06/03/2009    Current Outpatient Prescriptions  Medication Sig Dispense Refill  . albuterol (PROVENTIL HFA;VENTOLIN HFA) 108 (90 Base) MCG/ACT inhaler Inhale 2 puffs into the lungs every 6 (six) hours as needed for wheezing or shortness of breath. 1 Inhaler 3  . aspirin 81 MG EC tablet Take 1 tablet (81 mg total) by mouth daily. Swallow whole. 30 tablet 12  . azithromycin (ZITHROMAX) 250 MG tablet Take 2 tablets on day one then one tablet each day after 6 tablet 0  . Calcium Carbonate-Vitamin D (CALCIUM + D PO) Take by mouth.    . meclizine (ANTIVERT) 25 MG tablet Take 1 tablet (25 mg total) by mouth daily as needed. 60 tablet 5  . rosuvastatin (CRESTOR) 20 MG tablet TAKE 1 TABLET (20 MG TOTAL) BY MOUTH DAILY. 60 tablet 0  .  Fluticasone-Salmeterol (ADVAIR DISKUS) 250-50 MCG/DOSE AEPB Inhale 1 puff into the lungs 2 (two) times daily. 1 each 3   No current facility-administered medications for this visit.     Allergies: No Known Allergies  No past surgical history on file.  Social History   Social History  . Marital status: Married    Spouse name: N/A  . Number of children: 0  . Years of education: N/A   Occupational History  . [employer]   Social History Main Topics  . Smoking status: Current Every Day Smoker    Packs/day: 1.00    Years: 30.00    Types: Cigarettes  . Smokeless tobacco: Never Used  . Alcohol use No     Comment: Rare  . Drug use: No  . Sexual activity: Yes    Birth control/ protection: Post-menopausal     Comment: 1st intercourse 61 yo-Fewer than 5 partners   Other Topics Concern  . None   Social History Narrative  . None    ROS See hpi  Objective: Vitals:   11/30/16 1658  BP: 124/72  Pulse: (!) 102  Resp: 16  Temp: 97.5 F (36.4 C)  TempSrc: Oral  SpO2: 95%  Weight: 179 lb (81.2 kg)  Height: 5' 5.5" (1.664 m)    Physical Exam General: alert, oriented, in NAD Head: normocephalic, atraumatic, no sinus tenderness Eyes: EOM intact, no  scleral icterus or conjunctival injection Ears: TM clear bilaterally Throat: no pharyngeal exudate or erythema Lymph: no posterior auricular, submental or cervical lymph adenopathy Heart: normal rate, normal sinus rhythm, no murmurs Lungs: clear to auscultation bilaterally, no wheezing   Assessment and Plan Tarron was seen today for cough.  Diagnoses and all orders for this visit:  Tobacco abuse- pt not ready to quit smoking  Chest congestion- albuterol for symptomatic relief  -     albuterol (PROVENTIL HFA;VENTOLIN HFA) 108 (90 Base) MCG/ACT inhaler; Inhale 2 puffs into the lungs every 6 (six) hours as needed for wheezing or shortness of breath.  Centrilobular emphysema ([place])- previous CT lung shows  diagnosis of centrilobular emphysema Will add advair daily and repeat CT for lung cancer screen due to heavy tobacco use Pt not ready to quit as yet -     Fluticasone-Salmeterol (ADVAIR DISKUS) 250-50 MCG/DOSE AEPB; Inhale 1 puff into the lungs 2 (two) times daily.     [place]

## 2016-12-08 ENCOUNTER — Inpatient Hospital Stay: Admission: RE | Admit: 2016-12-08 | Payer: 59 | Source: Ambulatory Visit

## 2016-12-15 ENCOUNTER — Ambulatory Visit
Admission: RE | Admit: 2016-12-15 | Discharge: 2016-12-15 | Disposition: A | Discharge status: Home / Self Care | Payer: 59 | Source: Ambulatory Visit | Attending: Family Medicine | Admitting: Family Medicine

## 2016-12-15 ENCOUNTER — Encounter: Payer: Self-pay | Admitting: Family Medicine

## 2016-12-15 DIAGNOSIS — Z720 Tobacco use: Secondary | ICD-10-CM

## 2016-12-15 DIAGNOSIS — F17210 Nicotine dependence, cigarettes, uncomplicated: Secondary | ICD-10-CM

## 2016-12-15 DIAGNOSIS — J432 Centrilobular emphysema: Secondary | ICD-10-CM | POA: Insufficient documentation

## 2017-05-15 ENCOUNTER — Telehealth: Payer: Self-pay | Admitting: Internal Medicine

## 2017-05-15 DIAGNOSIS — Z0000 Encounter for general adult medical examination without abnormal findings: Secondary | ICD-10-CM

## 2017-05-15 NOTE — Telephone Encounter (Signed)
Entered standard CPX labs...Johny Chess

## 2017-05-15 NOTE — Telephone Encounter (Signed)
Pt would like orders for blood work  put in for her cpe on wednesday

## 2017-05-17 ENCOUNTER — Encounter: Payer: Self-pay | Admitting: Internal Medicine

## 2017-05-17 ENCOUNTER — Telehealth: Payer: Self-pay

## 2017-05-17 ENCOUNTER — Other Ambulatory Visit (INDEPENDENT_AMBULATORY_CARE_PROVIDER_SITE_OTHER): Payer: 59

## 2017-05-17 DIAGNOSIS — Z0000 Encounter for general adult medical examination without abnormal findings: Secondary | ICD-10-CM

## 2017-05-17 DIAGNOSIS — R739 Hyperglycemia, unspecified: Secondary | ICD-10-CM

## 2017-05-17 LAB — LIPID PANEL
CHOLESTEROL: 141 mg/dL (ref 0–200)
HDL: 37.3 mg/dL — AB (ref >39.00)
LDL Cholesterol: 87 mg/dL (ref 0–99)
NonHDL: 104
TRIGLYCERIDES: 86 mg/dL (ref 0.0–149.0)
Total CHOL/HDL Ratio: 4
VLDL: 17.2 mg/dL (ref 0.0–40.0)

## 2017-05-17 LAB — TSH: TSH: 3.87 u[IU]/mL (ref 0.35–4.50)

## 2017-05-17 LAB — URINALYSIS, ROUTINE W REFLEX MICROSCOPIC
Bilirubin Urine: NEGATIVE
HGB URINE DIPSTICK: NEGATIVE
Ketones, ur: NEGATIVE
Leukocytes, UA: NEGATIVE
NITRITE: NEGATIVE
PH: 6 (ref 5.0–8.0)
RBC / HPF: NONE SEEN (ref 0–?)
TOTAL PROTEIN, URINE-UPE24: NEGATIVE
Urine Glucose: NEGATIVE
Urobilinogen, UA: 0.2 (ref 0.0–1.0)
WBC, UA: NONE SEEN (ref 0–?)

## 2017-05-17 LAB — CBC WITH DIFFERENTIAL/PLATELET
BASOS PCT: 0.4 % (ref 0.0–3.0)
Basophils Absolute: 0 10*3/uL (ref 0.0–0.1)
EOS ABS: 0.1 10*3/uL (ref 0.0–0.7)
EOS PCT: 1.9 % (ref 0.0–5.0)
HCT: 43.5 % (ref 36.0–46.0)
Hemoglobin: 15 g/dL (ref 12.0–15.0)
LYMPHS ABS: 2.3 10*3/uL (ref 0.7–4.0)
Lymphocytes Relative: 35.9 % (ref 12.0–46.0)
MCHC: 34.5 g/dL (ref 30.0–36.0)
MCV: 94.9 fl (ref 78.0–100.0)
MONO ABS: 0.6 10*3/uL (ref 0.1–1.0)
Monocytes Relative: 9.7 % (ref 3.0–12.0)
NEUTROS ABS: 3.3 10*3/uL (ref 1.4–7.7)
NEUTROS PCT: 52.1 % (ref 43.0–77.0)
PLATELETS: 204 10*3/uL (ref 150.0–400.0)
RBC: 4.59 Mil/uL (ref 3.87–5.11)
RDW: 12.5 % (ref 11.5–15.5)
WBC: 6.3 10*3/uL (ref 4.0–10.5)

## 2017-05-17 LAB — BASIC METABOLIC PANEL
BUN: 19 mg/dL (ref 6–23)
CHLORIDE: 107 meq/L (ref 96–112)
CO2: 27 mEq/L (ref 19–32)
CREATININE: 0.81 mg/dL (ref 0.40–1.20)
Calcium: 9.4 mg/dL (ref 8.4–10.5)
GFR: 76.43 mL/min (ref >60.00)
GLUCOSE: 148 mg/dL — AB (ref 70–99)
POTASSIUM: 4.5 meq/L (ref 3.5–5.1)
Sodium: 141 mEq/L (ref 135–145)

## 2017-05-17 LAB — HEMOGLOBIN A1C: Hgb A1c MFr Bld: 5.8 % (ref 4.6–6.5)

## 2017-05-17 LAB — HEPATIC FUNCTION PANEL
ALBUMIN: 3.8 g/dL (ref 3.5–5.2)
ALK PHOS: 89 U/L (ref 39–117)
ALT: 32 U/L (ref 0–35)
AST: 34 U/L (ref 0–37)
Bilirubin, Direct: 0.1 mg/dL (ref 0.0–0.3)
Total Bilirubin: 0.6 mg/dL (ref 0.2–1.2)
Total Protein: 6.2 g/dL (ref 6.0–8.3)

## 2017-05-17 NOTE — Telephone Encounter (Signed)
-----   Message from Biagio Borg, MD sent at 05/17/2017  1:08 PM EDT ----- Brittany Burton for lab addon - Hgba1c - hyperglycemia

## 2017-05-17 NOTE — Telephone Encounter (Signed)
Done

## 2017-05-18 ENCOUNTER — Ambulatory Visit (INDEPENDENT_AMBULATORY_CARE_PROVIDER_SITE_OTHER): Payer: 59 | Admitting: Internal Medicine

## 2017-05-18 ENCOUNTER — Encounter: Payer: Self-pay | Admitting: Internal Medicine

## 2017-05-18 VITALS — BP 122/84 | HR 89 | Ht 65.5 in | Wt 183.0 lb

## 2017-05-18 DIAGNOSIS — E559 Vitamin D deficiency, unspecified: Secondary | ICD-10-CM | POA: Diagnosis not present

## 2017-05-18 DIAGNOSIS — R739 Hyperglycemia, unspecified: Secondary | ICD-10-CM | POA: Diagnosis not present

## 2017-05-18 DIAGNOSIS — Z0000 Encounter for general adult medical examination without abnormal findings: Secondary | ICD-10-CM | POA: Diagnosis not present

## 2017-05-18 DIAGNOSIS — F17200 Nicotine dependence, unspecified, uncomplicated: Secondary | ICD-10-CM | POA: Diagnosis not present

## 2017-05-18 DIAGNOSIS — R42 Dizziness and giddiness: Secondary | ICD-10-CM

## 2017-05-18 DIAGNOSIS — Z114 Encounter for screening for human immunodeficiency virus [HIV]: Secondary | ICD-10-CM

## 2017-05-18 DIAGNOSIS — J449 Chronic obstructive pulmonary disease, unspecified: Secondary | ICD-10-CM

## 2017-05-18 HISTORY — DX: Chronic obstructive pulmonary disease, unspecified: J44.9

## 2017-05-18 MED ORDER — ROSUVASTATIN CALCIUM 20 MG PO TABS: 20.0000 mg | ORAL_TABLET | Freq: Every day | ORAL | 3 refills | Status: DC

## 2017-05-18 MED ORDER — MECLIZINE HCL 25 MG PO TABS: 25.0000 mg | ORAL_TABLET | Freq: Every day | ORAL | 3 refills | Status: DC | PRN

## 2017-05-18 NOTE — Patient Instructions (Addendum)
Please continue all other medications as before, and refills have been done if requested.  Please have the pharmacy call with any other refills you may need.  Please continue your efforts at being more active, low cholesterol diet, and weight control.  You are otherwise up to date with prevention measures today.  Please keep your appointments with your specialists as you may have planned  You will be contacted regarding the referral for: colonoscopy, and a  Type of Physical Therapy called Vestibular Rehabilitation  Please quit smoking  Please return in 1 year for your yearly visit, or sooner if needed, with Lab testing done 3-5 days before

## 2017-05-18 NOTE — Progress Notes (Signed)
Subjective:    Patient ID: Brittany Burton, female    DOB: 1956-10-24, 61 y.o.   MRN: [id number]  HPI Here for wellness and f/u;  Overall doing ok;  Pt denies Chest pain, worsening SOB, DOE, wheezing, orthopnea, PND, worsening LE edema, palpitations, dizziness or syncope.  Pt denies neurological change such as new headache, facial or extremity weakness.  Pt denies polydipsia, polyuria, or low sugar symptoms. Pt states overall good compliance with treatment and medications, good tolerability, and has been trying to follow appropriate diet.  Pt denies worsening depressive symptoms, suicidal ideation or panic. No fever, night sweats, wt loss, loss of appetite, or other constitutional symptoms.  Pt states good ability with ADL's, has low fall risk, home safety reviewed and adequate, no other significant changes in hearing or vision, and only occasionally active with exercise, now with much less golf and walking than in years past.  May plan to retire soon.  Still has significant vertigo for last 6 yrs Wt Readings from Last 3 Encounters:  05/18/17 183 lb (83 kg)  11/30/16 179 lb (81.2 kg)  11/07/16 186 lb (84.4 kg)  Due for GYN next month, and mammogram in august. Past Medical History:  Diagnosis Date  . BACK PAIN 03/18/2010  . COPD (chronic obstructive pulmonary disease) ([place]) 05/18/2017  . GERD 06/03/2009  . HYPERLIPIDEMIA 06/03/2009  . LEG PAIN, RIGHT 03/18/2010  . PAF (paroxysmal atrial fibrillation) ([place]) 09/04/2011  . POSTURAL LIGHTHEADEDNESS 06/17/2010  . Vertigo    chronic recurrent, BPV, probl left related  . VITAMIN D DEFICIENCY 06/03/2009   No past surgical history on file.  reports that she has been smoking Cigarettes.  She has a 30.00 pack-year smoking history. She has never used smokeless tobacco. She reports that she does not drink alcohol or use drugs. family history includes Cancer in her paternal grandmother; Melanoma in her father and sister. No Known Allergies Current  Outpatient Prescriptions on File Prior to Visit  Medication Sig Dispense Refill  . albuterol (PROVENTIL HFA;VENTOLIN HFA) 108 (90 Base) MCG/ACT inhaler Inhale 2 puffs into the lungs every 6 (six) hours as needed for wheezing or shortness of breath. 1 Inhaler 3  . aspirin 81 MG EC tablet Take 1 tablet (81 mg total) by mouth daily. Swallow whole. 30 tablet 12  . Calcium Carbonate-Vitamin D (CALCIUM + D PO) Take by mouth.    . Fluticasone-Salmeterol (ADVAIR DISKUS) 250-50 MCG/DOSE AEPB Inhale 1 puff into the lungs 2 (two) times daily. 1 each 3   No current facility-administered medications on file prior to visit.    Review of Systems Constitutional: Negative for other unusual diaphoresis, sweats, appetite or weight changes HENT: Negative for other worsening hearing loss, ear pain, facial swelling, mouth sores or neck stiffness.   Eyes: Negative for other worsening pain, redness or other visual disturbance.  Respiratory: Negative for other stridor or swelling Cardiovascular: Negative for other palpitations or other chest pain  Gastrointestinal: Negative for worsening diarrhea or loose stools, blood in stool, distention or other pain Genitourinary: Negative for hematuria, flank pain or other change in urine volume.  Musculoskeletal: Negative for myalgias or other joint swelling.  Skin: Negative for other color change, or other wound or worsening drainage.  Neurological: Negative for other syncope or numbness. Hematological: Negative for other adenopathy or swelling Psychiatric/Behavioral: Negative for hallucinations, other worsening agitation, SI, self-injury, or new decreased concentration All other system neg per pt    Objective:   Physical Exam BP 122/84  Pulse 89   Ht 5' 5.5" (1.664 m)   Wt 183 lb (83 kg)   SpO2 98%   BMI 29.99 kg/m  VS noted,  Constitutional: Pt is oriented to person, place, and time. Appears well-developed and well-nourished, in no significant distress and  comfortable Head: Normocephalic and atraumatic  Eyes: Conjunctivae and EOM are normal. Pupils are equal, round, and reactive to light Right Ear: External ear normal without discharge Left Ear: External ear normal without discharge Nose: Nose without discharge or deformity Mouth/Throat: Oropharynx is without other ulcerations and moist  Neck: Normal range of motion. Neck supple. No JVD present. No tracheal deviation present or significant neck LA or mass Cardiovascular: Normal rate, regular rhythm, normal heart sounds and intact distal pulses.   Pulmonary/Chest: WOB normal and breath sounds without rales or wheezing  Abdominal: Soft. Bowel sounds are normal. NT. No HSM  Musculoskeletal: Normal range of motion. Exhibits no edema Lymphadenopathy: Has no other cervical adenopathy.  Neurological: Pt is alert and oriented to person, place, and time. Pt has normal reflexes. No cranial nerve deficit. Motor grossly intact, Gait intact Skin: Skin is warm and dry. No rash noted or new ulcerations Psychiatric:  Has normal mood and affect. Behavior is normal without agitation No other exam findings  Lab Results  Component Value Date   WBC 6.3 05/17/2017   HGB 15.0 05/17/2017   HCT 43.5 05/17/2017   PLT 204.0 05/17/2017   GLUCOSE 148 (H) 05/17/2017   CHOL 141 05/17/2017   TRIG 86.0 05/17/2017   HDL 37.30 (L) 05/17/2017   LDLDIRECT 145.2 01/28/2013   LDLCALC 87 05/17/2017   ALT 32 05/17/2017   AST 34 05/17/2017   NA 141 05/17/2017   K 4.5 05/17/2017   CL 107 05/17/2017   CREATININE 0.81 05/17/2017   BUN 19 05/17/2017   CO2 27 05/17/2017   TSH 3.87 05/17/2017   HGBA1C 5.8 05/17/2017       Assessment & Plan:

## 2017-05-21 NOTE — Assessment & Plan Note (Signed)

## 2017-05-21 NOTE — Assessment & Plan Note (Signed)
stable overall by history and exam, and pt to continue medical treatment as before,  to f/u any worsening symptoms or concerns 

## 2017-05-21 NOTE — Assessment & Plan Note (Signed)
[place] for referral Vestibular rehab

## 2017-05-21 NOTE — Assessment & Plan Note (Signed)
Ok for f/u lab today 

## 2017-05-21 NOTE — Assessment & Plan Note (Signed)
Counseled to quit, declines chantix 

## 2017-05-21 NOTE — Assessment & Plan Note (Signed)
Asympt, stable overall by history and exam, and pt to continue medical treatment as before,  to f/u any worsening symptoms or concerns Lab Results  Component Value Date   HGBA1C 5.8 05/17/2017

## 2017-06-12 ENCOUNTER — Encounter: Payer: 59 | Admitting: Gynecology

## 2017-06-20 ENCOUNTER — Encounter: Payer: Self-pay | Admitting: Gynecology

## 2017-06-20 ENCOUNTER — Ambulatory Visit (INDEPENDENT_AMBULATORY_CARE_PROVIDER_SITE_OTHER): Payer: 59 | Admitting: Gynecology

## 2017-06-20 VITALS — BP 120/82 | Ht 64.0 in | Wt 179.0 lb

## 2017-06-20 DIAGNOSIS — N952 Postmenopausal atrophic vaginitis: Secondary | ICD-10-CM | POA: Diagnosis not present

## 2017-06-20 DIAGNOSIS — Z01411 Encounter for gynecological examination (general) (routine) with abnormal findings: Secondary | ICD-10-CM

## 2017-06-20 NOTE — Patient Instructions (Signed)
Followup for bone density as scheduled. 

## 2017-06-20 NOTE — Progress Notes (Signed)
    Brittany Burton [birth year] [id number]        61 y.o.  G0P0 for annual exam.    Past medical history,surgical history, problem list, medications, allergies, family history and social history were all reviewed and documented as reviewed in the EPIC chart.  ROS:  Performed with pertinent positives and negatives included in the history, assessment and plan.   Additional significant findings :  None   Exam: Scientist, physiological Vitals:   06/20/17 1210  BP: 120/82  Weight: 179 lb (81.2 kg)  Height: 5\' 4"  (1.626 m)   Body mass index is 30.73 kg/m.  General appearance:  Normal affect, orientation and appearance. Skin: Grossly normal HEENT: Without gross lesions.  No cervical or supraclavicular adenopathy. Thyroid normal.  Lungs:  Clear without wheezing, rales or rhonchi Cardiac: RR, without RMG Abdominal:  Soft, nontender, without masses, guarding, rebound, organomegaly or hernia Breasts:  Examined lying and sitting without masses, retractions, discharge or axillary adenopathy. Pelvic:  Ext, BUS, Vagina: With atrophic changes  Cervix: With atrophic changes  Uterus: Anteverted, normal size, shape and contour, midline and mobile nontender   Adnexa: Without masses or tenderness    Anus and perineum: Normal   Rectovaginal: Normal sphincter tone without palpated masses or tenderness.    Assessment/Plan:  61 y.o. G0P0 female for annual exam.   1. Postmenopausal/atrophic genital changes. No significant hot flushes, night sweats, vaginal dryness or any vaginal bleeding. Continue to monitor report any issues or bleeding. 2. Pap smear/HPV 2015. No Pap smear done today. No history of significant abnormal Pap smears. Plan repeat Pap smear at 5 year interval per current screening guidelines. 3. DEXA 2011 normal. Plan repeat DEXA now and patient agrees to schedule follow up for this. She is a smoker I think a baseline now is appropriate. 4. Mammography scheduled in 3 weeks. Breast exam normal  today. SBE monthly reviewed. 5. Colonoscopy scheduled in November. 6. Health maintenance. No routine lab work done as patient reports this done elsewhere. Continues to smoke despite counseling from various providers on numerous occasions. Follow up in one year, sooner as needed.   Anastasio Auerbach MD, 12:34 PM 06/20/2017

## 2017-06-21 ENCOUNTER — Encounter: Payer: Self-pay | Admitting: Physical Therapy

## 2017-06-21 ENCOUNTER — Ambulatory Visit: Payer: 59 | Attending: Internal Medicine | Admitting: Physical Therapy

## 2017-06-21 DIAGNOSIS — H8111 Benign paroxysmal vertigo, right ear: Secondary | ICD-10-CM | POA: Diagnosis present

## 2017-06-21 DIAGNOSIS — R2681 Unsteadiness on feet: Secondary | ICD-10-CM | POA: Diagnosis present

## 2017-06-21 DIAGNOSIS — R42 Dizziness and giddiness: Secondary | ICD-10-CM

## 2017-06-21 NOTE — Therapy (Signed)
[facility] [address] [place], [place], [address] Phone: [phone]   Fax:  [phone]  Physical Therapy Evaluation  Patient Details  Name: Brittany Burton MRN: [id number] Date of Birth: May 07, 1956 Referring Provider: Biagio Borg, MD  Encounter Date: 06/21/2017      PT End of Session - 06/21/17 1708    Visit Number 1   Number of Visits 9   Date for PT Re-Evaluation 08/20/17   Authorization Type [employer]-60 visit limit combined   Authorization - Visit Number 1   Authorization - Number of Visits 69   PT Start Time 4481   PT Stop Time 0933   PT Time Calculation (min) 46 min   Activity Tolerance Other (comment)  anxiety   Behavior During Therapy Anxious      Past Medical History:  Diagnosis Date  . BACK PAIN 03/18/2010  . COPD (chronic obstructive pulmonary disease) ([place]) 05/18/2017  . GERD 06/03/2009  . HYPERLIPIDEMIA 06/03/2009  . LEG PAIN, RIGHT 03/18/2010  . PAF (paroxysmal atrial fibrillation) ([place]) 09/04/2011  . POSTURAL LIGHTHEADEDNESS 06/17/2010  . Vertigo    chronic recurrent, BPV, probl left related  . VITAMIN D DEFICIENCY 06/03/2009    History reviewed. No pertinent surgical history.  There were no vitals filed for this visit.       Subjective Assessment - 06/21/17 0853    Subjective 15-16 years of intermittent episodes of acute vertigo; has been assessed by ENT and diagnosed with BPPV.  Has performed Epley's with improvement in symptoms.  Now pt has intermittent episodes of vertigo when coming up from supine on couch, does not sleep flat in bed-HOB is propped up and sleeps on multiple pillows.  Also reports dizziness when looking up quickly at clock or in supermarket.   Patient Stated Goals Anything would be an improvement!   Currently in Pain? No/denies            [facility] PT Assessment - 06/21/17 0858      Assessment   Medical Diagnosis chronic vertigo   Referring Provider Biagio Borg, MD    Onset Date/Surgical Date --  15 years ago   Next MD Visit unknown   Prior Therapy None     Precautions   Precaution Comments back pain, Paroxysmal Afib and COPD     Balance Screen   Has the patient fallen in the past 6 months No   Has the patient had a decrease in activity level because of a fear of falling?  No   Is the patient reluctant to leave their home because of a fear of falling?  No     Prior Function   Vocation Full time employment     Observation/Other Assessments   Focus on Therapeutic Outcomes (FOTO)  86 (14% limited; predicted 14% limited)   Other Surveys  Other Surveys   Dizziness Handicap Inventory [facility])  32 Mild     Standardized Balance Assessment   Standardized Balance Assessment 10 meter walk test   10 Meter Walk 3.8 ft/sec-WFL-no goal indicated            Vestibular Assessment - 06/21/17 0859      Vestibular Assessment   General Observation chronic vertigo; denies headaches, changes in vision or hearing, aural fullness or tinnitus, denies nausea and vomiting.  Gait speed 8.53 seconds or 3.84 ft/sec      Symptom Behavior   Type of Dizziness Spinning   Frequency of Dizziness daily   Duration of  Dizziness seconds   Aggravating Factors Looking up to the ceiling;Sitting with head tilted back;Supine to sit   Relieving Factors Closing eyes     Occulomotor Exam   Occulomotor Alignment Normal   Spontaneous Absent   Gaze-induced Absent   Smooth Pursuits Intact  dizziness with upward gaze   Saccades Intact   Comment Convergence impaired     Vestibulo-Occular Reflex   VOR 1 Head Only (x 1 viewing) Intact   VOR to Slow Head Movement Normal   Comment HIT: + to R side     Visual Acuity   Static 8   Dynamic 0  unable to complete due to imbalance     Positional Testing   Sidelying Test Sidelying Right;Sidelying Left   Horizontal Canal Testing Horizontal Canal Right;Horizontal Canal Left     Sidelying Right   Sidelying Right Duration 0    Sidelying Right Symptoms No nystagmus     Sidelying Left   Sidelying Left Duration >30 seconds   Sidelying Left Symptoms Downbeat, left rotatory nystagmus     Horizontal Canal Right   Horizontal Canal Right Duration 0   Horizontal Canal Right Symptoms Normal     Horizontal Canal Left   Horizontal Canal Left Duration 0   Horizontal Canal Left Symptoms Normal        Objective measurements completed on examination: See above findings.           Vestibular Treatment/Exercise - 06/21/17 1100      Vestibular Treatment/Exercise   Habituation Exercises Legrand Como Daroff   Number of Reps  0   Symptom Description  demonstrated to pt only; will provide with handout and review next session       Habituation - Tip Card  1.The goal of habituation training is to assist in decreasing symptoms of vertigo, dizziness, or nausea provoked by specific head and body motions. 2.These exercises may initially increase symptoms; however, be persistent and work through symptoms. With repetition and time, the exercises will assist in reducing or eliminating symptoms. 3.Exercises should be stopped and discussed with the therapist if you experience any of the following: - Sudden change or fluctuation in hearing - New onset of ringing in the ears, or increase in current intensity - Any fluid discharge from the ear - Severe pain in neck or back - Extreme nausea   Habituation - Sit to Side-Lying   Sit on edge of bed. Lie down onto the right side and hold until dizziness stops, plus 20 seconds.  Return to sitting and wait until dizziness stops, plus 20 seconds.  Repeat to the left side. Repeat sequence 5 times per session. Do 2 sessions per day.  Copyright  VHI. All rights reserved.          PT Education - 06/21/17 1707    Education provided Yes   Education Details clinical findings, PT POC and goals of treatment, Nestor Lewandowsky to prepare for BPPV treatment   Person(s)  Educated Patient   Methods Explanation   Comprehension Verbalized understanding          PT Short Term Goals - 06/21/17 2046      PT SHORT TERM GOAL #1   Title Pt will participate in FGA assessment with LTG to be set.   Time 4   Period Weeks   Status New   Target Date 07/21/17     PT SHORT TERM GOAL #2   Title Pt will be independent with habituation  HEP and tolerate VOR in sitting for gaze adaptation   Time 4   Period Weeks   Status New   Target Date 07/21/17     PT SHORT TERM GOAL #3   Title Pt will tolerate canalith repositioning maneuvers for anterior cupulolithiasis   Time 4   Period Weeks   Status New   Target Date 07/21/17     PT SHORT TERM GOAL #4   Title Pt will tolerate initial assessment of DVA   Time 4   Period Weeks   Status New   Target Date 07/21/17           PT Long Term Goals - 06/21/17 2049      PT LONG TERM GOAL #1   Title Pt will be independent with habituation, gaze adaptation and balance HEP in standing   Time 8   Period Weeks   Status New   Target Date 08/20/17     PT LONG TERM GOAL #2   Title Pt will demonstrate improved use of VOR as indicated by DVA of <4 line difference   Baseline TBD   Time 8   Period Weeks   Status New   Target Date 08/20/17     PT LONG TERM GOAL #3   Title Pt will report 0/10 dizziness during positional testing with absent nystagmus to indicate resolution of BPPV   Time 8   Period Weeks   Status New   Target Date 08/20/17     PT LONG TERM GOAL #4   Title Pt will demonstrate decreased falls risk during gait as indicated by FGA score of > or = 23/30   Baseline TBD   Time 8   Period Weeks   Status New   Target Date 08/20/17     PT LONG TERM GOAL #5   Title Pt will report decreased functional limitations due to dizziness as indicated by [facility] score decrease of 18 points   Baseline 32 mild   Time 8   Period Weeks   Status New   Target Date 08/20/17                Plan - 06/21/17 1710      Clinical Impression Statement Pt is a 61 year old female presenting to OPPT neuro for low complexity PT evaluation for recurrent vertigo. Pt's PMH significant for the following: vitamin D deficiency, postural lightheadedness, paroxysmal atrial fibrillation, COPD, and back pain. The following deficits were noted during pt's exam: nystagmus and vertigo with positional testing: downbeating, L rotary nystagmus >1 minute indicating anterior canal cupulolithiasis, impaired VOR, R vestibular hypofunction, motion sensitivity and impaired balance.  Patient's gait speed indicates pt is safe for community ambulation but pt purposely avoids head movements in the pitch plane. Pt's condition is stable and would benefit from skilled PT to address these impairments and functional limitations to maximize functional mobility independence and reduce falls risk.   History and Personal Factors relevant to plan of care: vertigo is chronic in nature 15-16 years, PAF, COPD, back pain, postural lightheadedness, vitamin D deficiency   Clinical Presentation Stable   Clinical Presentation due to: vertigo is chronic in nature 15-16 years, anxiety/fear of vertigo and avoidance of movement    Clinical Decision Making Low   Rehab Potential Good   Clinical Impairments Affecting Rehab Potential 15-16 year h/o chronic vertigo, anxiety of testing/treatment positions, COPD, PAF (afib),    PT Frequency 1x / week   PT Duration 8 weeks  PT Treatment/Interventions ADLs/Self Care Home Management;Canalith Repostioning;Functional mobility training;Therapeutic activities;Therapeutic exercise;Balance training;Neuromuscular re-education;Patient/family education;Vestibular   PT Next Visit Plan provide with handout for brandt-daroff-check technique; assess FGA and set LTG.  Demonstrate treatment method for anterior cupulolithiasis, reassess sidelying test with frenzel's to determine laterality (i believe it is R side)   PT Home Exercise Plan x 1  viewing for adaptation, habituation, brandt-daroff   Consulted and Agree with Plan of Care Patient      Patient will benefit from skilled therapeutic intervention in order to improve the following deficits and impairments:  Decreased balance, Dizziness  Visit Diagnosis: Dizziness and giddiness  BPPV (benign paroxysmal positional vertigo), right  Unsteadiness on feet     Problem List Patient Active Problem List   Diagnosis Date Noted  . COPD (chronic obstructive pulmonary disease) ([place]) 05/18/2017  . Hyperglycemia 05/17/2017  . Centrilobular emphysema ([place]) 12/15/2016  . Left wrist pain 06/15/2014  . Smoker 01/31/2013  . Abnormal liver function tests 01/31/2013  . Meralgia paraesthetica, right 02/29/2012  . PAF (paroxysmal atrial fibrillation) ([place]) 09/04/2011  . Vertigo 09/04/2011  . Preventative health care 08/28/2011  . BACK PAIN 03/18/2010  . Vitamin D deficiency 06/03/2009  . Hyperlipidemia 06/03/2009  . GERD 06/03/2009    Raylene Everts, PT, DPT 06/21/17    8:55 PM    [facility] [address] [place], [place], [address] Phone: [phone]   Fax:  [phone]  Name: FRANCYNE ARREAGA MRN: [id number] Date of Birth: September 16, 1956

## 2017-06-21 NOTE — Patient Instructions (Signed)
Habituation - Tip Card  1.The goal of habituation training is to assist in decreasing symptoms of vertigo, dizziness, or nausea provoked by specific head and body motions. 2.These exercises may initially increase symptoms; however, be persistent and work through symptoms. With repetition and time, the exercises will assist in reducing or eliminating symptoms. 3.Exercises should be stopped and discussed with the therapist if you experience any of the following: - Sudden change or fluctuation in hearing - New onset of ringing in the ears, or increase in current intensity - Any fluid discharge from the ear - Severe pain in neck or back - Extreme nausea  Habituation - Sit to Side-Lying   Sit on edge of bed. Lie down onto the right side and hold until dizziness stops, plus 20 seconds.  Return to sitting and wait until dizziness stops, plus 20 seconds.  Repeat to the left side. Repeat sequence 5 times per session. Do 2 sessions per day.  Copyright  VHI. All rights reserved.     

## 2017-06-29 ENCOUNTER — Encounter: Payer: Self-pay | Admitting: Physical Therapy

## 2017-06-29 NOTE — Therapy (Signed)
[facility] [address] [place], [place], [address] Phone: [phone]   Fax:  [phone]  Patient Details  Name: Brittany Burton MRN: [id number] Date of Birth: August 27, 1956 Referring Provider:  No ref. provider found  Encounter Date: 06/29/2017 PHYSICAL THERAPY DISCHARGE SUMMARY  Visits from Start of Care: 1  Current functional level related to goals / functional outcomes: Unable to assess; pt requested to be D/C after initial evaluation-stated she would not be returning to PT   Remaining deficits: Dizziness and imbalance   Education / Equipment: Habituation HEP  Plan: Patient agrees to discharge.  Patient goals were not met. Patient is being discharged due to the patient's request.  ?????     Raylene Everts, PT, DPT 06/29/17    1:39 PM    [facility] [address] [place], [place], [address] Phone: [phone]   Fax:  [phone]

## 2017-06-30 ENCOUNTER — Ambulatory Visit: Payer: 59 | Admitting: Physical Therapy

## 2017-06-30 ENCOUNTER — Encounter: Payer: 59 | Admitting: Physical Therapy

## 2017-07-13 LAB — HM MAMMOGRAPHY

## 2017-10-09 ENCOUNTER — Encounter: Payer: Self-pay | Admitting: Gastroenterology

## 2017-11-28 ENCOUNTER — Other Ambulatory Visit: Payer: Self-pay

## 2017-11-28 ENCOUNTER — Ambulatory Visit (AMBULATORY_SURGERY_CENTER): Payer: Self-pay

## 2017-11-28 VITALS — Ht 65.0 in | Wt 185.2 lb

## 2017-11-28 DIAGNOSIS — Z1211 Encounter for screening for malignant neoplasm of colon: Secondary | ICD-10-CM

## 2017-11-28 MED ORDER — PEG 3350-KCL-NA BICARB-NACL 420 G PO SOLR: 4000.0000 mL | Freq: Once | ORAL | 0 refills | Status: DC

## 2017-11-28 MED ORDER — NA SULFATE-K SULFATE-MG SULF 17.5-3.13-1.6 GM/177ML PO SOLN: 1.0000 | Freq: Once | ORAL | 0 refills | Status: AC

## 2017-11-28 NOTE — Progress Notes (Signed)
Denies allergies to eggs or soy products. Denies complication of anesthesia or sedation. Denies use of weight loss medication. Denies use of O2.   Emmi instructions declined.   Golytely prep was originally ordered for colonoscopy per Dr. Ardis Hughs orders. In reviewing instructions for Golytely patient states she did not want to drink the volume. Patient insisted that she have a different prep. Suprep was ordered. Patient verbalizes that she understands that Suprep may cost more, but she was ok with cost and wanted to go with Suprep. Suprep was sent to her pharmacy.

## 2017-12-05 ENCOUNTER — Encounter: Payer: Self-pay | Admitting: Gastroenterology

## 2017-12-12 ENCOUNTER — Encounter: Payer: Self-pay | Admitting: Gastroenterology

## 2017-12-12 ENCOUNTER — Other Ambulatory Visit: Payer: Self-pay

## 2017-12-12 ENCOUNTER — Ambulatory Visit (AMBULATORY_SURGERY_CENTER): Payer: 59 | Admitting: Gastroenterology

## 2017-12-12 VITALS — BP 138/80 | HR 74 | Temp 97.1°F | Resp 14 | Ht 65.0 in | Wt 185.0 lb

## 2017-12-12 DIAGNOSIS — K5730 Diverticulosis of large intestine without perforation or abscess without bleeding: Secondary | ICD-10-CM | POA: Diagnosis not present

## 2017-12-12 DIAGNOSIS — Z1212 Encounter for screening for malignant neoplasm of rectum: Secondary | ICD-10-CM | POA: Diagnosis not present

## 2017-12-12 DIAGNOSIS — Z1211 Encounter for screening for malignant neoplasm of colon: Secondary | ICD-10-CM

## 2017-12-12 MED ORDER — SODIUM CHLORIDE 0.9 % IV SOLN: 500.0000 mL | Freq: Once | INTRAVENOUS | Status: DC

## 2017-12-12 NOTE — Op Note (Signed)
[facility] Patient Name: Brittany Burton Procedure Date: 12/12/2017 8:30 AM MRN: [id number] Endoscopist: Milus Banister , MD Age: 62 Referring MD:  Date of Birth: 1956/01/27 Gender: Female Account #: [id number] Procedure:                Colonoscopy Indications:              Screening for colorectal malignant neoplasm Medicines:                Monitored Anesthesia Care Procedure:                Pre-Anesthesia Assessment:                           - Prior to the procedure, a History and Physical                            was performed, and patient medications and                            allergies were reviewed. The patient's tolerance of                            previous anesthesia was also reviewed. The risks                            and benefits of the procedure and the sedation                            options and risks were discussed with the patient.                            All questions were answered, and informed consent                            was obtained. Prior Anticoagulants: The patient has                            taken no previous anticoagulant or antiplatelet                            agents. ASA Grade Assessment: II - A patient with                            mild systemic disease. After reviewing the risks                            and benefits, the patient was deemed in                            satisfactory condition to undergo the procedure.                           After obtaining informed consent, the colonoscope  was passed under direct vision. Throughout the                            procedure, the patient's blood pressure, pulse, and                            oxygen saturations were monitored continuously. The                            Colonoscope was introduced through the anus and                            advanced to the the cecum, identified by                            appendiceal orifice and  ileocecal valve. The                            colonoscopy was performed without difficulty. The                            patient tolerated the procedure well. The quality                            of the bowel preparation was excellent. The                            ileocecal valve, appendiceal orifice, and rectum                            were photographed. Scope In: 8:39:30 AM Scope Out: 8:49:38 AM Scope Withdrawal Time: 0 hours 6 minutes 35 seconds  Total Procedure Duration: 0 hours 10 minutes 8 seconds  Findings:                 Multiple small-mouthed diverticula were found in                            the left colon.                           The exam was otherwise without abnormality on                            direct and retroflexion views. Complications:            No immediate complications. Estimated blood loss:                            None. Estimated Blood Loss:     Estimated blood loss: none. Impression:               - Diverticulosis in the left colon.                           - The examination was otherwise normal on direct  and retroflexion views.                           - No polyps or cancers. Recommendation:           - Patient has a contact number available for                            emergencies. The signs and symptoms of potential                            delayed complications were discussed with the                            patient. Return to normal activities tomorrow.                            Written discharge instructions were provided to the                            patient.                           - Resume previous diet.                           - Continue present medications.                           - Repeat colonoscopy in 10 years for screening. Milus Banister, MD 12/12/2017 8:51:39 AM This report has been signed electronically.

## 2017-12-12 NOTE — Progress Notes (Signed)
Report to PACU, RN, vss, BBS= Clear.  

## 2017-12-12 NOTE — Patient Instructions (Signed)
INFORMATION GIVEN ON DIVERTICULOSIS  YOU HAD AN ENDOSCOPIC PROCEDURE TODAY AT [facility]:   Refer to the procedure report that was given to you for any specific questions about what was found during the examination.  If the procedure report does not answer your questions, please call your gastroenterologist to clarify.  If you requested that your care partner not be given the details of your procedure findings, then the procedure report has been included in a sealed envelope for you to review at your convenience later.  YOU SHOULD EXPECT: Some feelings of bloating in the abdomen. Passage of more gas than usual.  Walking can help get rid of the air that was put into your GI tract during the procedure and reduce the bloating. If you had a lower endoscopy (such as a colonoscopy or flexible sigmoidoscopy) you may notice spotting of blood in your stool or on the toilet paper. If you underwent a bowel prep for your procedure, you may not have a normal bowel movement for a few days.  Please Note:  You might notice some irritation and congestion in your nose or some drainage.  This is from the oxygen used during your procedure.  There is no need for concern and it should clear up in a day or so.  SYMPTOMS TO REPORT IMMEDIATELY:   Following lower endoscopy (colonoscopy or flexible sigmoidoscopy):  Excessive amounts of blood in the stool  Significant tenderness or worsening of abdominal pains  Swelling of the abdomen that is new, acute  Fever of 100F or higher For urgent or emergent issues, a gastroenterologist can be reached at any hour by calling [phone].   DIET:  We do recommend a small meal at first, but then you may proceed to your regular diet.  Drink plenty of fluids but you should avoid alcoholic beverages for 24 hours.  ACTIVITY:  You should plan to take it easy for the rest of today and you should NOT DRIVE or use heavy machinery until tomorrow (because of the sedation  medicines used during the test).    FOLLOW UP: Our staff will call the number listed on your records the next business day following your procedure to check on you and address any questions or concerns that you may have regarding the information given to you following your procedure. If we do not reach you, we will leave a message.  However, if you are feeling well and you are not experiencing any problems, there is no need to return our call.  We will assume that you have returned to your regular daily activities without incident.  If any biopsies were taken you will be contacted by phone or by letter within the next 1-3 weeks.  Please call us at [phone] if you have not heard about the biopsies in 3 weeks.    SIGNATURES/CONFIDENTIALITY: You and/or your care partner have signed paperwork which will be entered into your electronic medical record.  These signatures attest to the fact that that the information above on your After Visit Summary has been reviewed and is understood.  Full responsibility of the confidentiality of this discharge information lies with you and/or your care-partner.

## 2017-12-12 NOTE — Progress Notes (Signed)
Pt's states no medical or surgical changes since previsit or office visit. 

## 2017-12-13 ENCOUNTER — Telehealth: Payer: Self-pay | Admitting: *Deleted

## 2017-12-13 NOTE — Telephone Encounter (Signed)
  Follow up Call-  Call back number 12/12/2017  Post procedure Call Back phone  # [phone]  Permission to leave phone message Yes  Some recent data might be hidden     Patient questions:  Do you have a fever, pain , or abdominal swelling? No. Pain Score  0 *  Have you tolerated food without any problems? Yes.    Have you been able to return to your normal activities? Yes.    Do you have any questions about your discharge instructions: Diet   No. Medications  No. Follow up visit  No.  Do you have questions or concerns about your Care? No.  Actions: * If pain score is 4 or above: No action needed, pain <4.

## 2017-12-13 NOTE — Telephone Encounter (Signed)
Message left

## 2018-06-21 ENCOUNTER — Encounter: Payer: Self-pay | Admitting: Gynecology

## 2018-06-21 ENCOUNTER — Ambulatory Visit (INDEPENDENT_AMBULATORY_CARE_PROVIDER_SITE_OTHER): Payer: BLUE CROSS/BLUE SHIELD | Admitting: Gynecology

## 2018-06-21 VITALS — BP 120/82 | Ht 65.0 in | Wt 183.0 lb

## 2018-06-21 DIAGNOSIS — N952 Postmenopausal atrophic vaginitis: Secondary | ICD-10-CM

## 2018-06-21 DIAGNOSIS — Z01419 Encounter for gynecological examination (general) (routine) without abnormal findings: Secondary | ICD-10-CM

## 2018-06-21 DIAGNOSIS — M8580 Other specified disorders of bone density and structure, unspecified site: Secondary | ICD-10-CM

## 2018-06-21 HISTORY — DX: Other specified disorders of bone density and structure, unspecified site: M85.80

## 2018-06-21 NOTE — Progress Notes (Signed)
    Brittany Burton 07-May-1956 [id number]        62 y.o.  G0P0000 for annual gynecologic exam.  Without gynecologic complaints.  Past medical history,surgical history, problem list, medications, allergies, family history and social history were all reviewed and documented as reviewed in the EPIC chart.  ROS:  Performed with pertinent positives and negatives included in the history, assessment and plan.   Additional significant findings : None   Exam: Wandra Scot assistant Vitals:   06/21/18 0855  BP: 120/82  Weight: 183 lb (83 kg)  Height: 5\' 5"  (1.651 m)   Body mass index is 30.45 kg/m.  General appearance:  Normal affect, orientation and appearance. Skin: Grossly normal HEENT: Without gross lesions.  No cervical or supraclavicular adenopathy. Thyroid normal.  Lungs:  Clear without wheezing, rales or rhonchi Cardiac: RR, without RMG Abdominal:  Soft, nontender, without masses, guarding, rebound, organomegaly or hernia Breasts:  Examined lying and sitting without masses, retractions, discharge or axillary adenopathy. Pelvic:  Ext, BUS, Vagina: Normal with atrophic changes  Cervix: With atrophic changes  Uterus: Anteverted, normal size, shape and contour, midline and mobile nontender   Adnexa: Without masses or tenderness    Anus and perineum: Normal   Rectovaginal: Normal sphincter tone without palpated masses or tenderness.    Assessment/Plan:  62 y.o. G0P0000 female for annual gynecologic exam.   1. Postmenopausal.  No significant menopausal symptoms or any vaginal bleeding. 2. Pap smear/HPV 04/2014.  Pap smear/HPV today.  No history of abnormal Pap smears.  Continue Pap smear/HPV screening every 5 years per current screening guidelines. 3. Mammography coming due in August and she has scheduled.  Breast exam normal today. 4. Colonoscopy 2019.  Repeat at their recommended interval. 5. DEXA 2011 normal.  Was to do DEXA last year but never happened.  Schedule DEXA now and  patient agrees to follow-up for this. 6. Health maintenance.  No routine lab work done as patient does this elsewhere.  Follow-up 1 year, sooner as needed.   Anastasio Auerbach MD, 9:43 AM 06/21/2018

## 2018-06-21 NOTE — Patient Instructions (Addendum)
Followup for bone density as scheduled. 

## 2018-06-22 LAB — PAP IG W/ RFLX HPV ASCU

## 2018-07-03 DIAGNOSIS — H04123 Dry eye syndrome of bilateral lacrimal glands: Secondary | ICD-10-CM | POA: Diagnosis not present

## 2018-07-10 ENCOUNTER — Ambulatory Visit (INDEPENDENT_AMBULATORY_CARE_PROVIDER_SITE_OTHER): Payer: BLUE CROSS/BLUE SHIELD

## 2018-07-10 DIAGNOSIS — Z13820 Encounter for screening for osteoporosis: Secondary | ICD-10-CM

## 2018-07-10 DIAGNOSIS — Z01419 Encounter for gynecological examination (general) (routine) without abnormal findings: Secondary | ICD-10-CM

## 2018-07-10 DIAGNOSIS — M8589 Other specified disorders of bone density and structure, multiple sites: Secondary | ICD-10-CM | POA: Diagnosis not present

## 2018-07-12 ENCOUNTER — Encounter: Payer: Self-pay | Admitting: Gynecology

## 2018-07-12 ENCOUNTER — Other Ambulatory Visit: Payer: Self-pay | Admitting: Gynecology

## 2018-07-12 DIAGNOSIS — M8589 Other specified disorders of bone density and structure, multiple sites: Secondary | ICD-10-CM

## 2018-07-12 DIAGNOSIS — Z13820 Encounter for screening for osteoporosis: Secondary | ICD-10-CM

## 2018-07-24 DIAGNOSIS — Z1231 Encounter for screening mammogram for malignant neoplasm of breast: Secondary | ICD-10-CM | POA: Diagnosis not present

## 2018-07-24 LAB — HM MAMMOGRAPHY

## 2018-12-24 ENCOUNTER — Encounter: Payer: Self-pay | Admitting: Internal Medicine

## 2018-12-24 DIAGNOSIS — Z0000 Encounter for general adult medical examination without abnormal findings: Secondary | ICD-10-CM

## 2019-01-02 ENCOUNTER — Other Ambulatory Visit (INDEPENDENT_AMBULATORY_CARE_PROVIDER_SITE_OTHER): Payer: BLUE CROSS/BLUE SHIELD

## 2019-01-02 DIAGNOSIS — Z0000 Encounter for general adult medical examination without abnormal findings: Secondary | ICD-10-CM

## 2019-01-02 LAB — LIPID PANEL
CHOL/HDL RATIO: 3
Cholesterol: 156 mg/dL (ref 0–200)
HDL: 48.1 mg/dL (ref >39.00)
LDL Cholesterol: 91 mg/dL (ref 0–99)
NonHDL: 108
TRIGLYCERIDES: 86 mg/dL (ref 0.0–149.0)
VLDL: 17.2 mg/dL (ref 0.0–40.0)

## 2019-01-02 LAB — BASIC METABOLIC PANEL
BUN: 19 mg/dL (ref 6–23)
CO2: 19 meq/L (ref 19–32)
CREATININE: 0.76 mg/dL (ref 0.40–1.20)
Calcium: 9.3 mg/dL (ref 8.4–10.5)
Chloride: 104 mEq/L (ref 96–112)
GFR: 76.98 mL/min (ref >60.00)
Glucose, Bld: 97 mg/dL (ref 70–99)
Potassium: 5.1 mEq/L (ref 3.5–5.1)
Sodium: 138 mEq/L (ref 135–145)

## 2019-01-02 LAB — URINALYSIS, ROUTINE W REFLEX MICROSCOPIC
BILIRUBIN URINE: NEGATIVE
Hgb urine dipstick: NEGATIVE
Ketones, ur: NEGATIVE
LEUKOCYTE UA: NEGATIVE
Nitrite: NEGATIVE
PH: 5.5 (ref 5.0–8.0)
Specific Gravity, Urine: 1.02 (ref 1.000–1.030)
Total Protein, Urine: NEGATIVE
Urine Glucose: NEGATIVE
Urobilinogen, UA: 0.2 (ref 0.0–1.0)

## 2019-01-02 LAB — CBC WITH DIFFERENTIAL/PLATELET
Basophils Absolute: 0.1 10*3/uL (ref 0.0–0.1)
Basophils Relative: 0.8 % (ref 0.0–3.0)
Eosinophils Absolute: 0.1 10*3/uL (ref 0.0–0.7)
Eosinophils Relative: 2.3 % (ref 0.0–5.0)
HCT: 43.9 % (ref 36.0–46.0)
Hemoglobin: 14.8 g/dL (ref 12.0–15.0)
Lymphocytes Relative: 31.9 % (ref 12.0–46.0)
Lymphs Abs: 2.1 10*3/uL (ref 0.7–4.0)
MCHC: 33.8 g/dL (ref 30.0–36.0)
MCV: 95.7 fl (ref 78.0–100.0)
Monocytes Absolute: 0.6 10*3/uL (ref 0.1–1.0)
Monocytes Relative: 8.6 % (ref 3.0–12.0)
NEUTROS PCT: 56.4 % (ref 43.0–77.0)
Neutro Abs: 3.7 10*3/uL (ref 1.4–7.7)
Platelets: 106 10*3/uL — ABNORMAL LOW (ref 150.0–400.0)
RBC: 4.59 Mil/uL (ref 3.87–5.11)
RDW: 12.7 % (ref 11.5–15.5)
WBC: 6.5 10*3/uL (ref 4.0–10.5)

## 2019-01-02 LAB — HEPATIC FUNCTION PANEL
ALK PHOS: 88 U/L (ref 39–117)
ALT: 28 U/L (ref 0–35)
AST: 30 U/L (ref 0–37)
Albumin: 4 g/dL (ref 3.5–5.2)
Bilirubin, Direct: 0.2 mg/dL (ref 0.0–0.3)
Total Bilirubin: 0.7 mg/dL (ref 0.2–1.2)
Total Protein: 6.8 g/dL (ref 6.0–8.3)

## 2019-01-02 LAB — TSH: TSH: 4.24 u[IU]/mL (ref 0.35–4.50)

## 2019-01-07 ENCOUNTER — Ambulatory Visit (INDEPENDENT_AMBULATORY_CARE_PROVIDER_SITE_OTHER): Payer: BLUE CROSS/BLUE SHIELD | Admitting: Internal Medicine

## 2019-01-07 ENCOUNTER — Encounter: Payer: Self-pay | Admitting: Internal Medicine

## 2019-01-07 VITALS — BP 124/82 | HR 81 | Temp 97.8°F | Ht 65.0 in | Wt 189.0 lb

## 2019-01-07 DIAGNOSIS — F17200 Nicotine dependence, unspecified, uncomplicated: Secondary | ICD-10-CM | POA: Diagnosis not present

## 2019-01-07 DIAGNOSIS — R739 Hyperglycemia, unspecified: Secondary | ICD-10-CM

## 2019-01-07 DIAGNOSIS — Z114 Encounter for screening for human immunodeficiency virus [HIV]: Secondary | ICD-10-CM

## 2019-01-07 DIAGNOSIS — Z23 Encounter for immunization: Secondary | ICD-10-CM | POA: Diagnosis not present

## 2019-01-07 DIAGNOSIS — D696 Thrombocytopenia, unspecified: Secondary | ICD-10-CM | POA: Diagnosis not present

## 2019-01-07 DIAGNOSIS — Z0000 Encounter for general adult medical examination without abnormal findings: Secondary | ICD-10-CM | POA: Diagnosis not present

## 2019-01-07 MED ORDER — ZOSTER VAC RECOMB ADJUVANTED 50 MCG/0.5ML IM SUSR: 0.5000 mL | Freq: Once | INTRAMUSCULAR | 1 refills | Status: AC

## 2019-01-07 MED ORDER — MECLIZINE HCL 25 MG PO TABS: 25.0000 mg | ORAL_TABLET | Freq: Every day | ORAL | 3 refills | Status: DC | PRN

## 2019-01-07 MED ORDER — ROSUVASTATIN CALCIUM 20 MG PO TABS: 20.0000 mg | ORAL_TABLET | Freq: Every day | ORAL | 3 refills | Status: DC

## 2019-01-07 NOTE — Assessment & Plan Note (Signed)

## 2019-01-07 NOTE — Progress Notes (Signed)
Subjective:    Patient ID: Brittany Burton, female    DOB: 08/02/1956, 63 y.o.   MRN: [id number]  HPI  Here for wellness and f/u;  Overall doing ok;  Pt denies Chest pain, worsening SOB, DOE, wheezing, orthopnea, PND, worsening LE edema, palpitations, dizziness or syncope.  Pt denies neurological change such as new headache, facial or extremity weakness.  Pt denies polydipsia, polyuria, or low sugar symptoms. Pt states overall good compliance with treatment and medications, good tolerability, and has been trying to follow appropriate diet.  Pt denies worsening depressive symptoms, suicidal ideation or panic. No fever, night sweats, wt loss, loss of appetite, or other constitutional symptoms.  Pt states good ability with ADL's, has low fall risk, home safety reviewed and adequate, no other significant changes in hearing or vision, and only occasionally active with exercise. Now Completely retired since last June.  Tolerating crestor well.  No overt bleeding or bruising.  Has not had the LDCT for lung cancer screening in 2 yrs Past Medical History:  Diagnosis Date  . BACK PAIN 03/18/2010  . COPD (chronic obstructive pulmonary disease) ([facility]) 05/18/2017  . GERD 06/03/2009  . HYPERLIPIDEMIA 06/03/2009  . LEG PAIN, RIGHT 03/18/2010  . Osteopenia 06/2018   T score -1.8 FRAX 9.4% / 1.8%  . PAF (paroxysmal atrial fibrillation) ([facility]) 09/04/2011  . POSTURAL LIGHTHEADEDNESS 06/17/2010  . Vertigo    chronic recurrent, BPV, probl left related  . VITAMIN D DEFICIENCY 06/03/2009   No past surgical history on file.  reports that she has been smoking cigarettes. She has a 30.00 pack-year smoking history. She has never used smokeless tobacco. She reports that she does not drink alcohol or use drugs. family history includes Cancer in her paternal grandmother; Colon cancer in her paternal grandmother; Esophageal cancer in her father; Melanoma in her father and sister. No Known Allergies Current Outpatient  Medications on File Prior to Visit  Medication Sig Dispense Refill  . aspirin 81 MG EC tablet Take 1 tablet (81 mg total) by mouth daily. Swallow whole. 30 tablet 12  . Calcium Carbonate-Vitamin D (CALCIUM + D PO) Take by mouth.     Current Facility-Administered Medications on File Prior to Visit  Medication Dose Route Frequency Provider Last Rate Last Dose  . 0.9 %  sodium chloride infusion  500 mL Intravenous Once Milus Banister, MD       Review of Systems Constitutional: Negative for other unusual diaphoresis, sweats, appetite or weight changes HENT: Negative for other worsening hearing loss, ear pain, facial swelling, mouth sores or neck stiffness.   Eyes: Negative for other worsening pain, redness or other visual disturbance.  Respiratory: Negative for other stridor or swelling Cardiovascular: Negative for other palpitations or other chest pain  Gastrointestinal: Negative for worsening diarrhea or loose stools, blood in stool, distention or other pain Genitourinary: Negative for hematuria, flank pain or other change in urine volume.  Musculoskeletal: Negative for myalgias or other joint swelling.  Skin: Negative for other color change, or other wound or worsening drainage.  Neurological: Negative for other syncope or numbness. Hematological: Negative for other adenopathy or swelling Psychiatric/Behavioral: Negative for hallucinations, other worsening agitation, SI, self-injury, or new decreased concentration All other system neg per pt    Objective:   Physical Exam BP 124/82   Pulse 81   Temp 97.8 F (36.6 C) (Oral)   Ht 5\' 5"  (1.651 m)   Wt 189 lb (85.7 kg)   SpO2 93%   BMI  31.45 kg/m  VS noted,  Constitutional: Pt is oriented to person, place, and time. Appears well-developed and well-nourished, in no significant distress and comfortable Head: Normocephalic and atraumatic  Eyes: Conjunctivae and EOM are normal. Pupils are equal, round, and reactive to light Right Ear:  External ear normal without discharge Left Ear: External ear normal without discharge Nose: Nose without discharge or deformity Mouth/Throat: Oropharynx is without other ulcerations and moist  Neck: Normal range of motion. Neck supple. No JVD present. No tracheal deviation present or significant neck LA or mass Cardiovascular: Normal rate, regular rhythm, normal heart sounds and intact distal pulses.   Pulmonary/Chest: WOB normal and breath sounds without rales or wheezing  Abdominal: Soft. Bowel sounds are normal. NT. No HSM  Musculoskeletal: Normal range of motion. Exhibits no edema Lymphadenopathy: Has no other cervical adenopathy.  Neurological: Pt is alert and oriented to person, place, and time. Pt has normal reflexes. No cranial nerve deficit. Motor grossly intact, Gait intact Skin: Skin is warm and dry. No rash noted or new ulcerations Psychiatric:  Has normal mood and affect. Behavior is normal without agitation No other exam findings Lab Results  Component Value Date   WBC 6.5 01/02/2019   HGB 14.8 01/02/2019   HCT 43.9 01/02/2019   PLT 106.0 (L) 01/02/2019   GLUCOSE 97 01/02/2019   CHOL 156 01/02/2019   TRIG 86.0 01/02/2019   HDL 48.10 01/02/2019   LDLDIRECT 145.2 01/28/2013   LDLCALC 91 01/02/2019   ALT 28 01/02/2019   AST 30 01/02/2019   NA 138 01/02/2019   K 5.1 01/02/2019   CL 104 01/02/2019   CREATININE 0.76 01/02/2019   BUN 19 01/02/2019   CO2 19 01/02/2019   TSH 4.24 01/02/2019   HGBA1C 5.8 05/17/2017       Assessment & Plan:

## 2019-01-07 NOTE — Patient Instructions (Addendum)
You had the Tdap tetanus shot today  Your shingles shot prescription was sent to the pharmacy  Please return at your convenience sometime in the next week to repeat the blood counts including the platelets; if getting lower, you may wish to be referred to hematology  You will be contacted regarding the referral for: Pulmonary for the LDCT program for lung cancer screening  Please continue all other medications as before, and refills have been done if requested.  Please have the pharmacy call with any other refills you may need.  Please continue your efforts at being more active, low cholesterol diet, and weight control.  You are otherwise up to date with prevention measures today.  Please keep your appointments with your specialists as you may have planned  Please return in 1 year for your yearly visit, or sooner if needed, with Lab testing done 3-5 days before

## 2019-01-07 NOTE — Assessment & Plan Note (Signed)
stable overall by history and exam, recent data reviewed with pt, and pt to continue medical treatment as before,  to f/u any worsening symptoms or concerns  

## 2019-01-07 NOTE — Assessment & Plan Note (Signed)
Mild but new onset, for f/u cbc with plt

## 2019-01-07 NOTE — Assessment & Plan Note (Signed)
Urged to quit and has cut down considerably, for pulm referral to resume LDCT lung cancer screening

## 2019-01-14 ENCOUNTER — Other Ambulatory Visit: Payer: Self-pay | Admitting: Internal Medicine

## 2019-01-14 DIAGNOSIS — Z122 Encounter for screening for malignant neoplasm of respiratory organs: Secondary | ICD-10-CM

## 2019-01-14 NOTE — Telephone Encounter (Signed)
Ok, this is done 

## 2019-01-21 ENCOUNTER — Other Ambulatory Visit: Payer: Self-pay | Admitting: Acute Care

## 2019-01-21 DIAGNOSIS — F17210 Nicotine dependence, cigarettes, uncomplicated: Secondary | ICD-10-CM

## 2019-01-21 DIAGNOSIS — Z122 Encounter for screening for malignant neoplasm of respiratory organs: Secondary | ICD-10-CM

## 2019-02-18 ENCOUNTER — Encounter: Payer: BLUE CROSS/BLUE SHIELD | Admitting: Acute Care

## 2019-02-18 ENCOUNTER — Ambulatory Visit: Payer: BLUE CROSS/BLUE SHIELD

## 2019-04-19 DIAGNOSIS — Z808 Family history of malignant neoplasm of other organs or systems: Secondary | ICD-10-CM | POA: Diagnosis not present

## 2019-04-19 DIAGNOSIS — D225 Melanocytic nevi of trunk: Secondary | ICD-10-CM | POA: Diagnosis not present

## 2019-04-19 DIAGNOSIS — L570 Actinic keratosis: Secondary | ICD-10-CM | POA: Diagnosis not present

## 2019-04-19 DIAGNOSIS — Z85828 Personal history of other malignant neoplasm of skin: Secondary | ICD-10-CM | POA: Diagnosis not present

## 2019-04-19 DIAGNOSIS — L821 Other seborrheic keratosis: Secondary | ICD-10-CM | POA: Diagnosis not present

## 2019-05-08 ENCOUNTER — Ambulatory Visit (INDEPENDENT_AMBULATORY_CARE_PROVIDER_SITE_OTHER): Payer: BC Managed Care – PPO | Admitting: Acute Care

## 2019-05-08 ENCOUNTER — Other Ambulatory Visit: Payer: Self-pay

## 2019-05-08 ENCOUNTER — Encounter: Payer: Self-pay | Admitting: Acute Care

## 2019-05-08 ENCOUNTER — Ambulatory Visit
Admission: RE | Admit: 2019-05-08 | Discharge: 2019-05-08 | Disposition: A | Discharge status: Home / Self Care | Payer: BLUE CROSS/BLUE SHIELD | Source: Ambulatory Visit | Attending: Acute Care | Admitting: Acute Care

## 2019-05-08 ENCOUNTER — Encounter: Payer: BLUE CROSS/BLUE SHIELD | Admitting: Acute Care

## 2019-05-08 VITALS — BP 118/74 | HR 67 | Temp 98.1°F | Ht 65.0 in | Wt 185.0 lb

## 2019-05-08 DIAGNOSIS — F17210 Nicotine dependence, cigarettes, uncomplicated: Secondary | ICD-10-CM

## 2019-05-08 DIAGNOSIS — Z122 Encounter for screening for malignant neoplasm of respiratory organs: Secondary | ICD-10-CM

## 2019-05-08 NOTE — Progress Notes (Signed)
Shared Decision Making Visit Lung Cancer Screening Program [phone])   Eligibility:  Age 63 y.o.  Pack Years Smoking History Calculation 75 pack year smoking history (# packs/per year x # years smoked)  Recent History of coughing up blood  no  Unexplained weight loss? no ( >Than 15 pounds within the last 6 months )  Prior History Lung / other cancer no (Diagnosis within the last 5 years already requiring surveillance chest CT Scans).  Smoking Status Current Smoker  Former Smokers: Years since quit: NA  Quit Date: NA  Visit Components:  Discussion included one or more decision making aids. yes  Discussion included risk/benefits of screening. yes  Discussion included potential follow up diagnostic testing for abnormal scans. yes  Discussion included meaning and risk of over diagnosis. yes  Discussion included meaning and risk of False Positives. yes  Discussion included meaning of total radiation exposure. yes  Counseling Included:  Importance of adherence to annual lung cancer LDCT screening. yes  Impact of comorbidities on ability to participate in the program. yes  Ability and willingness to under diagnostic treatment. yes  Smoking Cessation Counseling:  Current Smokers:   Discussed importance of smoking cessation. yes  Information about tobacco cessation classes and interventions provided to patient. yes  Patient provided with "ticket" for LDCT Scan. yes  Symptomatic Patient. no  Counseling  Diagnosis Code: Tobacco Use Z72.0  Asymptomatic Patient yes  Counseling (Intermediate counseling: > three minutes counseling) F7902  Former Smokers:   Discussed the importance of maintaining cigarette abstinence. yes  Diagnosis Code: Personal History of Nicotine Dependence. I09.735  Information about tobacco cessation classes and interventions provided to patient. Yes  Patient provided with "ticket" for LDCT Scan. yes  Written Order for Lung Cancer  Screening with LDCT placed in Epic. Yes (CT Chest Lung Cancer Screening Low Dose W/O CM) [id number] Z12.2-Screening of respiratory organs Z87.891-Personal history of nicotine dependence  BP 118/74 (BP Location: Left Arm, Cuff Size: Normal)   Pulse 67   Temp 98.1 F (36.7 C) (Oral)   Ht 5\' 5"  (1.651 m)   Wt 185 lb (83.9 kg)   SpO2 100%   BMI 30.79 kg/m    I have spent 25 minutes of face to face time with Ms. Bodnar discussing the risks and benefits of lung cancer screening. We viewed a power point together that explained in detail the above noted topics. We paused at intervals to allow for questions to be asked and answered to ensure understanding.We discussed that the single most powerful action that she can take to decrease her risk of developing lung cancer is to quit smoking. We discussed whether or not she is ready to commit to setting a quit date. We discussed options for tools to aid in quitting smoking including nicotine replacement therapy, non-nicotine medications, support groups, Quit Smart classes, and behavior modification. We discussed that often times setting smaller, more achievable goals, such as eliminating 1 cigarette a day for a week and then 2 cigarettes a day for a week can be helpful in slowly decreasing the number of cigarettes smoked. This allows for a sense of accomplishment as well as providing a clinical benefit. I gave her the " Be Stronger Than Your Excuses" card with contact information for community resources, classes, free nicotine replacement therapy, and access to mobile apps, text messaging, and on-line smoking cessation help. I have also given her my card and contact information in the event she needs to contact me. We discussed the time and  location of the scan, and that either Doroteo Glassman RN or I will call with the results within 24-48 hours of receiving them. I have offered her  a copy of the power point we viewed  as a resource in the event they need  reinforcement of the concepts we discussed today in the office. The patient verbalized understanding of all of  the above and had no further questions upon leaving the office. They have my contact information in the event they have any further questions.  I spent 4 minutes counseling on smoking cessation and the health risks of continued tobacco abuse.  I explained to the patient that there has been a high incidence of coronary artery disease noted on these exams. I explained that this is a non-gated exam therefore degree or severity cannot be determined. This patient is on statin therapy. I have asked the patient to follow-up with their PCP regarding any incidental finding of coronary artery disease and management with diet or medication as their PCP  feels is clinically indicated. The patient verbalized understanding of the above and had no further questions upon completion of the visit.      Magdalen Spatz, NP 05/08/2019 3:20 PM

## 2019-05-27 ENCOUNTER — Other Ambulatory Visit: Payer: Self-pay | Admitting: *Deleted

## 2019-05-27 ENCOUNTER — Encounter: Payer: Self-pay | Admitting: *Deleted

## 2019-05-27 DIAGNOSIS — Z122 Encounter for screening for malignant neoplasm of respiratory organs: Secondary | ICD-10-CM

## 2019-05-27 DIAGNOSIS — F17210 Nicotine dependence, cigarettes, uncomplicated: Secondary | ICD-10-CM

## 2019-06-27 ENCOUNTER — Encounter: Payer: BLUE CROSS/BLUE SHIELD | Admitting: Gynecology

## 2019-07-03 ENCOUNTER — Other Ambulatory Visit: Payer: Self-pay

## 2019-07-04 ENCOUNTER — Ambulatory Visit (INDEPENDENT_AMBULATORY_CARE_PROVIDER_SITE_OTHER): Payer: BC Managed Care – PPO | Admitting: Gynecology

## 2019-07-04 ENCOUNTER — Encounter: Payer: Self-pay | Admitting: Gynecology

## 2019-07-04 VITALS — BP 124/80 | Ht 65.0 in | Wt 182.0 lb

## 2019-07-04 DIAGNOSIS — N952 Postmenopausal atrophic vaginitis: Secondary | ICD-10-CM

## 2019-07-04 DIAGNOSIS — M8580 Other specified disorders of bone density and structure, unspecified site: Secondary | ICD-10-CM

## 2019-07-04 DIAGNOSIS — Z01419 Encounter for gynecological examination (general) (routine) without abnormal findings: Secondary | ICD-10-CM

## 2019-07-04 NOTE — Patient Instructions (Signed)
Office will follow-up with you with your vitamin D results.  Follow-up in 1 year for annual exam

## 2019-07-04 NOTE — Progress Notes (Signed)
    Brittany Burton January 01, 1956 [id number]        63 y.o.  G0P0000 for annual gynecologic exam.  Without gynecologic complaints  Past medical history,surgical history, problem list, medications, allergies, family history and social history were all reviewed and documented as reviewed in the EPIC chart.  ROS:  Performed with pertinent positives and negatives included in the history, assessment and plan.   Additional significant findings : None   Exam: Caryn Bee assistant Vitals:   07/04/19 0853  BP: 124/80  Weight: 182 lb (82.6 kg)  Height: 5\' 5"  (1.651 m)   Body mass index is 30.29 kg/m.  General appearance:  Normal affect, orientation and appearance. Skin: Grossly normal HEENT: Without gross lesions.  No cervical or supraclavicular adenopathy. Thyroid normal.  Lungs:  Clear without wheezing, rales or rhonchi Cardiac: RR, without RMG Abdominal:  Soft, nontender, without masses, guarding, rebound, organomegaly or hernia Breasts:  Examined lying and sitting without masses, retractions, discharge or axillary adenopathy. Pelvic:  Ext, BUS, Vagina: With atrophic changes  Cervix: With atrophic changes  Uterus: Anteverted, normal size, shape and contour, midline and mobile nontender   Adnexa: Without masses or tenderness    Anus and perineum: Normal   Rectovaginal: Normal sphincter tone without palpated masses or tenderness.    Assessment/Plan:  63 y.o. G0P0000 female for annual gynecologic exam.   1. Postmenopausal.  No significant menopausal symptoms or any vaginal bleeding. 2. Pap smear 2019.  No Pap smear done today.  No history of abnormal Pap smears.  Plan repeat Pap smear at 3-year interval per current screening guidelines. 3. Osteopenia.  DEXA 06/2018 T score -1.8 FRAX 9.4% / 1.8%.  History of vitamin D deficiency in the past.  Check vitamin D level today.  Repeat DEXA next year at 2-year interval. 4. Mammography coming due in September and I reminded her to schedule  this.  Breast exam normal today. 5. Colonoscopy 2019.  Repeat at their recommended interval. 6. Health maintenance.  No routine lab work done as patient does this elsewhere.  Follow-up 1 year, sooner as needed.   Anastasio Auerbach MD, 9:41 AM 07/04/2019

## 2019-07-05 ENCOUNTER — Encounter: Payer: Self-pay | Admitting: Gynecology

## 2019-07-05 LAB — VITAMIN D 25 HYDROXY (VIT D DEFICIENCY, FRACTURES): Vit D, 25-Hydroxy: 36 ng/mL (ref 30–100)

## 2019-07-22 ENCOUNTER — Telehealth: Payer: Self-pay | Admitting: Internal Medicine

## 2019-07-22 NOTE — Telephone Encounter (Signed)
LVM notifying patient she is not due for appt

## 2019-07-22 NOTE — Telephone Encounter (Signed)
Pt called in to schedule her flu shot. Pt would also like to have pneumonia shot as well. Please assist, is pt due for pneumonia shot as well?   Please assist/advise

## 2019-07-22 NOTE — Telephone Encounter (Signed)
I do not see where a pneumonia shot is needed in health maintenance.

## 2019-07-30 ENCOUNTER — Encounter: Payer: Self-pay | Admitting: Gynecology

## 2019-07-30 DIAGNOSIS — Z1231 Encounter for screening mammogram for malignant neoplasm of breast: Secondary | ICD-10-CM | POA: Diagnosis not present

## 2019-07-30 DIAGNOSIS — Z803 Family history of malignant neoplasm of breast: Secondary | ICD-10-CM | POA: Diagnosis not present

## 2019-08-02 ENCOUNTER — Other Ambulatory Visit: Payer: Self-pay

## 2019-08-02 ENCOUNTER — Ambulatory Visit (INDEPENDENT_AMBULATORY_CARE_PROVIDER_SITE_OTHER): Payer: BC Managed Care – PPO

## 2019-08-02 DIAGNOSIS — Z23 Encounter for immunization: Secondary | ICD-10-CM | POA: Diagnosis not present

## 2019-08-20 ENCOUNTER — Encounter: Payer: Self-pay | Admitting: Internal Medicine

## 2019-08-20 ENCOUNTER — Encounter: Payer: Self-pay | Admitting: Gynecology

## 2019-08-20 MED ORDER — ZOSTER VAC RECOMB ADJUVANTED 50 MCG/0.5ML IM SUSR: 0.5000 mL | Freq: Once | INTRAMUSCULAR | 1 refills | Status: AC

## 2019-12-12 DIAGNOSIS — H2513 Age-related nuclear cataract, bilateral: Secondary | ICD-10-CM | POA: Diagnosis not present

## 2020-01-01 ENCOUNTER — Encounter: Payer: Self-pay | Admitting: Internal Medicine

## 2020-01-03 ENCOUNTER — Other Ambulatory Visit (INDEPENDENT_AMBULATORY_CARE_PROVIDER_SITE_OTHER): Payer: BC Managed Care – PPO

## 2020-01-03 DIAGNOSIS — Z114 Encounter for screening for human immunodeficiency virus [HIV]: Secondary | ICD-10-CM

## 2020-01-03 DIAGNOSIS — R739 Hyperglycemia, unspecified: Secondary | ICD-10-CM | POA: Diagnosis not present

## 2020-01-03 DIAGNOSIS — Z0000 Encounter for general adult medical examination without abnormal findings: Secondary | ICD-10-CM

## 2020-01-03 LAB — LIPID PANEL
Cholesterol: 163 mg/dL (ref 0–200)
HDL: 53.4 mg/dL (ref >39.00)
LDL Cholesterol: 90 mg/dL (ref 0–99)
NonHDL: 110.04
Total CHOL/HDL Ratio: 3
Triglycerides: 98 mg/dL (ref 0.0–149.0)
VLDL: 19.6 mg/dL (ref 0.0–40.0)

## 2020-01-03 LAB — BASIC METABOLIC PANEL
BUN: 18 mg/dL (ref 6–23)
CO2: 28 mEq/L (ref 19–32)
Calcium: 10 mg/dL (ref 8.4–10.5)
Chloride: 103 mEq/L (ref 96–112)
Creatinine, Ser: 0.79 mg/dL (ref 0.40–1.20)
GFR: 73.38 mL/min (ref >60.00)
Glucose, Bld: 98 mg/dL (ref 70–99)
Potassium: 4.3 mEq/L (ref 3.5–5.1)
Sodium: 139 mEq/L (ref 135–145)

## 2020-01-03 LAB — HEMOGLOBIN A1C: Hgb A1c MFr Bld: 6 % (ref 4.6–6.5)

## 2020-01-03 LAB — URINALYSIS, ROUTINE W REFLEX MICROSCOPIC
Bilirubin Urine: NEGATIVE
Hgb urine dipstick: NEGATIVE
Ketones, ur: NEGATIVE
Leukocytes,Ua: NEGATIVE
Nitrite: NEGATIVE
RBC / HPF: NONE SEEN (ref 0–?)
Specific Gravity, Urine: 1.02 (ref 1.000–1.030)
Total Protein, Urine: NEGATIVE
Urine Glucose: NEGATIVE
Urobilinogen, UA: 0.2 (ref 0.0–1.0)
WBC, UA: NONE SEEN (ref 0–?)
pH: 7 (ref 5.0–8.0)

## 2020-01-03 LAB — HEPATIC FUNCTION PANEL
ALT: 49 U/L — ABNORMAL HIGH (ref 0–35)
AST: 37 U/L (ref 0–37)
Albumin: 4.2 g/dL (ref 3.5–5.2)
Alkaline Phosphatase: 91 U/L (ref 39–117)
Bilirubin, Direct: 0.2 mg/dL (ref 0.0–0.3)
Total Bilirubin: 0.9 mg/dL (ref 0.2–1.2)
Total Protein: 7.3 g/dL (ref 6.0–8.3)

## 2020-01-03 LAB — CBC WITH DIFFERENTIAL/PLATELET
Basophils Absolute: 0.1 10*3/uL (ref 0.0–0.1)
Basophils Relative: 1.2 % (ref 0.0–3.0)
Eosinophils Absolute: 0.1 10*3/uL (ref 0.0–0.7)
Eosinophils Relative: 1.2 % (ref 0.0–5.0)
HCT: 45.5 % (ref 36.0–46.0)
Hemoglobin: 15.5 g/dL — ABNORMAL HIGH (ref 12.0–15.0)
Lymphocytes Relative: 37.2 % (ref 12.0–46.0)
Lymphs Abs: 2.1 10*3/uL (ref 0.7–4.0)
MCHC: 34 g/dL (ref 30.0–36.0)
MCV: 94.6 fl (ref 78.0–100.0)
Monocytes Absolute: 0.5 10*3/uL (ref 0.1–1.0)
Monocytes Relative: 9.2 % (ref 3.0–12.0)
Neutro Abs: 2.9 10*3/uL (ref 1.4–7.7)
Neutrophils Relative %: 51.2 % (ref 43.0–77.0)
Platelets: 220 10*3/uL (ref 150.0–400.0)
RBC: 4.81 Mil/uL (ref 3.87–5.11)
RDW: 12.7 % (ref 11.5–15.5)
WBC: 5.7 10*3/uL (ref 4.0–10.5)

## 2020-01-03 LAB — TSH: TSH: 4.62 u[IU]/mL — ABNORMAL HIGH (ref 0.35–4.50)

## 2020-01-04 LAB — HIV ANTIBODY (ROUTINE TESTING W REFLEX): HIV 1&2 Ab, 4th Generation: NONREACTIVE

## 2020-01-09 ENCOUNTER — Encounter: Payer: BLUE CROSS/BLUE SHIELD | Admitting: Internal Medicine

## 2020-01-14 ENCOUNTER — Other Ambulatory Visit: Payer: Self-pay

## 2020-01-14 ENCOUNTER — Ambulatory Visit (INDEPENDENT_AMBULATORY_CARE_PROVIDER_SITE_OTHER): Payer: BC Managed Care – PPO | Admitting: Internal Medicine

## 2020-01-14 ENCOUNTER — Encounter: Payer: Self-pay | Admitting: Internal Medicine

## 2020-01-14 VITALS — BP 122/78 | HR 73 | Temp 98.0°F | Ht 65.0 in | Wt 186.2 lb

## 2020-01-14 DIAGNOSIS — Z0000 Encounter for general adult medical examination without abnormal findings: Secondary | ICD-10-CM | POA: Diagnosis not present

## 2020-01-14 DIAGNOSIS — E611 Iron deficiency: Secondary | ICD-10-CM | POA: Diagnosis not present

## 2020-01-14 DIAGNOSIS — E559 Vitamin D deficiency, unspecified: Secondary | ICD-10-CM

## 2020-01-14 DIAGNOSIS — E538 Deficiency of other specified B group vitamins: Secondary | ICD-10-CM

## 2020-01-14 DIAGNOSIS — R739 Hyperglycemia, unspecified: Secondary | ICD-10-CM | POA: Diagnosis not present

## 2020-01-14 MED ORDER — ROSUVASTATIN CALCIUM 20 MG PO TABS: 20.0000 mg | ORAL_TABLET | Freq: Every day | ORAL | 3 refills | Status: DC

## 2020-01-14 MED ORDER — MECLIZINE HCL 25 MG PO TABS: 25.0000 mg | ORAL_TABLET | Freq: Every day | ORAL | 3 refills | Status: AC | PRN

## 2020-01-14 NOTE — Assessment & Plan Note (Signed)
stable overall by history and exam, recent data reviewed with pt, and pt to continue medical treatment as before,  to f/u any worsening symptoms or concerns  

## 2020-01-14 NOTE — Assessment & Plan Note (Signed)
For replacement 

## 2020-01-14 NOTE — Assessment & Plan Note (Signed)

## 2020-01-14 NOTE — Progress Notes (Signed)
Subjective:    Patient ID: Brittany Burton, female    DOB: [birth year], 64 y.o.   MRN: [id number]  HPI  Here for wellness and f/u;  Overall doing ok;  Pt denies Chest pain, worsening SOB, DOE, wheezing, orthopnea, PND, worsening LE edema, palpitations, dizziness or syncope.  Pt denies neurological change such as new headache, facial or extremity weakness.  Pt denies polydipsia, polyuria, or low sugar symptoms. Pt states overall good compliance with treatment and medications, good tolerability, and has been trying to follow appropriate diet.  Pt denies worsening depressive symptoms, suicidal ideation or panic. No fever, night sweats, wt loss, loss of appetite, or other constitutional symptoms.  Pt states good ability with ADL's, has low fall risk, home safety reviewed and adequate, no other significant changes in hearing or vision, and only occasionally active with exercise. No new complaints Past Medical History:  Diagnosis Date  . BACK PAIN 03/18/2010  . COPD (chronic obstructive pulmonary disease) ([place]) 05/18/2017  . GERD 06/03/2009  . HYPERLIPIDEMIA 06/03/2009  . LEG PAIN, RIGHT 03/18/2010  . Osteopenia 06/2018   T score -1.8 FRAX 9.4% / 1.8%  . PAF (paroxysmal atrial fibrillation) ([place]) 09/04/2011  . POSTURAL LIGHTHEADEDNESS 06/17/2010  . Vertigo    chronic recurrent, BPV, probl left related  . VITAMIN D DEFICIENCY 06/03/2009   No past surgical history on file.  reports that she has been smoking cigarettes. She has a 75.25 pack-year smoking history. She has never used smokeless tobacco. She reports that she does not drink alcohol or use drugs. family history includes Cancer in her paternal grandmother; Colon cancer in her paternal grandmother; Esophageal cancer in her father; Melanoma in her father and sister. No Known Allergies Current Outpatient Medications on File Prior to Visit  Medication Sig Dispense Refill  . aspirin 81 MG EC tablet Take 1 tablet (81 mg total) by mouth daily. Swallow  whole. 30 tablet 12  . Calcium Carbonate-Vitamin D (CALCIUM + D PO) Take by mouth.     No current facility-administered medications on file prior to visit.   Review of Systems All otherwise neg per pt     Objective:   Physical Exam BP 122/78 (BP Location: Left Arm, Patient Position: Sitting, Cuff Size: Normal)   Pulse 73   Temp 98 F (36.7 C)   Ht 5\' 5"  (1.651 m)   Wt 186 lb 3.2 oz (84.5 kg)   SpO2 96%   BMI 30.99 kg/m  VS noted,  Constitutional: Pt appears in NAD HENT: Head: NCAT.  Right Ear: External ear normal.  Left Ear: External ear normal.  Eyes: . Pupils are equal, round, and reactive to light. Conjunctivae and EOM are normal Nose: without d/c or deformity Neck: Neck supple. Gross normal ROM Cardiovascular: Normal rate and regular rhythm.   Pulmonary/Chest: Effort normal and breath sounds without rales or wheezing.  Abd:  Soft, NT, ND, + BS, no organomegaly Neurological: Pt is alert. At baseline orientation, motor grossly intact Skin: Skin is warm. No rashes, other new lesions, no LE edema Psychiatric: Pt behavior is normal without agitation  All otherwise neg per pt Lab Results  Component Value Date   WBC 5.7 01/03/2020   HGB 15.5 (H) 01/03/2020   HCT 45.5 01/03/2020   PLT 220.0 01/03/2020   GLUCOSE 98 01/03/2020   CHOL 163 01/03/2020   TRIG 98.0 01/03/2020   HDL 53.40 01/03/2020   LDLDIRECT 145.2 01/28/2013   LDLCALC 90 01/03/2020   ALT 49 (H) 01/03/2020  AST 37 01/03/2020   NA 139 01/03/2020   K 4.3 01/03/2020   CL 103 01/03/2020   CREATININE 0.79 01/03/2020   BUN 18 01/03/2020   CO2 28 01/03/2020   TSH 4.62 (H) 01/03/2020   HGBA1C 6.0 01/03/2020      Assessment & Plan:

## 2020-01-14 NOTE — Patient Instructions (Signed)

## 2020-01-27 ENCOUNTER — Ambulatory Visit: Payer: BC Managed Care – PPO | Attending: Internal Medicine

## 2020-01-27 DIAGNOSIS — Z23 Encounter for immunization: Secondary | ICD-10-CM | POA: Insufficient documentation

## 2020-01-27 NOTE — Progress Notes (Signed)
   Covid-19 Vaccination Clinic  Name:  Brittany Burton    MRN: [id number] DOB: [birth year]  01/27/2020  Ms. Vorbeck was observed post Covid-19 immunization for 15 minutes without incident. She was provided with Vaccine Information Sheet and instruction to access the V-Safe system.   Ms. Selkirk was instructed to call 911 with any severe reactions post vaccine: Marland Kitchen Difficulty breathing  . Swelling of face and throat  . A fast heartbeat  . A bad rash all over body  . Dizziness and weakness   Immunizations Administered    Name Date Dose VIS Date Route   Pfizer COVID-19 Vaccine 01/27/2020  8:32 AM 0.3 mL 11/01/2019 Intramuscular   Manufacturer: [place]   Lot: [id number]   [place]: [id number]

## 2020-02-26 ENCOUNTER — Ambulatory Visit: Payer: BC Managed Care – PPO | Attending: Internal Medicine

## 2020-02-26 DIAGNOSIS — Z23 Encounter for immunization: Secondary | ICD-10-CM

## 2020-02-26 NOTE — Progress Notes (Signed)
   Covid-19 Vaccination Clinic  Name:  Brittany Burton    MRN: [id number] DOB: 06/28/1956  02/26/2020  [name] was observed post Covid-19 immunization for 15 minutes without incident. She was provided with Vaccine Information Sheet and instruction to access the V-Safe system.   [name] was instructed to call 911 with any severe reactions post vaccine: Marland Kitchen Difficulty breathing  . Swelling of face and throat  . A fast heartbeat  . A bad rash all over body  . Dizziness and weakness   Immunizations Administered    Name Date Dose VIS Date Route   Pfizer COVID-19 Vaccine 02/26/2020 10:44 AM 0.3 mL 11/01/2019 Intramuscular   Manufacturer: [place]   Lot: [id number]   [place]: [id number]

## 2020-04-14 DIAGNOSIS — L570 Actinic keratosis: Secondary | ICD-10-CM | POA: Diagnosis not present

## 2020-04-14 DIAGNOSIS — L821 Other seborrheic keratosis: Secondary | ICD-10-CM | POA: Diagnosis not present

## 2020-04-14 DIAGNOSIS — Z808 Family history of malignant neoplasm of other organs or systems: Secondary | ICD-10-CM | POA: Diagnosis not present

## 2020-04-14 DIAGNOSIS — Z85828 Personal history of other malignant neoplasm of skin: Secondary | ICD-10-CM | POA: Diagnosis not present

## 2020-04-14 DIAGNOSIS — D225 Melanocytic nevi of trunk: Secondary | ICD-10-CM | POA: Diagnosis not present

## 2020-05-08 ENCOUNTER — Ambulatory Visit
Admission: RE | Admit: 2020-05-08 | Discharge: 2020-05-08 | Disposition: A | Discharge status: Home / Self Care | Payer: BC Managed Care – PPO | Source: Ambulatory Visit | Attending: Acute Care | Admitting: Acute Care

## 2020-05-08 DIAGNOSIS — Z122 Encounter for screening for malignant neoplasm of respiratory organs: Secondary | ICD-10-CM

## 2020-05-08 DIAGNOSIS — F17210 Nicotine dependence, cigarettes, uncomplicated: Secondary | ICD-10-CM

## 2020-05-08 DIAGNOSIS — Z87891 Personal history of nicotine dependence: Secondary | ICD-10-CM | POA: Diagnosis not present

## 2020-05-12 NOTE — Progress Notes (Signed)
Please call patient and let them  know their  low dose Ct was read as a Lung RADS 2: nodules that are benign in appearance and behavior with a very low likelihood of becoming a clinically active cancer due to size or lack of growth. Recommendation per radiology is for a repeat LDCT in 12 months. .Please let them  know we will order and schedule their  annual screening scan for 04/2020.  Pt.  is  currently on statin therapy. Please place order for annual  screening scan for  04/2021 and fax results to PCP. Thanks so much.

## 2020-05-18 ENCOUNTER — Other Ambulatory Visit: Payer: Self-pay | Admitting: *Deleted

## 2020-05-18 ENCOUNTER — Encounter: Payer: Self-pay | Admitting: *Deleted

## 2020-05-18 DIAGNOSIS — F17210 Nicotine dependence, cigarettes, uncomplicated: Secondary | ICD-10-CM

## 2020-07-07 ENCOUNTER — Encounter: Payer: BC Managed Care – PPO | Admitting: Obstetrics and Gynecology

## 2020-07-13 ENCOUNTER — Ambulatory Visit (INDEPENDENT_AMBULATORY_CARE_PROVIDER_SITE_OTHER): Payer: BC Managed Care – PPO | Admitting: Obstetrics and Gynecology

## 2020-07-13 ENCOUNTER — Other Ambulatory Visit: Payer: Self-pay

## 2020-07-13 ENCOUNTER — Encounter: Payer: Self-pay | Admitting: Obstetrics and Gynecology

## 2020-07-13 VITALS — BP 122/78 | Ht 65.0 in | Wt 180.0 lb

## 2020-07-13 DIAGNOSIS — Z01419 Encounter for gynecological examination (general) (routine) without abnormal findings: Secondary | ICD-10-CM

## 2020-07-13 DIAGNOSIS — M8580 Other specified disorders of bone density and structure, unspecified site: Secondary | ICD-10-CM | POA: Diagnosis not present

## 2020-07-13 NOTE — Progress Notes (Signed)
   Brittany Burton Dec 23, 1955 [id number]  SUBJECTIVE:  64 y.o. G0P0000 female for annual routine gynecologic exam. She has no gynecologic concerns.  Current Outpatient Medications  Medication Sig Dispense Refill  . aspirin 81 MG EC tablet Take 1 tablet (81 mg total) by mouth daily. Swallow whole. 30 tablet 12  . Calcium Carbonate-Vitamin D (CALCIUM + D PO) Take by mouth.    . meclizine (ANTIVERT) 25 MG tablet Take 1 tablet (25 mg total) by mouth daily as needed. 90 tablet 3  . rosuvastatin (CRESTOR) 20 MG tablet Take 1 tablet (20 mg total) by mouth daily. 90 tablet 3   No current facility-administered medications for this visit.   Allergies: Patient has no known allergies.  No LMP recorded. Patient is postmenopausal.  Past medical history,surgical history, problem list, medications, allergies, family history and social history were all reviewed and documented as reviewed in the EPIC chart.  ROS:  Feeling well. No dyspnea or chest pain on exertion.  No abdominal pain, change in bowel habits, black or bloody stools.  No urinary tract symptoms. GYN ROS: no abnormal bleeding, pelvic pain or discharge, no breast pain or new or enlarging lumps on self exam.No neurological complaints.   OBJECTIVE:  BP 122/78   Ht 5\' 5"  (1.651 m)   Wt 180 lb (81.6 kg)   BMI 29.95 kg/m  The patient appears well, alert, oriented x 3, in no distress. ENT normal.  Neck supple. No cervical or supraclavicular adenopathy or thyromegaly.  Lungs are clear, good air entry, no wheezes, rhonchi or rales. S1 and S2 normal, no murmurs, regular rate and rhythm.  Abdomen soft without tenderness, guarding, mass or organomegaly.  Neurological is normal, no focal findings.  BREAST EXAM: breasts appear normal, no suspicious masses, no skin or nipple changes or axillary nodes  PELVIC EXAM: VULVA: normal appearing vulva with atrophy, 2 cm oval-shaped lesion on the left anterior labia minora appears consistent with sebaceous  cyst versus inclusion cyst, nontender, otherwise no masses, tenderness or lesions, VAGINA: normal appearing vagina with atrophy and normal color and discharge, no lesions, CERVIX: normal appearing cervix without discharge or lesions, UTERUS: uterus is normal size, shape, consistency and nontender, ADNEXA: normal adnexa in size, nontender and no masses  Chaperone: Caryn Bee present during the examination  ASSESSMENT:  64 y.o. G0P0000 here for annual gynecologic exam  PLAN:   1. Postmenopausal.  No significant hot flashes or night sweats.  No vaginal bleeding. 2. Pap smear 2019.  No significant history of abnormal Pap smears.  Next Pap smear due 2022 following the current guidelines recommending the 3 year interval. 3. Mammogram 07/2019.  Normal breast exam today.  She says she does have a mammogram scheduled next next month.   4. Colonoscopy 2019.  Recommended that she follow up at the recommended interval.   5. Osteopenia.  DEXA 06/2018 with T score -1.8, FRAX 9.4% / 1.8%.  History of low vitamin D, level was normal last year.  She will be checking her labs this year with her primary doctor.  I did recommend repeating the DEXA this year which is ordered and she will plan to schedule, but indicates she might possibly want to hold off on it until next year.   6. Health maintenance.  No labs today as she normally has these completed elsewhere.  Return annually or sooner, prn.  Joseph Pierini MD 07/13/20

## 2020-08-05 DIAGNOSIS — Z1231 Encounter for screening mammogram for malignant neoplasm of breast: Secondary | ICD-10-CM | POA: Diagnosis not present

## 2020-08-05 LAB — HM MAMMOGRAPHY

## 2020-08-17 ENCOUNTER — Encounter: Payer: Self-pay | Admitting: Internal Medicine

## 2021-01-11 ENCOUNTER — Encounter: Payer: Self-pay | Admitting: Internal Medicine

## 2021-01-11 DIAGNOSIS — R739 Hyperglycemia, unspecified: Secondary | ICD-10-CM

## 2021-01-11 DIAGNOSIS — E559 Vitamin D deficiency, unspecified: Secondary | ICD-10-CM

## 2021-01-11 DIAGNOSIS — E7849 Other hyperlipidemia: Secondary | ICD-10-CM

## 2021-01-12 ENCOUNTER — Other Ambulatory Visit (INDEPENDENT_AMBULATORY_CARE_PROVIDER_SITE_OTHER): Payer: BC Managed Care – PPO

## 2021-01-12 DIAGNOSIS — Z0000 Encounter for general adult medical examination without abnormal findings: Secondary | ICD-10-CM | POA: Diagnosis not present

## 2021-01-12 DIAGNOSIS — E559 Vitamin D deficiency, unspecified: Secondary | ICD-10-CM | POA: Diagnosis not present

## 2021-01-12 DIAGNOSIS — E611 Iron deficiency: Secondary | ICD-10-CM

## 2021-01-12 DIAGNOSIS — E538 Deficiency of other specified B group vitamins: Secondary | ICD-10-CM

## 2021-01-12 DIAGNOSIS — E7849 Other hyperlipidemia: Secondary | ICD-10-CM | POA: Diagnosis not present

## 2021-01-12 DIAGNOSIS — R739 Hyperglycemia, unspecified: Secondary | ICD-10-CM

## 2021-01-12 LAB — IBC PANEL
Iron: 113 ug/dL (ref 42–145)
Saturation Ratios: 31.7 % (ref 20.0–50.0)
Transferrin: 255 mg/dL (ref 212.0–360.0)

## 2021-01-12 LAB — CBC WITH DIFFERENTIAL/PLATELET
Basophils Absolute: 0 10*3/uL (ref 0.0–0.1)
Basophils Relative: 0.7 % (ref 0.0–3.0)
Eosinophils Absolute: 0.1 10*3/uL (ref 0.0–0.7)
Eosinophils Relative: 1.3 % (ref 0.0–5.0)
HCT: 43.9 % (ref 36.0–46.0)
Hemoglobin: 15 g/dL (ref 12.0–15.0)
Lymphocytes Relative: 32.4 % (ref 12.0–46.0)
Lymphs Abs: 1.8 10*3/uL (ref 0.7–4.0)
MCHC: 34.1 g/dL (ref 30.0–36.0)
MCV: 95.2 fl (ref 78.0–100.0)
Monocytes Absolute: 0.5 10*3/uL (ref 0.1–1.0)
Monocytes Relative: 8.6 % (ref 3.0–12.0)
Neutro Abs: 3.2 10*3/uL (ref 1.4–7.7)
Neutrophils Relative %: 57 % (ref 43.0–77.0)
Platelets: 206 10*3/uL (ref 150.0–400.0)
RBC: 4.61 Mil/uL (ref 3.87–5.11)
RDW: 13.4 % (ref 11.5–15.5)
WBC: 5.6 10*3/uL (ref 4.0–10.5)

## 2021-01-12 LAB — HEPATIC FUNCTION PANEL
ALT: 58 U/L — ABNORMAL HIGH (ref 0–35)
AST: 38 U/L — ABNORMAL HIGH (ref 0–37)
Albumin: 3.9 g/dL (ref 3.5–5.2)
Alkaline Phosphatase: 97 U/L (ref 39–117)
Bilirubin, Direct: 0.1 mg/dL (ref 0.0–0.3)
Total Bilirubin: 0.9 mg/dL (ref 0.2–1.2)
Total Protein: 6.8 g/dL (ref 6.0–8.3)

## 2021-01-12 LAB — BASIC METABOLIC PANEL
BUN: 14 mg/dL (ref 6–23)
CO2: 27 mEq/L (ref 19–32)
Calcium: 9.9 mg/dL (ref 8.4–10.5)
Chloride: 104 mEq/L (ref 96–112)
Creatinine, Ser: 0.73 mg/dL (ref 0.40–1.20)
GFR: 86.82 mL/min (ref >60.00)
Glucose, Bld: 98 mg/dL (ref 70–99)
Potassium: 4.7 mEq/L (ref 3.5–5.1)
Sodium: 139 mEq/L (ref 135–145)

## 2021-01-12 LAB — URINALYSIS, ROUTINE W REFLEX MICROSCOPIC
Bilirubin Urine: NEGATIVE
Hgb urine dipstick: NEGATIVE
Ketones, ur: NEGATIVE
Leukocytes,Ua: NEGATIVE
Nitrite: NEGATIVE
RBC / HPF: NONE SEEN (ref 0–?)
Specific Gravity, Urine: 1.025 (ref 1.000–1.030)
Total Protein, Urine: NEGATIVE
Urine Glucose: NEGATIVE
Urobilinogen, UA: 0.2 (ref 0.0–1.0)
pH: 6 (ref 5.0–8.0)

## 2021-01-12 LAB — LIPID PANEL
Cholesterol: 217 mg/dL — ABNORMAL HIGH (ref 0–200)
HDL: 41.5 mg/dL (ref >39.00)
LDL Cholesterol: 150 mg/dL — ABNORMAL HIGH (ref 0–99)
NonHDL: 175.67
Total CHOL/HDL Ratio: 5
Triglycerides: 127 mg/dL (ref 0.0–149.0)
VLDL: 25.4 mg/dL (ref 0.0–40.0)

## 2021-01-12 LAB — HEMOGLOBIN A1C: Hgb A1c MFr Bld: 5.7 % (ref 4.6–6.5)

## 2021-01-12 LAB — VITAMIN B12: Vitamin B-12: 361 pg/mL (ref 211–911)

## 2021-01-12 LAB — VITAMIN D 25 HYDROXY (VIT D DEFICIENCY, FRACTURES): VITD: 31.86 ng/mL (ref 30.00–100.00)

## 2021-01-12 LAB — TSH: TSH: 3.7 u[IU]/mL (ref 0.35–4.50)

## 2021-01-14 ENCOUNTER — Other Ambulatory Visit: Payer: Self-pay

## 2021-01-15 ENCOUNTER — Ambulatory Visit (INDEPENDENT_AMBULATORY_CARE_PROVIDER_SITE_OTHER): Payer: BC Managed Care – PPO | Admitting: Internal Medicine

## 2021-01-15 ENCOUNTER — Encounter: Payer: Self-pay | Admitting: Internal Medicine

## 2021-01-15 VITALS — BP 118/82 | HR 78 | Ht 65.0 in | Wt 174.0 lb

## 2021-01-15 DIAGNOSIS — R739 Hyperglycemia, unspecified: Secondary | ICD-10-CM

## 2021-01-15 DIAGNOSIS — Z0001 Encounter for general adult medical examination with abnormal findings: Secondary | ICD-10-CM

## 2021-01-15 DIAGNOSIS — Z0000 Encounter for general adult medical examination without abnormal findings: Secondary | ICD-10-CM

## 2021-01-15 DIAGNOSIS — F17200 Nicotine dependence, unspecified, uncomplicated: Secondary | ICD-10-CM

## 2021-01-15 DIAGNOSIS — E7849 Other hyperlipidemia: Secondary | ICD-10-CM | POA: Diagnosis not present

## 2021-01-15 DIAGNOSIS — E559 Vitamin D deficiency, unspecified: Secondary | ICD-10-CM

## 2021-01-15 DIAGNOSIS — E538 Deficiency of other specified B group vitamins: Secondary | ICD-10-CM | POA: Diagnosis not present

## 2021-01-15 DIAGNOSIS — J449 Chronic obstructive pulmonary disease, unspecified: Secondary | ICD-10-CM

## 2021-01-15 MED ORDER — ROSUVASTATIN CALCIUM 20 MG PO TABS
20.0000 mg | ORAL_TABLET | Freq: Every day | ORAL | 3 refills | Status: DC
Start: 2021-01-15 — End: 2022-02-03

## 2021-01-15 NOTE — Progress Notes (Signed)
Patient ID: Brittany Burton, female   DOB: Jan 05, 1956, 65 y.o.   MRN: [id number]         Chief Complaint:: wellness exam and f/u hld, tobacco, hyperglycemia, copd, low vit d       HPI:  Brittany Burton is a 65 y.o. female here for wellness exam; due for pap later this yr and will call herself; o/w up to date with preventive referrals and immunizations                        Also Pt denies chest pain, increased sob or doe, wheezing, orthopnea, PND, increased LE swelling, palpitations, dizziness or syncope.  Trying to follow lower chol diet.  Still smoking but very few < 1/2 ppd, still follows with yearly LDCT.  WIlling to have Cardiac CT scoring.   Pt denies polydipsia, polyuria,.  Not ready to quit.  No other new complaints Admits to not taking the crestor 20 mg but wiling to start.  Has been taking vit d   Wt Readings from Last 3 Encounters:  01/15/21 174 lb (78.9 kg)  07/13/20 180 lb (81.6 kg)  01/14/20 186 lb 3.2 oz (84.5 kg)   BP Readings from Last 3 Encounters:  01/15/21 118/82  07/13/20 122/78  01/14/20 122/78   Immunization History  Administered Date(s) Administered  . Influenza,inj,Quad PF,6+ Mos 08/28/2018, 08/02/2019, 09/03/2020  . Influenza-Unspecified 09/16/2015  . PFIZER(Purple Top)SARS-COV-2 Vaccination 01/27/2020, 02/26/2020, 09/10/2020  . Td 06/03/2009  . Tdap 01/07/2019  . Zoster 02/04/2013  . Zoster Recombinat (Shingrix) 08/29/2019, 11/25/2019  There are no preventive care reminders to display for this patient.    Past Medical History:  Diagnosis Date  . BACK PAIN 03/18/2010  . COPD (chronic obstructive pulmonary disease) ([place]) 05/18/2017  . GERD 06/03/2009  . HYPERLIPIDEMIA 06/03/2009  . LEG PAIN, RIGHT 03/18/2010  . Osteopenia 06/2018   T score -1.8 FRAX 9.4% / 1.8%  . PAF (paroxysmal atrial fibrillation) ([place]) 09/04/2011  . POSTURAL LIGHTHEADEDNESS 06/17/2010  . Vertigo    chronic recurrent, BPV, probl left related  . VITAMIN D DEFICIENCY 06/03/2009    History reviewed. No pertinent surgical history.  reports that she has been smoking cigarettes. She has a 75.25 pack-year smoking history. She has never used smokeless tobacco. She reports that she does not drink alcohol and does not use drugs. family history includes Cancer in her paternal grandmother; Colon cancer in her paternal grandmother; Esophageal cancer in her father; Melanoma in her father and sister. No Known Allergies Current Outpatient Medications on File Prior to Visit  Medication Sig Dispense Refill  . aspirin 81 MG EC tablet Take 1 tablet (81 mg total) by mouth daily. Swallow whole. 30 tablet 12  . Calcium Carbonate-Vitamin D (CALCIUM + D PO) Take by mouth.    . meclizine (ANTIVERT) 25 MG tablet Take 1 tablet (25 mg total) by mouth daily as needed. 90 tablet 3   No current facility-administered medications on file prior to visit.        ROS:  All others reviewed and negative.  Objective        PE:  BP 118/82   Pulse 78   Ht 5\' 5"  (1.651 m)   Wt 174 lb (78.9 kg)   SpO2 97%   BMI 28.96 kg/m                 Constitutional: Pt appears in NAD  HENT: Head: NCAT.                Right Ear: External ear normal.                 Left Ear: External ear normal.                Eyes: . Pupils are equal, round, and reactive to light. Conjunctivae and EOM are normal               Nose: without d/c or deformity               Neck: Neck supple. Gross normal ROM               Cardiovascular: Normal rate and regular rhythm.                 Pulmonary/Chest: Effort normal and breath sounds without rales or wheezing.                Abd:  Soft, NT, ND, + BS, no organomegaly               Neurological: Pt is alert. At baseline orientation, motor grossly intact               Skin: Skin is warm. No rashes, no other new lesions, LE edema - none               Psychiatric: Pt behavior is normal without agitation   Micro: none  Cardiac tracings I have personally interpreted  today:  none  Pertinent Radiological findings (summarize): none   Lab Results  Component Value Date   WBC 5.6 01/12/2021   HGB 15.0 01/12/2021   HCT 43.9 01/12/2021   PLT 206.0 01/12/2021   GLUCOSE 98 01/12/2021   CHOL 217 (H) 01/12/2021   TRIG 127.0 01/12/2021   HDL 41.50 01/12/2021   LDLDIRECT 145.2 01/28/2013   LDLCALC 150 (H) 01/12/2021   ALT 58 (H) 01/12/2021   AST 38 (H) 01/12/2021   NA 139 01/12/2021   K 4.7 01/12/2021   CL 104 01/12/2021   CREATININE 0.73 01/12/2021   BUN 14 01/12/2021   CO2 27 01/12/2021   TSH 3.70 01/12/2021   HGBA1C 5.7 01/12/2021   Assessment/Plan:  Brittany Burton is a 65 y.o. White or Caucasian [1] female with  has a past medical history of BACK PAIN (03/18/2010), COPD (chronic obstructive pulmonary disease) ([place]) (05/18/2017), GERD (06/03/2009), HYPERLIPIDEMIA (06/03/2009), LEG PAIN, RIGHT (03/18/2010), Osteopenia (06/2018), PAF (paroxysmal atrial fibrillation) ([place]) (09/04/2011), POSTURAL LIGHTHEADEDNESS (06/17/2010), Vertigo, and VITAMIN D DEFICIENCY (06/03/2009).  Encounter for well adult exam with abnormal findings Age and sex appropriate education and counseling updated with regular exercise and diet Referrals for preventative services - none needed except for pap later this yr Immunizations addressed - none needed Smoking counseling  - counseled to quit, pt not ready Evidence for depression or other mood disorder - none significant Most recent labs reviewed. I have personally reviewed and have noted: 1) the patient's medical and social history 2) The patient's current medications and supplements 3) The patient's height, weight, and BMI have been recorded in the chart   Smoker Counseled to quit, pt not ready but at least less now than in years past, pt is willing to have Ct Cardiac scoring to consider if quitting might be more able if abnormal  Hyperlipidemia Lab Results  Component Value Date   LDLCALC 150 (H) 01/12/2021   Stable, pt  to restart current statin crestor 20   Current Outpatient Medications (Cardiovascular):  .  rosuvastatin (CRESTOR) 20 MG tablet, Take 1 tablet (20 mg total) by mouth daily.   Current Outpatient Medications (Analgesics):  .  aspirin 81 MG EC tablet, Take 1 tablet (81 mg total) by mouth daily. Swallow whole.   Current Outpatient Medications (Other):  Marland Kitchen  Calcium Carbonate-Vitamin D (CALCIUM + D PO), Take by mouth. .  meclizine (ANTIVERT) 25 MG tablet, Take 1 tablet (25 mg total) by mouth daily as needed.   Hyperglycemia Lab Results  Component Value Date   HGBA1C 5.7 01/12/2021   Stable, pt to continue current medical treatment  - diet   COPD (chronic obstructive pulmonary disease) ([place]) Stable for now, cont current home tx - none 1  Vitamin D deficiency Last vitamin D Lab Results  Component Value Date   VD25OH 31.86 01/12/2021   Stable, cont oral replacement   Followup: Return in about 1 year (around 01/15/2022).  Cathlean Cower, MD 01/17/2021 4:10 AM [facility]

## 2021-01-15 NOTE — Patient Instructions (Signed)
Please stop smoking  We have discussed the Cardiac CT Score test to measure the calcification level (if any) in your heart arteries.  This test has been ordered in our [facility], so please call [facility] directly, as they prefer this, at [phone] to be scheduled.  Please continue all other medications as before, and refills have been done if requested - the crestor  Please have the pharmacy call with any other refills you may need.  Please continue your efforts at being more active, low cholesterol diet, and weight control.  You are otherwise up to date with prevention measures today.  Please keep your appointments with your specialists as you may have planned  Please make an Appointment to return for your 1 year visit, or sooner if needed, with Lab testing by Appointment as well, to be done about 3-5 days before at the [place] [facility] (so this is for TWO appointments - please see the scheduling desk as you leave)  Due to the ongoing Covid 19 pandemic, our lab now requires an appointment for any labs done at our office.  If you need labs done and do not have an appointment, please call our office ahead of time to schedule before presenting to the lab for your testing.

## 2021-01-17 ENCOUNTER — Encounter: Payer: Self-pay | Admitting: Internal Medicine

## 2021-01-17 NOTE — Assessment & Plan Note (Signed)
Lab Results  Component Value Date   LDLCALC 150 (H) 01/12/2021   Stable, pt to restart current statin crestor 20   Current Outpatient Medications (Cardiovascular):  .  rosuvastatin (CRESTOR) 20 MG tablet, Take 1 tablet (20 mg total) by mouth daily.   Current Outpatient Medications (Analgesics):  .  aspirin 81 MG EC tablet, Take 1 tablet (81 mg total) by mouth daily. Swallow whole.   Current Outpatient Medications (Other):  Marland Kitchen  Calcium Carbonate-Vitamin D (CALCIUM + D PO), Take by mouth. .  meclizine (ANTIVERT) 25 MG tablet, Take 1 tablet (25 mg total) by mouth daily as needed.

## 2021-01-17 NOTE — Assessment & Plan Note (Signed)
Last vitamin D Lab Results  Component Value Date   VD25OH 31.86 01/12/2021   Stable, cont oral replacement

## 2021-01-17 NOTE — Assessment & Plan Note (Signed)
Age and sex appropriate education and counseling updated with regular exercise and diet Referrals for preventative services - none needed except for pap later this yr Immunizations addressed - none needed Smoking counseling  - counseled to quit, pt not ready Evidence for depression or other mood disorder - none significant Most recent labs reviewed. I have personally reviewed and have noted: 1) the patient's medical and social history 2) The patient's current medications and supplements 3) The patient's height, weight, and BMI have been recorded in the chart

## 2021-01-17 NOTE — Assessment & Plan Note (Signed)
Counseled to quit, pt not ready but at least less now than in years past, pt is willing to have Ct Cardiac scoring to consider if quitting might be more able if abnormal

## 2021-01-17 NOTE — Assessment & Plan Note (Signed)
Lab Results  Component Value Date   HGBA1C 5.7 01/12/2021   Stable, pt to continue current medical treatment  - diet

## 2021-01-17 NOTE — Assessment & Plan Note (Signed)
Stable for now, cont current home tx - none 1

## 2021-02-08 ENCOUNTER — Encounter: Payer: Self-pay | Admitting: Internal Medicine

## 2021-02-08 ENCOUNTER — Ambulatory Visit (INDEPENDENT_AMBULATORY_CARE_PROVIDER_SITE_OTHER)
Admission: RE | Admit: 2021-02-08 | Discharge: 2021-02-08 | Disposition: A | Discharge status: Home / Self Care | Payer: Self-pay | Source: Ambulatory Visit | Attending: Internal Medicine | Admitting: Internal Medicine

## 2021-02-08 ENCOUNTER — Other Ambulatory Visit: Payer: Self-pay

## 2021-02-08 DIAGNOSIS — E7849 Other hyperlipidemia: Secondary | ICD-10-CM

## 2021-02-08 DIAGNOSIS — F17200 Nicotine dependence, unspecified, uncomplicated: Secondary | ICD-10-CM

## 2021-05-03 ENCOUNTER — Other Ambulatory Visit: Payer: Self-pay | Admitting: Acute Care

## 2021-05-03 ENCOUNTER — Telehealth: Payer: Self-pay | Admitting: Acute Care

## 2021-05-03 DIAGNOSIS — F17210 Nicotine dependence, cigarettes, uncomplicated: Secondary | ICD-10-CM

## 2021-05-03 NOTE — Telephone Encounter (Signed)
I have spoke with Mrs. Both and her LCS CT has been scheduled on 05/25/2021 @ 8:30am at [place] location

## 2021-05-03 NOTE — Telephone Encounter (Signed)
This is for her lung cancer screening CT.   Will forward message to [place].

## 2021-05-25 ENCOUNTER — Ambulatory Visit
Admission: RE | Admit: 2021-05-25 | Discharge: 2021-05-25 | Disposition: A | Discharge status: Home / Self Care | Payer: Medicare HMO | Source: Ambulatory Visit | Attending: Acute Care | Admitting: Acute Care

## 2021-05-25 DIAGNOSIS — F17210 Nicotine dependence, cigarettes, uncomplicated: Secondary | ICD-10-CM

## 2021-05-25 DIAGNOSIS — H5213 Myopia, bilateral: Secondary | ICD-10-CM | POA: Diagnosis not present

## 2021-05-25 DIAGNOSIS — H52223 Regular astigmatism, bilateral: Secondary | ICD-10-CM | POA: Diagnosis not present

## 2021-05-25 DIAGNOSIS — H524 Presbyopia: Secondary | ICD-10-CM | POA: Diagnosis not present

## 2021-05-25 DIAGNOSIS — J439 Emphysema, unspecified: Secondary | ICD-10-CM | POA: Diagnosis not present

## 2021-05-25 DIAGNOSIS — Z87891 Personal history of nicotine dependence: Secondary | ICD-10-CM | POA: Diagnosis not present

## 2021-05-25 DIAGNOSIS — R918 Other nonspecific abnormal finding of lung field: Secondary | ICD-10-CM | POA: Diagnosis not present

## 2021-05-25 DIAGNOSIS — H2513 Age-related nuclear cataract, bilateral: Secondary | ICD-10-CM | POA: Diagnosis not present

## 2021-05-27 ENCOUNTER — Encounter: Payer: Self-pay | Admitting: *Deleted

## 2021-05-27 DIAGNOSIS — F17210 Nicotine dependence, cigarettes, uncomplicated: Secondary | ICD-10-CM

## 2021-05-27 DIAGNOSIS — Z87891 Personal history of nicotine dependence: Secondary | ICD-10-CM

## 2021-05-27 NOTE — Progress Notes (Signed)
Please call patient and let them  know their  low dose Ct was read as a Lung RADS 2: nodules that are benign in appearance and behavior with a very low likelihood of becoming a clinically active cancer due to size or lack of growth. Recommendation per radiology is for a repeat LDCT in 12 months. .Please let them  know we will order and schedule their  annual screening scan for 05/2022. Pt.  is  currently on statin therapy. Please place order for annual  screening scan for  05/2022 and fax results to PCP. Thanks so much.

## 2021-07-13 NOTE — Progress Notes (Addendum)
65 y.o. G0P0000 Married White or Caucasian Not Hispanic or Latino female here for annual exam.   Patient states that her 10 year old sister was just dx with stage 3 ovarian cancer. Sister had negative genetic testing. She is one of 4 girls.  2 years before that her other sister had a benign tumor removed.   Sexually active, some discomfort. Not using a lubricant.   No bowel or bladder c/o.     No LMP recorded. Patient is postmenopausal.          Sexually active: Yes.    The current method of family planning is post menopausal status.    Exercising: Yes.     Golfer  Smoker:  yes less than a pack a day. No plans to quit.   Health Maintenance: Pap:  06/21/2018 WNL History of abnormal Pap:  no MMG:  08/05/20 Bi-rads 1 neg  BMD:   07/10/18 low bone mass,  T score -1.8, FRAX 9.4% / 1.8%. Colonoscopy: 12/12/17 every 10 years, normal.  TDaP:  01/07/2019 Gardasil: Na    reports that she has been smoking cigarettes. She has a 75.25 pack-year smoking history. She has never used smokeless tobacco. She reports that she does not drink alcohol and does not use drugs.  Past Medical History:  Diagnosis Date   BACK PAIN 03/18/2010   COPD (chronic obstructive pulmonary disease) ([facility]) 05/18/2017   GERD 06/03/2009   HYPERLIPIDEMIA 06/03/2009   LEG PAIN, RIGHT 03/18/2010   Osteopenia 06/2018   T score -1.8 FRAX 9.4% / 1.8%   PAF (paroxysmal atrial fibrillation) ([place]) 09/04/2011   POSTURAL LIGHTHEADEDNESS 06/17/2010   Vertigo    chronic recurrent, BPV, probl left related   VITAMIN D DEFICIENCY 06/03/2009    No past surgical history on file.  Current Outpatient Medications  Medication Sig Dispense Refill   aspirin 81 MG EC tablet Take 1 tablet (81 mg total) by mouth daily. Swallow whole. 30 tablet 12   Calcium Carbonate-Vitamin D (CALCIUM + D PO) Take by mouth.     meclizine (ANTIVERT) 25 MG tablet Take 1 tablet (25 mg total) by mouth daily as needed. 90 tablet 3   rosuvastatin (CRESTOR) 20 MG tablet  Take 1 tablet (20 mg total) by mouth daily. 90 tablet 3   No current facility-administered medications for this visit.    Family History  Problem Relation Age of Onset   Melanoma Father    Esophageal cancer Father    Ovarian cancer Sister 72       stage 91   Melanoma Sister    Cancer Paternal Grandmother        colon late 70's   Colon cancer Paternal Grandmother    Pancreatic cancer Neg Hx    Rectal cancer Neg Hx    Stomach cancer Neg Hx     Review of Systems  Exam:   BP 130/84   Pulse 72   Ht '5\' 5"'$  (1.651 m)   Wt 171 lb 3.2 oz (77.7 kg)   SpO2 98%   BMI 28.49 kg/m   Weight change: '@WEIGHTCHANGE'$ @ Height:   Height: '5\' 5"'$  (165.1 cm)  Ht Readings from Last 3 Encounters:  07/14/21 '5\' 5"'$  (1.651 m)  01/15/21 '5\' 5"'$  (1.651 m)  07/13/20 '5\' 5"'$  (1.651 m)    General appearance: alert, cooperative and appears stated age Head: Normocephalic, without obvious abnormality, atraumatic Neck: no adenopathy, supple, symmetrical, trachea midline and thyroid normal to inspection and palpation Breasts: normal appearance, no masses or  tenderness Abdomen: soft, non-tender; non distended,  no masses,  no organomegaly Extremities: extremities normal, atraumatic, no cyanosis or edema Skin: Skin color, texture, turgor normal. No rashes or lesions Lymph nodes: Cervical, supraclavicular, and axillary nodes normal. No abnormal inguinal nodes palpated Neurologic: Grossly normal   Pelvic: External genitalia:  no lesions              Urethra:  normal appearing urethra with no masses, tenderness or lesions              Bartholins and Skenes: normal                 Vagina: normal appearing vagina with normal color and discharge, no lesions. Minimal atrophy              Cervix: no lesions               Bimanual Exam:  Uterus:  normal size, contour, position, consistency, mobility, non-tender              Adnexa: no mass, fullness, tenderness               Rectovaginal: Confirms               Anus:   normal sphincter tone, no lesions    1. Encounter for gynecological examination without abnormal finding Discussed breast self exam Discussed calcium and vit D intake Labs with primary Mammogram due next month Colonoscopy UTD She is getting regular skin checks.   2. Family history of ovarian cancer Sister with ovarian cancer, negative genetic testing. Discussed the option of CA 125 and ultrasounds. Reviewed that it isn't standard of care. Reviewed risks of false +. She declines  3. Osteopenia, unspecified location Getting calcium, vit d and weight bearing exercise - DG Bone Density; Future  4. Dyspareunia, female Discussed using a vaginal lubricant, recommendations given Discussed the option of vaginal estrogen, she declines.   In addition to the breast and pelvic exam ~ 20 minutes was spent in evaluation, counseling and management of her other issues (fh ovarian cancer, osteopenia and dyspareunia)

## 2021-07-14 ENCOUNTER — Other Ambulatory Visit: Payer: Self-pay

## 2021-07-14 ENCOUNTER — Encounter: Payer: Self-pay | Admitting: Obstetrics and Gynecology

## 2021-07-14 ENCOUNTER — Encounter: Payer: BC Managed Care – PPO | Admitting: Obstetrics and Gynecology

## 2021-07-14 ENCOUNTER — Other Ambulatory Visit (HOSPITAL_COMMUNITY)
Admission: RE | Admit: 2021-07-14 | Discharge: 2021-07-14 | Disposition: A | Discharge status: Home / Self Care | Payer: Medicare HMO | Source: Ambulatory Visit | Attending: Obstetrics and Gynecology | Admitting: Obstetrics and Gynecology

## 2021-07-14 ENCOUNTER — Ambulatory Visit (INDEPENDENT_AMBULATORY_CARE_PROVIDER_SITE_OTHER): Payer: Medicare HMO | Admitting: Obstetrics and Gynecology

## 2021-07-14 VITALS — BP 130/84 | HR 72 | Ht 65.0 in | Wt 171.2 lb

## 2021-07-14 DIAGNOSIS — M8580 Other specified disorders of bone density and structure, unspecified site: Secondary | ICD-10-CM

## 2021-07-14 DIAGNOSIS — Z124 Encounter for screening for malignant neoplasm of cervix: Secondary | ICD-10-CM | POA: Diagnosis not present

## 2021-07-14 DIAGNOSIS — L570 Actinic keratosis: Secondary | ICD-10-CM | POA: Insufficient documentation

## 2021-07-14 DIAGNOSIS — Z01419 Encounter for gynecological examination (general) (routine) without abnormal findings: Secondary | ICD-10-CM | POA: Diagnosis present

## 2021-07-14 DIAGNOSIS — L814 Other melanin hyperpigmentation: Secondary | ICD-10-CM | POA: Insufficient documentation

## 2021-07-14 DIAGNOSIS — R69 Illness, unspecified: Secondary | ICD-10-CM | POA: Diagnosis not present

## 2021-07-14 DIAGNOSIS — N941 Unspecified dyspareunia: Secondary | ICD-10-CM | POA: Diagnosis not present

## 2021-07-14 DIAGNOSIS — Z8041 Family history of malignant neoplasm of ovary: Secondary | ICD-10-CM

## 2021-07-14 DIAGNOSIS — Z85828 Personal history of other malignant neoplasm of skin: Secondary | ICD-10-CM | POA: Insufficient documentation

## 2021-07-14 DIAGNOSIS — Z1151 Encounter for screening for human papillomavirus (HPV): Secondary | ICD-10-CM | POA: Diagnosis not present

## 2021-07-14 NOTE — Patient Instructions (Addendum)
Brittany Burton is a good vaginal lubricant.  EXERCISE   We recommended that you start or continue a regular exercise program for good health. Physical activity is anything that gets your body moving, some is better than none. The CDC recommends 150 minutes per week of Moderate-Intensity Aerobic Activity and 2 or more days of Muscle Strengthening Activity.  Benefits of exercise are limitless: helps weight loss/weight maintenance, improves mood and energy, helps with depression and anxiety, improves sleep, tones and strengthens muscles, improves balance, improves bone density, protects from chronic conditions such as heart disease, high blood pressure and diabetes and so much more. To learn more visit: WhyNotPoker.uy  DIET: Good nutrition starts with a healthy diet of fruits, vegetables, whole grains, and lean protein sources. Drink plenty of water for hydration. Minimize empty calories, sodium, sweets. For more information about dietary recommendations visit: GeekRegister.com.ee and [email or web address]://schaefer-mitchell.[email or web address]/  ALCOHOL:  Women should limit their alcohol intake to no more than 7 drinks/beers/glasses of wine (combined, not each!) per week. Moderation of alcohol intake to this level decreases your risk of breast cancer and liver damage.  If you are concerned that you may have a problem, or your friends have told you they are concerned about your drinking, there are many resources to help. A well-known program that is free, effective, and available to all people all over the nation is Alcoholics Anonymous.  Check out this site to learn more: BlockTaxes.se   CALCIUM AND VITAMIN D:  Adequate intake of calcium and Vitamin D are recommended for bone health.  You should be getting between 1000-1200 mg of calcium and 800 units of Vitamin D daily between diet and supplements  PAP SMEARS:  Pap smears, to check for cervical cancer  or precancers,  have traditionally been done yearly, scientific advances have shown that most women can have pap smears less often.  However, every woman still should have a physical exam from her gynecologist every year. It will include a breast check, inspection of the vulva and vagina to check for abnormal growths or skin changes, a visual exam of the cervix, and then an exam to evaluate the size and shape of the uterus and ovaries. We will also provide age appropriate advice regarding health maintenance, like when you should have certain vaccines, screening for sexually transmitted diseases, bone density testing, colonoscopy, mammograms, etc.   MAMMOGRAMS:  All women over 84 years old should have a routine mammogram.   COLON CANCER SCREENING: Now recommend starting at age 37. At this time colonoscopy is not covered for routine screening until 50. There are take home tests that can be done between 45-49.   COLONOSCOPY:  Colonoscopy to screen for colon cancer is recommended for all women at age 43.  We know, you hate the idea of the prep.  We agree, BUT, having colon cancer and not knowing it is worse!!  Colon cancer so often starts as a polyp that can be seen and removed at colonscopy, which can quite literally save your life!  And if your first colonoscopy is normal and you have no family history of colon cancer, most women don't have to have it again for 10 years.  Once every ten years, you can do something that may end up saving your life, right?  We will be happy to help you get it scheduled when you are ready.  Be sure to check your insurance coverage so you understand how much it will cost.  It may be covered as a preventative service  at no cost, but you should check your particular policy.      Breast Self-Awareness Breast self-awareness means being familiar with how your breasts look and feel. It involves checking your breasts regularly and reporting any changes to your health care  provider. Practicing breast self-awareness is important. A change in your breasts can be a sign of a serious medical problem. Being familiar with how your breasts look and feel allows you to find any problems early, when treatment is more likely to be successful. All women should practice breast self-awareness, including women who have had breast implants. How to do a breast self-exam One way to learn what is normal for your breasts and whether your breasts are changing is to do a breast self-exam. To do a breast self-exam: Look for Changes  Remove all the clothing above your waist. Stand in front of a mirror in a room with good lighting. Put your hands on your hips. Push your hands firmly downward. Compare your breasts in the mirror. Look for differences between them (asymmetry), such as: Differences in shape. Differences in size. Puckers, dips, and bumps in one breast and not the other. Look at each breast for changes in your skin, such as: Redness. Scaly areas. Look for changes in your nipples, such as: Discharge. Bleeding. Dimpling. Redness. A change in position. Feel for Changes Carefully feel your breasts for lumps and changes. It is best to do this while lying on your back on the floor and again while sitting or standing in the shower or tub with soapy water on your skin. Feel each breast in the following way: Place the arm on the side of the breast you are examining above your head. Feel your breast with the other hand. Start in the nipple area and make  inch (2 cm) overlapping circles to feel your breast. Use the pads of your three middle fingers to do this. Apply light pressure, then medium pressure, then firm pressure. The light pressure will allow you to feel the tissue closest to the skin. The medium pressure will allow you to feel the tissue that is a little deeper. The firm pressure will allow you to feel the tissue close to the ribs. Continue the overlapping circles,  moving downward over the breast until you feel your ribs below your breast. Move one finger-width toward the center of the body. Continue to use the  inch (2 cm) overlapping circles to feel your breast as you move slowly up toward your collarbone. Continue the up and down exam using all three pressures until you reach your armpit.  Write Down What You Find  Write down what is normal for each breast and any changes that you find. Keep a written record with breast changes or normal findings for each breast. By writing this information down, you do not need to depend only on memory for size, tenderness, or location. Write down where you are in your menstrual cycle, if you are still menstruating. If you are having trouble noticing differences in your breasts, do not get discouraged. With time you will become more familiar with the variations in your breasts and more comfortable with the exam. How often should I examine my breasts? Examine your breasts every month. If you are breastfeeding, the best time to examine your breasts is after a feeding or after using a breast pump. If you menstruate, the best time to examine your breasts is 5-7 days after your period is over. During your period, your breasts  are lumpier, and it may be more difficult to notice changes. When should I see my health care provider? See your health care provider if you notice: A change in shape or size of your breasts or nipples. A change in the skin of your breast or nipples, such as a reddened or scaly area. Unusual discharge from your nipples. A lump or thick area that was not there before. Pain in your breasts. Anything that concerns you. Osteopenia  Osteopenia is a loss of thickness (density) inside the bones. Another name for osteopenia is low bone mass. Mild osteopenia is a normal part of aging. It is not a disease, and it does notcause symptoms. However, if you have osteopenia and continue to lose bone mass, you could  develop a condition that causes the bones to become thin and break more easily (osteoporosis). Osteoporosis can cause you to lose some height, have back pain, and have a stooped posture. Although osteopenia is not a disease, making changes to your lifestyle and diet can help to prevent osteopenia from developing intoosteoporosis. What are the causes? Osteopenia is caused by loss of calcium in the bones. Bones are constantly changing. Old bone cells are continually being replaced with new bone cells.This process builds new bone. The mineral calcium is needed to build new bone and maintain bone density. Bone density is usually highest around age 66. After that, most people's bodiescannot replace all the bone they have lost with new bone. What increases the risk? You are more likely to develop this condition if: You are older than age 97. You are a woman who went through menopause early. You have a long illness that keeps you in bed. You do not get enough exercise. You lack certain nutrients (malnutrition). You have an overactive thyroid gland (hyperthyroidism). You use products that contain nicotine or tobacco, such as cigarettes, e-cigarettes and chewing tobacco, or you drink a lot of alcohol. You are taking medicines that weaken the bones, such as steroids. What are the signs or symptoms? This condition does not cause any symptoms. You may have a slightly higher risk for bone breaks (fractures), so getting fractures more easily than normal may be an indication ofosteopenia. How is this diagnosed? This condition may be diagnosed based on an X-ray exam that measures bone density (dual-energy X-ray absorptiometry, or DEXA). This test can measure bone density in your hips, spine, and wrists. Osteopenia has no symptoms, so this condition is usually diagnosed after a routine bone density screening test is done for osteoporosis. This routine screening is usually done for: Women who are age 68 or  older. Men who are age 80 or older. If you have risk factors for osteopenia, you may have the screening test at anearlier age. How is this treated? Making dietary and lifestyle changes can lower your risk for osteoporosis. If you have severe osteopenia that is close to becoming osteoporosis, this condition can be treated with medicines and dietary supplements such as calciumand vitamin D. These supplements help to rebuild bone density. Follow these instructions at home: Eating and drinking Eat a diet that is high in calcium and vitamin D. Calcium is found in dairy products, beans, salmon, and leafy green vegetables like spinach and broccoli. Look for foods that have vitamin D and calcium added to them (fortified foods), such as orange juice, cereal, and bread.  Lifestyle Do 30 minutes or more of a weight-bearing exercise every day, such as walking, jogging, or playing a sport. These types of exercises strengthen  the bones. Do not use any products that contain nicotine or tobacco, such as cigarettes, e-cigarettes, and chewing tobacco. If you need help quitting, ask your health care provider. Do not drink alcohol if: Your health care provider tells you not to drink. You are pregnant, may be pregnant, or are planning to become pregnant. If you drink alcohol: Limit how much you use to: 0-1 drink a day for women. 0-2 drinks a day for men. Be aware of how much alcohol is in your drink. In the [place] one drink equals one 12 oz bottle of beer (355 mL), one 5 oz glass of wine (148 mL), or one 1 oz glass of hard liquor (44 mL). General instructions Take over-the-counter and prescription medicines only as told by your health care provider. These include vitamins and supplements. Take precautions at home to lower your risk of falling, such as: Keeping rooms well-lit and free of clutter, such as cords. Installing safety rails on stairs. Using rubber mats in the bathroom or other areas that are often  wet or slippery. Keep all follow-up visits. This is important. Contact a health care provider if: You have not had a bone density screening for osteoporosis and you are: A woman who is age 31 or older. A man who is age 73 or older. You are a postmenopausal woman who has not had a bone density screening for osteoporosis. You are older than age 5 and you want to know if you should have bone density screening for osteoporosis. Summary Osteopenia is a loss of thickness (density) inside the bones. Another name for osteopenia is low bone mass. Osteopenia is not a disease, but it may increase your risk for a condition that causes the bones to become thin and break more easily (osteoporosis). You may be at risk for osteopenia if you are older than age 22 or if you are a woman who went through early menopause. Osteopenia does not cause any symptoms, but it can be diagnosed with a bone density screening test. Dietary and lifestyle changes are the first treatment for osteopenia. These may lower your risk for osteoporosis. This information is not intended to replace advice given to you by your health care provider. Make sure you discuss any questions you have with your healthcare provider. Document Revised: 04/23/2020 Document Reviewed: 04/23/2020 Elsevier Patient Education  2022 [place]. Atrophic Vaginitis  Atrophic vaginitis is a condition in which the tissues that line the vagina become dry and thin. This condition is most common in women who have stopped having regular menstrual periods (are in menopause). This usually starts when a woman is 39 to 65 years old. That is the timewhen a woman's estrogen levels begin to decrease. Estrogen is a female hormone. It helps to keep the tissues of the vagina moist. It stimulates the vagina to produce a clear fluid that lubricates the vagina for sex. This fluid also protects the vagina from infection. Lack of estrogen can cause the lining of the vagina to get  thinner and dryer. The vagina may also shrink in size. It may become less elastic. Atrophic vaginitis tends toget worse over time as a woman's estrogen level drops. What are the causes? This condition is caused by the normal drop in estrogen that happens around thetime of menopause. What increases the risk? Certain conditions or situations may lower a woman's estrogen level, leading to a higher risk for atrophic vaginitis. You are more likely to develop this condition if: You are taking medicines that block  estrogen. You have had your ovaries removed. You are being treated for cancer with radiation or medicines (chemotherapy). You have given birth or are breastfeeding. You are older than age 41. You smoke. What are the signs or symptoms? Symptoms of this condition include: Pain, soreness, a feeling of pressure, or bleeding during sex (dyspareunia). Vaginal burning, irritation, or itching. Pain or bleeding when a speculum is used in a vaginal exam. Having burning pain while urinating. Vaginal discharge. In some cases, there are no symptoms. How is this diagnosed? This condition is diagnosed based on your medical history and a physical exam. This will include a pelvic exam that checks the vaginal tissues. Though rare, you may also have other tests, including: A urine test. A test that checks the acid balance in your vagina (acid balance test). How is this treated? Treatment for this condition depends on how severe your symptoms are. Treatment may include: Using an over-the-counter vaginal lubricant before sex. Using a long-acting vaginal moisturizer. Using low-dose estrogen for moderate to severe symptoms that do not respond to other treatments. Options include creams, tablets, and inserts (vaginal rings). Before you use a vaginal estrogen, tell your health care provider if you have a history of: Breast cancer. Endometrial cancer. Blood clots. If you are not sexually active and your  symptoms are very mild, you may notneed treatment. Follow these instructions at home: Medicines Take over-the-counter and prescription medicines only as told by your health care provider. Do not use herbal or alternative medicines unless your health care provider says that you can. Use over-the-counter creams, lubricants, or moisturizers for dryness only as told by your health care provider. General instructions If your atrophic vaginitis is caused by menopause, discuss all of your menopause symptoms and treatment options with your health care provider. Do not douche. Do not use products that can make your vagina dry. These include: Scented feminine sprays. Scented tampons. Scented soaps. Vaginal sex can help to improve blood flow and elasticity of vaginal tissue. If you choose to have sex and it hurts, try using a water-soluble lubricant or moisturizer right before having sex. Contact a health care provider if: Your discharge looks different than normal. Your vagina has an unusual smell. You have new symptoms. Your symptoms do not improve with treatment. Your symptoms get worse. Summary Atrophic vaginitis is a condition in which the tissues that line the vagina become dry and thin. It is most common in women who have stopped having regular menstrual periods (are in menopause). Treatment options include using vaginal lubricants and low-dose vaginal estrogen. Contact a health care provider if your vagina has an unusual smell, or if your symptoms get worse or do not improve after treatment. This information is not intended to replace advice given to you by your health care provider. Make sure you discuss any questions you have with your healthcare provider. Document Revised: 05/07/2020 Document Reviewed: 05/07/2020 Elsevier Patient Education  [place].

## 2021-07-14 NOTE — Addendum Note (Signed)
Addended by: Dorothy Spark on: 07/14/2021 04:35 PM   Modules accepted: Orders

## 2021-07-14 NOTE — Addendum Note (Signed)
Addended by: Dorothy Spark on: 07/14/2021 08:51 AM   Modules accepted: Level of Service

## 2021-07-19 LAB — CYTOLOGY - PAP
Comment: NEGATIVE
Diagnosis: NEGATIVE
High risk HPV: NEGATIVE

## 2021-07-28 ENCOUNTER — Encounter (INDEPENDENT_AMBULATORY_CARE_PROVIDER_SITE_OTHER): Payer: Medicare HMO

## 2021-07-28 ENCOUNTER — Other Ambulatory Visit: Payer: Self-pay

## 2021-07-28 ENCOUNTER — Other Ambulatory Visit: Payer: Self-pay | Admitting: Obstetrics and Gynecology

## 2021-07-28 DIAGNOSIS — Z780 Asymptomatic menopausal state: Secondary | ICD-10-CM

## 2021-07-28 DIAGNOSIS — M8589 Other specified disorders of bone density and structure, multiple sites: Secondary | ICD-10-CM | POA: Diagnosis not present

## 2021-07-28 DIAGNOSIS — M8580 Other specified disorders of bone density and structure, unspecified site: Secondary | ICD-10-CM

## 2021-08-03 ENCOUNTER — Other Ambulatory Visit: Payer: Self-pay

## 2021-08-03 MED ORDER — METRONIDAZOLE 500 MG PO TABS: 500.0000 mg | ORAL_TABLET | Freq: Two times a day (BID) | ORAL | 0 refills | Status: DC

## 2021-08-09 DIAGNOSIS — Z1231 Encounter for screening mammogram for malignant neoplasm of breast: Secondary | ICD-10-CM | POA: Diagnosis not present

## 2021-12-28 ENCOUNTER — Encounter: Payer: Self-pay | Admitting: Internal Medicine

## 2022-01-05 ENCOUNTER — Other Ambulatory Visit (INDEPENDENT_AMBULATORY_CARE_PROVIDER_SITE_OTHER): Payer: Medicare HMO

## 2022-01-05 DIAGNOSIS — E538 Deficiency of other specified B group vitamins: Secondary | ICD-10-CM | POA: Diagnosis not present

## 2022-01-05 DIAGNOSIS — E7849 Other hyperlipidemia: Secondary | ICD-10-CM | POA: Diagnosis not present

## 2022-01-05 DIAGNOSIS — E559 Vitamin D deficiency, unspecified: Secondary | ICD-10-CM | POA: Diagnosis not present

## 2022-01-05 DIAGNOSIS — R739 Hyperglycemia, unspecified: Secondary | ICD-10-CM | POA: Diagnosis not present

## 2022-01-05 LAB — URINALYSIS, ROUTINE W REFLEX MICROSCOPIC
Bilirubin Urine: NEGATIVE
Hgb urine dipstick: NEGATIVE
Ketones, ur: NEGATIVE
Leukocytes,Ua: NEGATIVE
Nitrite: NEGATIVE
RBC / HPF: NONE SEEN (ref 0–?)
Specific Gravity, Urine: 1.02 (ref 1.000–1.030)
Total Protein, Urine: NEGATIVE
Urine Glucose: NEGATIVE
Urobilinogen, UA: 0.2 (ref 0.0–1.0)
pH: 6 (ref 5.0–8.0)

## 2022-01-05 LAB — CBC WITH DIFFERENTIAL/PLATELET
Basophils Absolute: 0 10*3/uL (ref 0.0–0.1)
Basophils Relative: 0.7 % (ref 0.0–3.0)
Eosinophils Absolute: 0.1 10*3/uL (ref 0.0–0.7)
Eosinophils Relative: 1.6 % (ref 0.0–5.0)
HCT: 44.8 % (ref 36.0–46.0)
Hemoglobin: 15.1 g/dL — ABNORMAL HIGH (ref 12.0–15.0)
Lymphocytes Relative: 32 % (ref 12.0–46.0)
Lymphs Abs: 1.9 10*3/uL (ref 0.7–4.0)
MCHC: 33.8 g/dL (ref 30.0–36.0)
MCV: 95.2 fl (ref 78.0–100.0)
Monocytes Absolute: 0.5 10*3/uL (ref 0.1–1.0)
Monocytes Relative: 9.1 % (ref 3.0–12.0)
Neutro Abs: 3.4 10*3/uL (ref 1.4–7.7)
Neutrophils Relative %: 56.6 % (ref 43.0–77.0)
Platelets: 200 10*3/uL (ref 150.0–400.0)
RBC: 4.7 Mil/uL (ref 3.87–5.11)
RDW: 12.8 % (ref 11.5–15.5)
WBC: 6.1 10*3/uL (ref 4.0–10.5)

## 2022-01-05 LAB — BASIC METABOLIC PANEL
BUN: 22 mg/dL (ref 6–23)
CO2: 26 mEq/L (ref 19–32)
Calcium: 10 mg/dL (ref 8.4–10.5)
Chloride: 107 mEq/L (ref 96–112)
Creatinine, Ser: 0.83 mg/dL (ref 0.40–1.20)
GFR: 73.92 mL/min (ref >60.00)
Glucose, Bld: 102 mg/dL — ABNORMAL HIGH (ref 70–99)
Potassium: 5.3 mEq/L — ABNORMAL HIGH (ref 3.5–5.1)
Sodium: 143 mEq/L (ref 135–145)

## 2022-01-05 LAB — HEPATIC FUNCTION PANEL
ALT: 41 U/L — ABNORMAL HIGH (ref 0–35)
AST: 30 U/L (ref 0–37)
Albumin: 4.3 g/dL (ref 3.5–5.2)
Alkaline Phosphatase: 91 U/L (ref 39–117)
Bilirubin, Direct: 0.2 mg/dL (ref 0.0–0.3)
Total Bilirubin: 0.9 mg/dL (ref 0.2–1.2)
Total Protein: 7.8 g/dL (ref 6.0–8.3)

## 2022-01-05 LAB — LIPID PANEL
Cholesterol: 147 mg/dL (ref 0–200)
HDL: 51.2 mg/dL (ref >39.00)
LDL Cholesterol: 80 mg/dL (ref 0–99)
NonHDL: 95.71
Total CHOL/HDL Ratio: 3
Triglycerides: 81 mg/dL (ref 0.0–149.0)
VLDL: 16.2 mg/dL (ref 0.0–40.0)

## 2022-01-05 LAB — VITAMIN D 25 HYDROXY (VIT D DEFICIENCY, FRACTURES): VITD: 49.56 ng/mL (ref 30.00–100.00)

## 2022-01-05 LAB — HEMOGLOBIN A1C: Hgb A1c MFr Bld: 5.7 % (ref 4.6–6.5)

## 2022-01-05 LAB — TSH: TSH: 3.05 u[IU]/mL (ref 0.35–5.50)

## 2022-01-05 LAB — VITAMIN B12: Vitamin B-12: 262 pg/mL (ref 211–911)

## 2022-01-17 ENCOUNTER — Other Ambulatory Visit: Payer: Self-pay

## 2022-01-17 ENCOUNTER — Ambulatory Visit (INDEPENDENT_AMBULATORY_CARE_PROVIDER_SITE_OTHER): Payer: Medicare HMO | Admitting: Internal Medicine

## 2022-01-17 ENCOUNTER — Encounter: Payer: Self-pay | Admitting: Internal Medicine

## 2022-01-17 VITALS — BP 122/66 | HR 69 | Temp 98.0°F | Ht 65.0 in | Wt 178.0 lb

## 2022-01-17 DIAGNOSIS — Z0001 Encounter for general adult medical examination with abnormal findings: Secondary | ICD-10-CM | POA: Diagnosis not present

## 2022-01-17 DIAGNOSIS — Z23 Encounter for immunization: Secondary | ICD-10-CM | POA: Diagnosis not present

## 2022-01-17 DIAGNOSIS — R739 Hyperglycemia, unspecified: Secondary | ICD-10-CM

## 2022-01-17 DIAGNOSIS — R69 Illness, unspecified: Secondary | ICD-10-CM | POA: Diagnosis not present

## 2022-01-17 DIAGNOSIS — E559 Vitamin D deficiency, unspecified: Secondary | ICD-10-CM | POA: Diagnosis not present

## 2022-01-17 DIAGNOSIS — J449 Chronic obstructive pulmonary disease, unspecified: Secondary | ICD-10-CM

## 2022-01-17 DIAGNOSIS — E538 Deficiency of other specified B group vitamins: Secondary | ICD-10-CM | POA: Diagnosis not present

## 2022-01-17 DIAGNOSIS — F17200 Nicotine dependence, unspecified, uncomplicated: Secondary | ICD-10-CM | POA: Diagnosis not present

## 2022-01-17 DIAGNOSIS — E785 Hyperlipidemia, unspecified: Secondary | ICD-10-CM

## 2022-01-17 NOTE — Assessment & Plan Note (Signed)
Lab Results  Component Value Date   LDLCALC 80 01/05/2022   Stable, pt to continue current statin crestor

## 2022-01-17 NOTE — Patient Instructions (Addendum)
You had the Prevnar 20 pneumovax shot today  Please continue all other medications as before, and refills have been done if requested.  Please have the pharmacy call with any other refills you may need.  Please continue your efforts at being more active, low cholesterol diet, and weight control.  You are otherwise up to date with prevention measures today.  Please keep your appointments with your specialists as you may have planned  Please make an Appointment to return for your 1 year visit, or sooner if needed, with Lab testing by Appointment as well, to be done about 3-5 days before at the [facility] (so this is for TWO appointments - please see the scheduling desk as you leave)   Due to the ongoing Covid 19 pandemic, our lab now requires an appointment for any labs done at our office.  If you need labs done and do not have an appointment, please call our office ahead of time to schedule before presenting to the lab for your testing.

## 2022-01-17 NOTE — Assessment & Plan Note (Addendum)
Age and sex appropriate education and counseling updated with regular exercise and diet Referrals for preventative services - none needed Immunizations addressed - declines covid booster, for prevnar 20 Smoking counseling  - pt counseled to quit, pt not ready Evidence for depression or other mood disorder - none significant Most recent labs reviewed. I have personally reviewed and have noted: 1) the patient's medical and social history 2) The patient's current medications and supplements 3) The patient's height, weight, and BMI have been recorded in the chart

## 2022-01-17 NOTE — Assessment & Plan Note (Signed)
Last vitamin D Lab Results  Component Value Date   VD25OH 49.56 01/05/2022   Stable, cont oral replacement

## 2022-01-17 NOTE — Assessment & Plan Note (Signed)
Stable, cont inhaler prn 

## 2022-01-17 NOTE — Assessment & Plan Note (Signed)
Pt counsled to quit, pt not ready °

## 2022-01-17 NOTE — Progress Notes (Signed)
Patient ID: Brittany Burton, female   DOB: 28-Apr-1956, 66 y.o.   MRN: [id number]         Chief Complaint:: wellness exam and hyperglycemia, hld, smoker, vit d deficiency       HPI:  Brittany Burton is a 66 y.o. female here for wellness exam; declines covid booster, due for prevnar 20, o/w up to date                        Also still smoking, not ready to quit.  Pt denies chest pain, increased sob or doe, wheezing, orthopnea, PND, increased LE swelling, palpitations, dizziness or syncope.   Pt denies polydipsia, polyuria, or new focal neuro s/s.    Pt denies fever, wt loss, night sweats, loss of appetite, or other constitutional symptoms     Wt Readings from Last 3 Encounters:  01/17/22 178 lb (80.7 kg)  07/14/21 171 lb 3.2 oz (77.7 kg)  01/15/21 174 lb (78.9 kg)   BP Readings from Last 3 Encounters:  01/17/22 122/66  07/14/21 130/84  01/15/21 118/82   Immunization History  Administered Date(s) Administered   Influenza,inj,Quad PF,6+ Mos 08/28/2018, 08/02/2019, 09/03/2020   Influenza-Unspecified 09/16/2015, 08/26/2021   PFIZER(Purple Top)SARS-COV-2 Vaccination 01/27/2020, 02/26/2020, 09/10/2020, 08/26/2021   PNEUMOCOCCAL CONJUGATE-20 01/17/2022   Td 06/03/2009   Tdap 01/07/2019   Zoster Recombinat (Shingrix) 08/29/2019, 11/25/2019   Zoster, Live 02/04/2013   There are no preventive care reminders to display for this patient.     Past Medical History:  Diagnosis Date   BACK PAIN 03/18/2010   COPD (chronic obstructive pulmonary disease) ([place]) 05/18/2017   GERD 06/03/2009   HYPERLIPIDEMIA 06/03/2009   LEG PAIN, RIGHT 03/18/2010   Osteopenia 06/2018   T score -1.8 FRAX 9.4% / 1.8%   PAF (paroxysmal atrial fibrillation) ([place]) 09/04/2011   POSTURAL LIGHTHEADEDNESS 06/17/2010   Vertigo    chronic recurrent, BPV, probl left related   VITAMIN D DEFICIENCY 06/03/2009   History reviewed. No pertinent surgical history.  reports that she has been smoking cigarettes. She has a 75.25  pack-year smoking history. She has never used smokeless tobacco. She reports that she does not drink alcohol and does not use drugs. family history includes Cancer in her paternal grandmother; Colon cancer in her paternal grandmother; Esophageal cancer in her father; Melanoma in her father and sister; Ovarian cancer (age of onset: 48) in her sister. No Known Allergies Current Outpatient Medications on File Prior to Visit  Medication Sig Dispense Refill   aspirin 81 MG EC tablet Take 1 tablet (81 mg total) by mouth daily. Swallow whole. 30 tablet 12   Calcium Carbonate-Vitamin D (CALCIUM + D PO) Take by mouth.     meclizine (ANTIVERT) 25 MG tablet Take 1 tablet (25 mg total) by mouth daily as needed. 90 tablet 3   rosuvastatin (CRESTOR) 20 MG tablet Take 1 tablet (20 mg total) by mouth daily. 90 tablet 3   No current facility-administered medications on file prior to visit.        ROS:  All others reviewed and negative.  Objective        PE:  BP 122/66 (BP Location: Right Arm, Patient Position: Sitting, Cuff Size: Large)    Pulse 69    Temp 98 F (36.7 C) (Oral)    Ht 5\' 5"  (1.651 m)    Wt 178 lb (80.7 kg)    SpO2 96%    BMI 29.62 kg/m  Constitutional: Pt appears in NAD               HENT: Head: NCAT.                Right Ear: External ear normal.                 Left Ear: External ear normal.                Eyes: . Pupils are equal, round, and reactive to light. Conjunctivae and EOM are normal               Nose: without d/c or deformity               Neck: Neck supple. Gross normal ROM               Cardiovascular: Normal rate and regular rhythm.                 Pulmonary/Chest: Effort normal and breath sounds without rales or wheezing.                Abd:  Soft, NT, ND, + BS, no organomegaly               Neurological: Pt is alert. At baseline orientation, motor grossly intact               Skin: Skin is warm. No rashes, no other new lesions, LE edema - none                Psychiatric: Pt behavior is normal without agitation   Micro: none  Cardiac tracings I have personally interpreted today:  none  Pertinent Radiological findings (summarize): none   Lab Results  Component Value Date   WBC 6.1 01/05/2022   HGB 15.1 (H) 01/05/2022   HCT 44.8 01/05/2022   PLT 200.0 01/05/2022   GLUCOSE 102 (H) 01/05/2022   CHOL 147 01/05/2022   TRIG 81.0 01/05/2022   HDL 51.20 01/05/2022   LDLDIRECT 145.2 01/28/2013   LDLCALC 80 01/05/2022   ALT 41 (H) 01/05/2022   AST 30 01/05/2022   NA 143 01/05/2022   K 5.3 (H) 01/05/2022   CL 107 01/05/2022   CREATININE 0.83 01/05/2022   BUN 22 01/05/2022   CO2 26 01/05/2022   TSH 3.05 01/05/2022   HGBA1C 5.7 01/05/2022   Assessment/Plan:  Brittany Burton is a 66 y.o. White or Caucasian [1] female with  has a past medical history of BACK PAIN (03/18/2010), COPD (chronic obstructive pulmonary disease) ([place]) (05/18/2017), GERD (06/03/2009), HYPERLIPIDEMIA (06/03/2009), LEG PAIN, RIGHT (03/18/2010), Osteopenia (06/2018), PAF (paroxysmal atrial fibrillation) ([place]) (09/04/2011), POSTURAL LIGHTHEADEDNESS (06/17/2010), Vertigo, and VITAMIN D DEFICIENCY (06/03/2009).  Vitamin D deficiency Last vitamin D Lab Results  Component Value Date   VD25OH 49.56 01/05/2022   Stable, cont oral replacement   Smoker Pt counsled to quit, pt not ready  Hyperlipidemia Lab Results  Component Value Date   LDLCALC 80 01/05/2022   Stable, pt to continue current statin crestor   Hyperglycemia Lab Results  Component Value Date   HGBA1C 5.7 01/05/2022   Stable, pt to continue current medical treatment  - diet   Encounter for well adult exam with abnormal findings Age and sex appropriate education and counseling updated with regular exercise and diet Referrals for preventative services - none needed Immunizations addressed - declines covid booster, for prevnar 20 Smoking counseling  - pt counseled to quit, pt not ready Evidence  for  depression or other mood disorder - none significant Most recent labs reviewed. I have personally reviewed and have noted: 1) the patient's medical and social history 2) The patient's current medications and supplements 3) The patient's height, weight, and BMI have been recorded in the chart   COPD (chronic obstructive pulmonary disease) ([facility]) Stable, cont inhaler prn  Followup: Return in about 1 year (around 01/17/2023).  Cathlean Cower, MD 01/17/2022 7:56 PM [facility]

## 2022-01-17 NOTE — Assessment & Plan Note (Signed)
Lab Results  Component Value Date   HGBA1C 5.7 01/05/2022   Stable, pt to continue current medical treatment  - diet

## 2022-02-03 ENCOUNTER — Other Ambulatory Visit: Payer: Self-pay | Admitting: Internal Medicine

## 2022-02-03 NOTE — Telephone Encounter (Signed)
Please refill as per office routine med refill policy (all routine meds to be refilled for 3 mo or monthly (per pt preference) up to one year from last visit, then month to month grace period for 3 mo, then further med refills will have to be denied) ? ?

## 2022-05-13 DIAGNOSIS — L814 Other melanin hyperpigmentation: Secondary | ICD-10-CM | POA: Diagnosis not present

## 2022-05-13 DIAGNOSIS — Z85828 Personal history of other malignant neoplasm of skin: Secondary | ICD-10-CM | POA: Diagnosis not present

## 2022-05-13 DIAGNOSIS — L570 Actinic keratosis: Secondary | ICD-10-CM | POA: Diagnosis not present

## 2022-05-13 DIAGNOSIS — Z86018 Personal history of other benign neoplasm: Secondary | ICD-10-CM | POA: Diagnosis not present

## 2022-05-13 DIAGNOSIS — L820 Inflamed seborrheic keratosis: Secondary | ICD-10-CM | POA: Diagnosis not present

## 2022-05-13 DIAGNOSIS — D225 Melanocytic nevi of trunk: Secondary | ICD-10-CM | POA: Diagnosis not present

## 2022-05-13 DIAGNOSIS — Z808 Family history of malignant neoplasm of other organs or systems: Secondary | ICD-10-CM | POA: Diagnosis not present

## 2022-05-13 DIAGNOSIS — L821 Other seborrheic keratosis: Secondary | ICD-10-CM | POA: Diagnosis not present

## 2022-05-21 ENCOUNTER — Emergency Department: Payer: Medicare HMO

## 2022-05-21 ENCOUNTER — Emergency Department
Admission: EM | Admit: 2022-05-21 | Discharge: 2022-05-21 | Disposition: A | Discharge status: Home / Self Care | Payer: Medicare HMO | Attending: Emergency Medicine | Admitting: Emergency Medicine

## 2022-05-21 DIAGNOSIS — N132 Hydronephrosis with renal and ureteral calculous obstruction: Secondary | ICD-10-CM | POA: Diagnosis not present

## 2022-05-21 DIAGNOSIS — K828 Other specified diseases of gallbladder: Secondary | ICD-10-CM | POA: Diagnosis not present

## 2022-05-21 DIAGNOSIS — J449 Chronic obstructive pulmonary disease, unspecified: Secondary | ICD-10-CM | POA: Insufficient documentation

## 2022-05-21 DIAGNOSIS — R300 Dysuria: Secondary | ICD-10-CM | POA: Insufficient documentation

## 2022-05-21 DIAGNOSIS — K219 Gastro-esophageal reflux disease without esophagitis: Secondary | ICD-10-CM | POA: Diagnosis not present

## 2022-05-21 DIAGNOSIS — N200 Calculus of kidney: Secondary | ICD-10-CM | POA: Diagnosis not present

## 2022-05-21 DIAGNOSIS — R339 Retention of urine, unspecified: Secondary | ICD-10-CM | POA: Diagnosis not present

## 2022-05-21 DIAGNOSIS — R109 Unspecified abdominal pain: Secondary | ICD-10-CM | POA: Diagnosis not present

## 2022-05-21 LAB — URINALYSIS, ROUTINE W REFLEX MICROSCOPIC
Bilirubin Urine: NEGATIVE
Glucose, UA: NEGATIVE mg/dL
Ketones, ur: NEGATIVE mg/dL
Leukocytes,Ua: NEGATIVE
Nitrite: NEGATIVE
Protein, ur: NEGATIVE mg/dL
RBC / HPF: >50 RBC/hpf — ABNORMAL HIGH (ref 0–5)
Specific Gravity, Urine: 1.013 (ref 1.005–1.030)
pH: 6 (ref 5.0–8.0)

## 2022-05-21 LAB — COMPREHENSIVE METABOLIC PANEL
ALT: 28 U/L (ref 0–44)
AST: 30 U/L (ref 15–41)
Albumin: 4.1 g/dL (ref 3.5–5.0)
Alkaline Phosphatase: 82 U/L (ref 38–126)
Anion gap: 8 (ref 5–15)
BUN: 13 mg/dL (ref 8–23)
CO2: 24 mmol/L (ref 22–32)
Calcium: 9.8 mg/dL (ref 8.9–10.3)
Chloride: 109 mmol/L (ref 98–111)
Creatinine, Ser: 0.88 mg/dL (ref 0.44–1.00)
GFR, Estimated: >60 mL/min (ref >60)
Glucose, Bld: 133 mg/dL — ABNORMAL HIGH (ref 70–99)
Potassium: 4.4 mmol/L (ref 3.5–5.1)
Sodium: 141 mmol/L (ref 135–145)
Total Bilirubin: 1 mg/dL (ref 0.3–1.2)
Total Protein: 7.4 g/dL (ref 6.5–8.1)

## 2022-05-21 LAB — CBC
HCT: 47.4 % — ABNORMAL HIGH (ref 36.0–46.0)
Hemoglobin: 15.8 g/dL — ABNORMAL HIGH (ref 12.0–15.0)
MCH: 32 pg (ref 26.0–34.0)
MCHC: 33.3 g/dL (ref 30.0–36.0)
MCV: 96 fL (ref 80.0–100.0)
Platelets: 204 10*3/uL (ref 150–400)
RBC: 4.94 MIL/uL (ref 3.87–5.11)
RDW: 12.3 % (ref 11.5–15.5)
WBC: 9.1 10*3/uL (ref 4.0–10.5)
nRBC: 0 % (ref 0.0–0.2)

## 2022-05-21 LAB — LIPASE, BLOOD: Lipase: 24 U/L (ref 11–51)

## 2022-05-21 MED ORDER — TAMSULOSIN HCL 0.4 MG PO CAPS: 0.4000 mg | ORAL_CAPSULE | Freq: Every day | ORAL | 0 refills | Status: DC

## 2022-05-21 MED ORDER — KETOROLAC TROMETHAMINE 30 MG/ML IJ SOLN
30.0000 mg | Freq: Once | INTRAMUSCULAR | Status: AC
  Administered 2022-05-21: 30 mg via INTRAVENOUS
  Filled 2022-05-21: qty 1

## 2022-05-21 MED ORDER — HYDROCODONE-ACETAMINOPHEN 5-325 MG PO TABS
1.0000 | ORAL_TABLET | Freq: Three times a day (TID) | ORAL | 0 refills | Status: DC | PRN
Start: 2022-05-21 — End: 2022-05-23

## 2022-05-21 NOTE — Discharge Instructions (Addendum)
You were seen today for left flank pain.  Your CT scan showed evidence of a kidney stone.  I am putting you on medication to help you pass this kidney stone as well as pain medication for the pain.  Please try to drink lots of water throughout the rest of the weekend and call urology on Monday to schedule a follow-up appointment.

## 2022-05-21 NOTE — ED Provider Notes (Addendum)
[facility] Provider Note    Event Date/Time   First MD Initiated Contact with Patient 05/21/22 0710     (approximate)   History   Flank Pain, Urinary Retention, and Nausea   HPI  Brittany Burton is a 66 y.o. female with a history of GERD, COPD and chronic back pain presents to the ER today with complaint of left flank pain and vomiting.  She reports she had fried peppers and onions last night.  Shortly after that she started vomiting.  Then she developed left flank pain.  She describes the pain as sharp and shooting.  The pain does not radiate.  She denies nausea, abdominal pain, constipation, diarrhea or blood in her stool.  She is concerned because she has not had a BM since 8:30 PM last night.  She reports she typically only has 1 BM daily in the morning and has not had a BM this morning.  She denies the urge to defecate.  She denies urinary urgency, frequency, dysuria or blood in her urine.  She denies vaginal discharge, irritation, itching, odor or abnormal vaginal bleeding.  She has not tried anything OTC for the symptoms.      Physical Exam   Triage Vital Signs: ED Triage Vitals  Enc Vitals Group     BP 05/21/22 0645 (!) 158/78     Pulse Rate 05/21/22 0645 73     Resp 05/21/22 0645 17     Temp 05/21/22 0645 97.8 F (36.6 C)     Temp src --      SpO2 05/21/22 0645 95 %     Weight --      Height --      Head Circumference --      Peak Flow --      Pain Score 05/21/22 0646 7     Pain Loc --      Pain Edu? --      Excl. in [place]? --     Most recent vital signs: Vitals:   05/21/22 0645  BP: (!) 158/78  Pulse: 73  Resp: 17  Temp: 97.8 F (36.6 C)  SpO2: 95%     General: Awake, appears uncomfortable but in NAD. CV:  RRR,. Resp:  CTA bilaterally Abd:  Soft, active bowel sounds.  Nontender to palpation.  No mass noted.  + CVA tenderness noted on the left. MSK:  No bony tenderness noted over the spine.  Normal range of motion.  No  difficulty with gait.    ED Results / Procedures / Treatments   Labs  Labs Reviewed  COMPREHENSIVE METABOLIC PANEL - Abnormal; Notable for the following components:      Result Value   Glucose, Bld 133 (*)    All other components within normal limits  CBC - Abnormal; Notable for the following components:   Hemoglobin 15.8 (*)    HCT 47.4 (*)    All other components within normal limits  URINALYSIS, ROUTINE W REFLEX MICROSCOPIC - Abnormal; Notable for the following components:   Color, Urine YELLOW (*)    APPearance HAZY (*)    Hgb urine dipstick LARGE (*)    RBC / HPF >50 (*)    Bacteria, UA RARE (*)    All other components within normal limits  URINE CULTURE  LIPASE, BLOOD     RADIOLOGY  Imaging Orders         CT Renal Stone Study    IMPRESSION:  1. Partially obstructing  2 mm calculus in the proximal left ureter with associated mild hydronephrosis and perinephric soft tissue stranding.  2. No other urinary tract calculi identified.  3. Stable incidental findings including a calcified uterine fibroid and mild Aortic Atherosclerosis (ICD10-I70.0).    MEDICATIONS ORDERED IN ED: Medications  ketorolac (TORADOL) 30 MG/ML injection 30 mg (30 mg Intravenous Given 05/21/22 0727)     IMPRESSION / MDM / ASSESSMENT AND PLAN / ED COURSE  I reviewed the triage vital signs and the nursing notes.  Left Flank Pain, Vomiting:  Differential diagnosis includes, but is not limited to, UTI, kidney stone, acute pyelonephritis, food poisoning, diverticulitis  Patient's presentation is most consistent with acute complicated illness / injury requiring diagnostic workup.  CBC does not show any evidence of leukocytosis.  It does show evidence of polycythemia however she is a smoker with known COPD which is likely the cause. CMET does not show any evidence of electrolyte abnormality, kidney impairment or elevated liver enzymes. Lipase unremarkable Urinalysis shows large hemoglobin, rare  bacteria which would be more consistent with a kidney stone Urine culture pending Toradol 30 mg IV x1 with good relief of pain CT renal stone study shows a small stone in the left ureter per my interpretation, confirmed by radiology Discussed findings with patient, she is agreeable to treatment, discharge Rx for Flomax 0.4 mg daily x7 days Rx for Hydrocodone 5-325 mg p.o. every 8 hours She will follow-up with urology as an outpatient.   FINAL CLINICAL IMPRESSION(S) / ED DIAGNOSES   Final diagnoses:  Kidney stone     Rx / DC Orders   ED Discharge Orders          Ordered    HYDROcodone-acetaminophen (NORCO/VICODIN) 5-325 MG tablet  Every 8 hours PRN        05/21/22 0922    tamsulosin (FLOMAX) 0.4 MG CAPS capsule  Daily        05/21/22 3299             Note:  This document was prepared using Dragon voice recognition software and may include unintentional dictation errors.    Jearld Fenton, NP 05/21/22 2426    Jearld Fenton, NP 05/21/22 8341    Naaman Plummer, MD 05/21/22 1003

## 2022-05-21 NOTE — ED Notes (Signed)
Pt to ED for sudden onset L flank pain since last ngiht. Was vomiting last night. Pt unable to stay still in bed. Pt states she may be dehydrated. Provider aware, no new orders for IVF at this time. No hx renal stones. Denies dysuria. LBM yesterday morning.

## 2022-05-21 NOTE — ED Triage Notes (Signed)
Pt states she has been having left sided flank pain, N/V and urinary retention since 2300 yesterday.

## 2022-05-21 NOTE — ED Notes (Signed)
Pt to CT

## 2022-05-22 LAB — URINE CULTURE: Culture: NO GROWTH

## 2022-05-23 ENCOUNTER — Encounter: Payer: Self-pay | Admitting: Internal Medicine

## 2022-05-23 MED ORDER — HYDROCODONE-ACETAMINOPHEN 5-325 MG PO TABS: 1.0000 | ORAL_TABLET | Freq: Three times a day (TID) | ORAL | 0 refills | Status: DC | PRN

## 2022-05-23 NOTE — Telephone Encounter (Signed)
Need to clarify the strength of the hydrocodone please

## 2022-05-25 ENCOUNTER — Encounter: Payer: Self-pay | Admitting: Urology

## 2022-05-25 ENCOUNTER — Ambulatory Visit
Admission: RE | Admit: 2022-05-25 | Discharge: 2022-05-25 | Disposition: A | Discharge status: Home / Self Care | Payer: Medicare HMO | Source: Ambulatory Visit | Attending: Internal Medicine | Admitting: Internal Medicine

## 2022-05-25 ENCOUNTER — Ambulatory Visit: Payer: Medicare HMO | Admitting: Urology

## 2022-05-25 VITALS — BP 116/73 | HR 87 | Ht 65.0 in | Wt 173.0 lb

## 2022-05-25 DIAGNOSIS — F17210 Nicotine dependence, cigarettes, uncomplicated: Secondary | ICD-10-CM | POA: Diagnosis not present

## 2022-05-25 DIAGNOSIS — N201 Calculus of ureter: Secondary | ICD-10-CM | POA: Diagnosis not present

## 2022-05-25 DIAGNOSIS — N200 Calculus of kidney: Secondary | ICD-10-CM

## 2022-05-25 DIAGNOSIS — R69 Illness, unspecified: Secondary | ICD-10-CM | POA: Diagnosis not present

## 2022-05-25 DIAGNOSIS — Z87891 Personal history of nicotine dependence: Secondary | ICD-10-CM

## 2022-05-25 MED ORDER — TAMSULOSIN HCL 0.4 MG PO CAPS
0.4000 mg | ORAL_CAPSULE | Freq: Every day | ORAL | 0 refills | Status: DC
Start: 2022-05-25 — End: 2022-06-29

## 2022-05-25 NOTE — Progress Notes (Signed)
05/25/22 3:03 PM   RICKITA FORSTNER July 24, 1956 [id number]  CC: Left ureteral stone  HPI: 66 year old female who presented to the ER on 66/11/2021 with left-sided back pain and CT showed a 2 mm left proximal ureteral stone with hydronephrosis.  Urinalysis was noninfected and she was discharged with medical expulsive therapy.  She has been taking Flomax, NSAIDs, narcotics over the last few days, but feels like her pain has improved over the last 24 hours.  She denies any fevers, chills, or urinary symptoms.  She denies any prior history of kidney stones.   PMH: Past Medical History:  Diagnosis Date   BACK PAIN 03/18/2010   COPD (chronic obstructive pulmonary disease) ([place]) 05/18/2017   GERD 06/03/2009   HYPERLIPIDEMIA 06/03/2009   Kidney stone    LEG PAIN, RIGHT 03/18/2010   Osteopenia 06/2018   T score -1.8 FRAX 9.4% / 1.8%   PAF (paroxysmal atrial fibrillation) ([place]) 09/04/2011   POSTURAL LIGHTHEADEDNESS 06/17/2010   Vertigo    chronic recurrent, BPV, probl left related   VITAMIN D DEFICIENCY 06/03/2009     Family History: Family History  Problem Relation Age of Onset   Melanoma Father    Esophageal cancer Father    Ovarian cancer Sister 41       stage 67   Melanoma Sister    Cancer Paternal Grandmother        colon late 70's   Colon cancer Paternal Grandmother    Pancreatic cancer Neg Hx    Rectal cancer Neg Hx    Stomach cancer Neg Hx     Social History:  reports that she has been smoking cigarettes. She has a 75.25 pack-year smoking history. She has never used smokeless tobacco. She reports that she does not drink alcohol and does not use drugs.  Physical Exam: BP 116/73 (BP Location: Left Arm, Patient Position: Sitting, Cuff Size: Normal)   Pulse 87   Ht '5\' 5"'$  (1.651 m)   Wt 173 lb (78.5 kg)   BMI 28.79 kg/m    Constitutional:  Alert and oriented, No acute distress. Cardiovascular: No clubbing, cyanosis, or edema. Respiratory: Normal respiratory effort,  no increased work of breathing. GI: Abdomen is soft, nontender, nondistended, no abdominal masses   Laboratory Data: Reviewed in epic  Pertinent Imaging: I have personally viewed and interpreted the CT scan showing a 66m left proximal ureteral stone with hydronephrosis, no other renal stones  Assessment & Plan:   66year old female with 2 mm left proximal ureteral stone, no clinical or laboratory evidence of infection, pain improved over the last 24 hours  We discussed various treatment options for urolithiasis including observation with or without medical expulsive therapy, shockwave lithotripsy (SWL), ureteroscopy and laser lithotripsy with stent placement, and percutaneous nephrolithotomy.  We discussed that management is based on stone size, location, density, patient co-morbidities, and patient preference.   Stones <530min size have a >80% spontaneous passage rate. 66ta surrounding the use of tamsulosin for medical expulsive therapy is controversial, but meta analyses suggests it is most efficacious for distal stones between 66-1023mn size. Possible side effects include dizziness/lightheadedness, and retrograde ejaculation.  SWL has a lower stone free rate in a single procedure, but also a lower complication rate compared to ureteroscopy and avoids a stent and associated stent related symptoms. Possible complications include renal hematoma, steinstrasse, and need for additional treatment.  We discussed with the size of her stone this may be very challenging to see with fluoroscopy.  Ureteroscopy with  laser lithotripsy and stent placement has a higher stone free rate than SWL in a single procedure, however increased complication rate including possible infection, ureteral injury, bleeding, and stent related morbidity. Common stent related symptoms include dysuria, urgency/frequency, and flank pain.  We discussed general stone prevention strategies including adequate hydration with goal of  producing 66.5 L of urine daily, increasing citric acid intake, increasing calcium intake during high oxalate meals, minimizing animal protein, and decreasing salt intake. Information about dietary recommendations given today.    After an extensive discussion of the risks and benefits of the above treatment options, using shared decision making the patient would like to proceed with medical expulsive therapy.  Flomax was refilled, encouraged use of NSAIDs for any recurrent pain, return precautions discussed including uncontrolled pain or fever.  RTC 2 weeks PA symptom check, consider KUB or further imaging if persistent symptoms   Nickolas Madrid, MD 05/25/2022  [facility] [address] [place], [place] [address] [phone]

## 2022-05-25 NOTE — Patient Instructions (Signed)
Kidney Stones  Kidney stones are solid, rock-like deposits that form inside of the kidneys. The kidneys are a pair of organs that make urine. A kidney stone may form in a kidney and move into other parts of the urinary tract, including the tubes that connect the kidneys to the bladder (ureters), the bladder, and the tube that carries urine out of the body (urethra). As the stone moves through these areas, it can cause intense pain and block the flow of urine. Kidney stones are created when high levels of certain minerals are found in the urine. The stones are usually passed out of the body through urination, but in some cases, medical treatment may be needed to remove them. What are the causes? Kidney stones may be caused by: A condition in which certain glands produce too much parathyroid hormone (primary hyperparathyroidism), which causes too much calcium buildup in the blood. A buildup of uric acid crystals in the bladder (hyperuricosuria). Uric acid is a chemical that the body produces when you eat certain foods. It usually exits the body in the urine. Narrowing (stricture) of one or both of the ureters. A kidney blockage that is present at birth (congenital obstruction). Past surgery on the kidney or the ureters, such as gastric bypass surgery. What increases the risk? The following factors may make you more likely to develop this condition: Having had a kidney stone in the past. Having a family history of kidney stones. Not drinking enough water. Eating a diet that is high in protein, salt (sodium), or sugar. Being overweight or obese. What are the signs or symptoms? Symptoms of a kidney stone may include: Pain in the side of the abdomen, right below the ribs (flank pain). Pain usually spreads (radiates) to the groin. Needing to urinate frequently or urgently. Painful urination. Blood in the urine (hematuria). Nausea. Vomiting. Fever and chills. How is this diagnosed? This condition  may be diagnosed based on: Your symptoms and medical history. A physical exam. Blood tests. Urine tests. These may be done before and after the stone passes out of your body through urination. Imaging tests, such as a CT scan, abdominal X-ray, or ultrasound. A procedure to examine the inside of the bladder (cystoscopy). How is this treated? Treatment for kidney stones depends on the size, location, and makeup of the stones. Kidney stones will often pass out of the body through urination. You may need to: Increase your fluid intake to help pass the stone. In some cases, you may be given fluids through an IV and may need to be monitored at the hospital. Take medicine for pain. Make changes in your diet to help prevent kidney stones from coming back. Sometimes, medical procedures are needed to remove a kidney stone. This may involve: A procedure to break up kidney stones using: A focused beam of light (laser therapy). Shock waves (extracorporeal shock wave lithotripsy). Surgery to remove kidney stones. This may be needed if you have severe pain or have stones that block your urinary tract. Follow these instructions at home: Medicines Take over-the-counter and prescription medicines only as told by your health care provider. Ask your health care provider if the medicine prescribed to you requires you to avoid driving or using heavy machinery. Eating and drinking Drink enough fluid to keep your urine pale yellow. You may be instructed to drink at least 8-10 glasses of water each day. This will help you pass the kidney stone. If directed, change your diet. This may include: Limiting how much  sodium you eat. Eating more fruits and vegetables. Limiting how much animal protein--such as red meat, poultry, fish, and eggs--you eat. Follow instructions from your health care provider about eating or drinking restrictions. General instructions Collect urine samples as told by your health care provider.  You may need to collect a urine sample: 24 hours after you pass the stone. 8-12 weeks after passing the kidney stone, and every 6-12 months after that. Strain your urine every time you urinate, for as long as directed. Use the strainer that your health care provider recommends. Do not throw out the kidney stone after passing it. Keep the stone so it can be tested by your health care provider. Testing the makeup of your kidney stone may help prevent you from getting kidney stones in the future. Keep all follow-up visits as told by your health care provider. This is important. You may need follow-up X-rays or ultrasounds to make sure that your stone has passed. How is this prevented? To prevent another kidney stone: Drink enough fluid to keep your urine pale yellow. This is the best way to prevent kidney stones. Eat a healthy diet and follow recommendations from your health care provider about foods to avoid. You may be instructed to eat a low-protein diet. Recommendations vary depending on the type of kidney stone that you have. Maintain a healthy weight. Where to find more information [place] [facility]): www.kidney.[facility]): www.urologyhealth.org Contact a health care provider if: You have pain that gets worse or does not get better with medicine. Get help right away if: You have a fever or chills. You develop severe pain. You develop new abdominal pain. You faint. You are unable to urinate. Summary Kidney stones are solid, rock-like deposits that form inside of the kidneys. Kidney stones can cause nausea, vomiting, blood in the urine, abdominal pain, and the urge to urinate frequently. Treatment for kidney stones depends on the size, location, and makeup of the stones. Kidney stones will often pass out of the body through urination. Kidney stones can be prevented by drinking enough fluids, eating a healthy diet, and maintaining a healthy weight. This  information is not intended to replace advice given to you by your health care provider. Make sure you discuss any questions you have with your health care provider. Document Revised: 07/28/2021 Document Reviewed: 07/12/2021 Elsevier Patient Education  [place].  Dietary Guidelines to Help Prevent Kidney Stones Kidney stones are deposits of minerals and salts that form inside your kidneys. Your risk of developing kidney stones may be greater depending on your diet, your lifestyle, the medicines you take, and whether you have certain medical conditions. Most people can lower their chances of developing kidney stones by following the instructions below. Your dietitian may give you more specific instructions depending on your overall health and the type of kidney stones you tend to develop. What are tips for following this plan? Reading food labels  Choose foods with "no salt added" or "low-salt" labels. Limit your salt (sodium) intake to less than 1,500 mg a day. Choose foods with calcium for each meal and snack. Try to eat about 300 mg of calcium at each meal. Foods that contain 200-500 mg of calcium a serving include: 8 oz (237 mL) of milk, calcium-fortifiednon-dairy milk, and calcium-fortifiedfruit juice. Calcium-fortified means that calcium has been added to these drinks. 8 oz (237 mL) of kefir, yogurt, and soy yogurt. 4 oz (114 g) of tofu. 1 oz (28 g) of  cheese. 1 cup (150 g) of dried figs. 1 cup (91 g) of cooked broccoli. One 3 oz (85 g) can of sardines or mackerel. Most people need 1,000-1,500 mg of calcium a day. Talk to your dietitian about how much calcium is recommended for you. Shopping Buy plenty of fresh fruits and vegetables. Most people do not need to avoid fruits and vegetables, even if these foods contain nutrients that may contribute to kidney stones. When shopping for convenience foods, choose: Whole pieces of fruit. Pre-made salads with dressing on the  side. Low-fat fruit and yogurt smoothies. Avoid buying frozen meals or prepared deli foods. These can be high in sodium. Look for foods with live cultures, such as yogurt and kefir. Choose high-fiber grains, such as whole-wheat breads, oat bran, and wheat cereals. Cooking Do not add salt to food when cooking. Place a salt shaker on the table and allow each person to add his or her own salt to taste. Use vegetable protein, such as beans, textured vegetable protein (TVP), or tofu, instead of meat in pasta, casseroles, and soups. Meal planning Eat less salt, if told by your dietitian. To do this: Avoid eating processed or pre-made food. Avoid eating fast food. Eat less animal protein, including cheese, meat, poultry, or fish, if told by your dietitian. To do this: Limit the number of times you have meat, poultry, fish, or cheese each week. Eat a diet free of meat at least 2 days a week. Eat only one serving each day of meat, poultry, fish, or seafood. When you prepare animal protein, cut pieces into small portion sizes. For most meat and fish, one serving is about the size of the palm of your hand. Eat at least five servings of fresh fruits and vegetables each day. To do this: Keep fruits and vegetables on hand for snacks. Eat one piece of fruit or a handful of berries with breakfast. Have a salad and fruit at lunch. Have two kinds of vegetables at dinner. Limit foods that are high in a substance called oxalate. These include: Spinach (cooked), rhubarb, beets, sweet potatoes, and Swiss chard. Peanuts. Potato chips, french fries, and baked potatoes with skin on. Nuts and nut products. Chocolate. If you regularly take a diuretic medicine, make sure to eat at least 1 or 2 servings of fruits or vegetables that are high in potassium each day. These include: Avocado. Banana. Orange, prune, carrot, or tomato juice. Baked potato. Cabbage. Beans and split peas. Lifestyle  Drink enough fluid  to keep your urine pale yellow. This is the most important thing you can do. Spread your fluid intake throughout the day. If you drink alcohol: Limit how much you use to: 0-1 drink a day for women who are not pregnant. 0-2 drinks a day for men. Be aware of how much alcohol is in your drink. In the [place] one drink equals one 12 oz bottle of beer (355 mL), one 5 oz glass of wine (148 mL), or one 1 oz glass of hard liquor (44 mL). Lose weight if told by your health care provider. Work with your dietitian to find an eating plan and weight loss strategies that work best for you. General information Talk to your health care provider and dietitian about taking daily supplements. You may be told the following depending on your health and the cause of your kidney stones: Not to take supplements with vitamin C. To take a calcium supplement. To take a daily probiotic supplement. To take other supplements such  as magnesium, fish oil, or vitamin B6. Take over-the-counter and prescription medicines only as told by your health care provider. These include supplements. What foods should I limit? Limit your intake of the following foods, or eat them as told by your dietitian. Vegetables Spinach. Rhubarb. Beets. Canned vegetables. Angie Fava. Olives. Baked potatoes with skin. Grains Wheat bran. Baked goods. Salted crackers. Cereals high in sugar. Meats and other proteins Nuts. Nut butters. Large portions of meat, poultry, or fish. Salted, precooked, or cured meats, such as sausages, meat loaves, and hot dogs. Dairy Cheese. Beverages Regular soft drinks. Regular vegetable juice. Seasonings and condiments Seasoning blends with salt. Salad dressings. Soy sauce. Ketchup. Barbecue sauce. Other foods Canned soups. Canned pasta sauce. Casseroles. Pizza. Lasagna. Frozen meals. Potato chips. [place] fries. The items listed above may not be a complete list of foods and beverages you should limit. Contact a dietitian  for more information. What foods should I avoid? Talk to your dietitian about specific foods you should avoid based on the type of kidney stones you have and your overall health. Fruits Grapefruit. The item listed above may not be a complete list of foods and beverages you should avoid. Contact a dietitian for more information. Summary Kidney stones are deposits of minerals and salts that form inside your kidneys. You can lower your risk of kidney stones by making changes to your diet. The most important thing you can do is drink enough fluid. Drink enough fluid to keep your urine pale yellow. Talk to your dietitian about how much calcium you should have each day, and eat less salt and animal protein as told by your dietitian. This information is not intended to replace advice given to you by your health care provider. Make sure you discuss any questions you have with your health care provider. Document Revised: 07/19/2021 Document Reviewed: 07/19/2021 Elsevier Patient Education  [place].

## 2022-05-26 ENCOUNTER — Other Ambulatory Visit: Payer: Self-pay | Admitting: Acute Care

## 2022-05-26 DIAGNOSIS — F17210 Nicotine dependence, cigarettes, uncomplicated: Secondary | ICD-10-CM

## 2022-05-26 DIAGNOSIS — Z122 Encounter for screening for malignant neoplasm of respiratory organs: Secondary | ICD-10-CM

## 2022-05-26 DIAGNOSIS — Z87891 Personal history of nicotine dependence: Secondary | ICD-10-CM

## 2022-06-09 ENCOUNTER — Ambulatory Visit: Payer: Medicare HMO | Admitting: Physician Assistant

## 2022-06-21 ENCOUNTER — Encounter: Payer: Self-pay | Admitting: Internal Medicine

## 2022-06-21 DIAGNOSIS — R300 Dysuria: Secondary | ICD-10-CM

## 2022-06-22 ENCOUNTER — Other Ambulatory Visit (INDEPENDENT_AMBULATORY_CARE_PROVIDER_SITE_OTHER): Payer: Medicare HMO

## 2022-06-22 ENCOUNTER — Other Ambulatory Visit: Payer: Self-pay | Admitting: Internal Medicine

## 2022-06-22 DIAGNOSIS — H5213 Myopia, bilateral: Secondary | ICD-10-CM | POA: Diagnosis not present

## 2022-06-22 DIAGNOSIS — R300 Dysuria: Secondary | ICD-10-CM | POA: Diagnosis not present

## 2022-06-22 LAB — URINALYSIS, ROUTINE W REFLEX MICROSCOPIC
Bilirubin Urine: NEGATIVE
Ketones, ur: NEGATIVE
Nitrite: NEGATIVE
Specific Gravity, Urine: 1.025 (ref 1.000–1.030)
Total Protein, Urine: 100 — AB
Urine Glucose: NEGATIVE
Urobilinogen, UA: 0.2 (ref 0.0–1.0)
pH: 6 (ref 5.0–8.0)

## 2022-06-22 MED ORDER — CIPROFLOXACIN HCL 500 MG PO TABS: 500.0000 mg | ORAL_TABLET | Freq: Two times a day (BID) | ORAL | 0 refills | Status: AC

## 2022-06-23 LAB — URINE CULTURE
MICRO NUMBER:: 13726341
SPECIMEN QUALITY:: ADEQUATE

## 2022-06-24 ENCOUNTER — Encounter: Payer: Self-pay | Admitting: Internal Medicine

## 2022-06-24 NOTE — Addendum Note (Signed)
Addended by: Biagio Borg on: 06/24/2022 04:44 PM   Modules accepted: Orders

## 2022-06-27 NOTE — Telephone Encounter (Signed)
Ok good to know   thanks

## 2022-06-28 ENCOUNTER — Other Ambulatory Visit: Payer: Self-pay

## 2022-06-28 DIAGNOSIS — R300 Dysuria: Secondary | ICD-10-CM

## 2022-06-29 ENCOUNTER — Encounter: Payer: Self-pay | Admitting: Urology

## 2022-06-29 ENCOUNTER — Ambulatory Visit
Admission: RE | Admit: 2022-06-29 | Discharge: 2022-06-29 | Disposition: A | Discharge status: Home / Self Care | Payer: Medicare HMO | Attending: Urology | Admitting: Urology

## 2022-06-29 ENCOUNTER — Ambulatory Visit
Admission: RE | Admit: 2022-06-29 | Discharge: 2022-06-29 | Disposition: A | Discharge status: Home / Self Care | Payer: Medicare HMO | Source: Ambulatory Visit | Attending: Urology | Admitting: Urology

## 2022-06-29 ENCOUNTER — Inpatient Hospital Stay: Admit: 2022-06-29 | Payer: Medicare HMO

## 2022-06-29 ENCOUNTER — Other Ambulatory Visit
Admission: RE | Admit: 2022-06-29 | Discharge: 2022-06-29 | Disposition: A | Discharge status: Home / Self Care | Payer: Medicare HMO | Source: Home / Self Care | Attending: Urology | Admitting: Urology

## 2022-06-29 ENCOUNTER — Ambulatory Visit: Payer: Medicare HMO | Admitting: Urology

## 2022-06-29 VITALS — BP 116/79 | HR 83 | Ht 65.0 in | Wt 175.0 lb

## 2022-06-29 DIAGNOSIS — R350 Frequency of micturition: Secondary | ICD-10-CM | POA: Diagnosis not present

## 2022-06-29 DIAGNOSIS — R300 Dysuria: Secondary | ICD-10-CM

## 2022-06-29 DIAGNOSIS — R399 Unspecified symptoms and signs involving the genitourinary system: Secondary | ICD-10-CM

## 2022-06-29 DIAGNOSIS — N200 Calculus of kidney: Secondary | ICD-10-CM

## 2022-06-29 DIAGNOSIS — D259 Leiomyoma of uterus, unspecified: Secondary | ICD-10-CM | POA: Diagnosis not present

## 2022-06-29 DIAGNOSIS — I878 Other specified disorders of veins: Secondary | ICD-10-CM | POA: Diagnosis not present

## 2022-06-29 DIAGNOSIS — R3915 Urgency of urination: Secondary | ICD-10-CM | POA: Diagnosis not present

## 2022-06-29 DIAGNOSIS — Z87442 Personal history of urinary calculi: Secondary | ICD-10-CM | POA: Diagnosis not present

## 2022-06-29 LAB — URINALYSIS, COMPLETE (UACMP) WITH MICROSCOPIC
Bilirubin Urine: NEGATIVE
Glucose, UA: NEGATIVE mg/dL
Hgb urine dipstick: NEGATIVE
Ketones, ur: NEGATIVE mg/dL
Leukocytes,Ua: NEGATIVE
Nitrite: NEGATIVE
Protein, ur: NEGATIVE mg/dL
Specific Gravity, Urine: 1.025 (ref 1.005–1.030)
pH: 5 (ref 5.0–8.0)

## 2022-06-29 MED ORDER — SULFAMETHOXAZOLE-TRIMETHOPRIM 800-160 MG PO TABS: 1.0000 | ORAL_TABLET | Freq: Two times a day (BID) | ORAL | 0 refills | Status: DC

## 2022-06-29 NOTE — Progress Notes (Signed)
   06/29/2022 1:58 PM   Brittany Burton 1956/10/09 [id number]  Reason for visit: Follow up left ureteral stone, new urinary symptoms  HPI: 66 year old female who I saw on 05/25/2022 for left-sided flank pain with CT showing a 2 mm left proximal ureteral stone.  Her pain had resolved by the time I saw her in clinic and she felt like her stone had passed.  She did not follow-up a few weeks later as she was feeling better.  She reports about a week of pelvic pressure and some urgency and frequency.  She denies any dysuria, gross hematuria, or flank pain.  Urine culture was negative with PCP.  Urinalysis today with 6-10 squamous cells, 6-10 WBCs, many bacteria, 0-5 RBC, will send for culture.  I personally viewed and interpreted the KUB today that does not show any definitive evidence of stones, but small phleboliths in the pelvis that are stable from prior CT.  We discussed possible etiologies including migration of small proximal ureteral stone to the distal ureter, increase fluid intake resulting in urinary symptoms, overactive bladder, or UTI.  She opted for a short course of antibiotics  Bactrim DS twice daily x 3 days RTC 2 to 3 weeks symptom check Consider repeat CT if persistent symptoms  Billey Co, MD  [facility] [address] [place], [place] [address] [phone]

## 2022-06-29 NOTE — Progress Notes (Signed)
ub

## 2022-06-30 ENCOUNTER — Telehealth: Payer: Self-pay

## 2022-06-30 NOTE — Telephone Encounter (Signed)
Patient notified, symptoms are doing well at this time she will call back if symptoms worsen and she wishes to proceed with a CTscan.

## 2022-06-30 NOTE — Telephone Encounter (Signed)
-----   Message from Billey Co, MD sent at 06/30/2022 10:01 AM EDT ----- No stone seen on x-ray, recommend antibiotics as we discussed in clinic, but if worsening symptoms may need to consider repeat CT to evaluate for a very small kidney stone that cannot be seen on x-ray  Nickolas Madrid, MD 06/30/2022

## 2022-07-01 LAB — URINE CULTURE

## 2022-07-19 NOTE — Progress Notes (Deleted)
66 y.o. G0P0000 Married White or Caucasian Not Hispanic or Latino female here for annual exam.      No LMP recorded. Patient is postmenopausal.          Sexually active: {yes no:314532}  The current method of family planning is {contraception:315051}.    Exercising: {yes no:314532}  {types:19826} Smoker:  {YES [id number]  Health Maintenance: Pap: 07/14/21 WNL Hr Hpv Neg, 06/21/2018 WNL History of abnormal Pap:  no MMG:  08/09/21 bi-rads 1 neg  BMD:   07/28/21 osteopenia  Colonoscopy: 12/12/17 every 10 years, normal.  TDaP:  01/07/2019 Gardasil: Na    reports that she has been smoking cigarettes. She has a 75.25 pack-year smoking history. She has never used smokeless tobacco. She reports that she does not drink alcohol and does not use drugs.  Past Medical History:  Diagnosis Date   BACK PAIN 03/18/2010   COPD (chronic obstructive pulmonary disease) ([place]) 05/18/2017   GERD 06/03/2009   HYPERLIPIDEMIA 06/03/2009   Kidney stone    LEG PAIN, RIGHT 03/18/2010   Osteopenia 06/2018   T score -1.8 FRAX 9.4% / 1.8%   PAF (paroxysmal atrial fibrillation) ([place]) 09/04/2011   POSTURAL LIGHTHEADEDNESS 06/17/2010   Vertigo    chronic recurrent, BPV, probl left related   VITAMIN D DEFICIENCY 06/03/2009    No past surgical history on file.  Current Outpatient Medications  Medication Sig Dispense Refill   aspirin 81 MG EC tablet Take 1 tablet (81 mg total) by mouth daily. Swallow whole. 30 tablet 12   meclizine (ANTIVERT) 25 MG tablet Take 1 tablet (25 mg total) by mouth daily as needed. 90 tablet 3   rosuvastatin (CRESTOR) 20 MG tablet TAKE 1 TABLET BY MOUTH EVERY DAY 90 tablet 3   sulfamethoxazole-trimethoprim (BACTRIM DS) 800-160 MG tablet Take 1 tablet by mouth 2 (two) times daily. 6 tablet 0   No current facility-administered medications for this visit.    Family History  Problem Relation Age of Onset   Melanoma Father    Esophageal cancer Father    Ovarian cancer Sister 33        stage 46   Melanoma Sister    Cancer Paternal Grandmother        colon late 70's   Colon cancer Paternal Grandmother    Pancreatic cancer Neg Hx    Rectal cancer Neg Hx    Stomach cancer Neg Hx     Review of Systems  Exam:   There were no vitals taken for this visit.  Weight change: '@WEIGHTCHANGE'$ @ Height:      Ht Readings from Last 3 Encounters:  06/29/22 '5\' 5"'$  (1.651 m)  05/25/22 '5\' 5"'$  (1.651 m)  01/17/22 '5\' 5"'$  (1.651 m)    General appearance: alert, cooperative and appears stated age Head: Normocephalic, without obvious abnormality, atraumatic Neck: no adenopathy, supple, symmetrical, trachea midline and thyroid {CHL AMB PHY EX THYROID NORM [phone]::"normal to inspection and palpation"} Lungs: clear to auscultation bilaterally Cardiovascular: regular rate and rhythm Breasts: {Exam; breast:13139::"normal appearance, no masses or tenderness"} Abdomen: soft, non-tender; non distended,  no masses,  no organomegaly Extremities: extremities normal, atraumatic, no cyanosis or edema Skin: Skin color, texture, turgor normal. No rashes or lesions Lymph nodes: Cervical, supraclavicular, and axillary nodes normal. No abnormal inguinal nodes palpated Neurologic: Grossly normal   Pelvic: External genitalia:  no lesions              Urethra:  normal appearing urethra with no masses, tenderness or lesions  Bartholins and Skenes: normal                 Vagina: normal appearing vagina with normal color and discharge, no lesions              Cervix: {CHL AMB PHY EX CERVIX NORM DEFAULT:[phone]::"no lesions"}               Bimanual Exam:  Uterus:  {CHL AMB PHY EX UTERUS NORM DEFAULT:[phone]::"normal size, contour, position, consistency, mobility, non-tender"}              Adnexa: {CHL AMB PHY EX ADNEXA NO MASS [phone]::"no mass, fullness, tenderness"}               Rectovaginal: Confirms               Anus:  normal sphincter tone, no lesions  ***  chaperoned for the exam.  A:  Well Woman with normal exam  P:

## 2022-08-02 ENCOUNTER — Emergency Department: Payer: Medicare HMO

## 2022-08-02 ENCOUNTER — Observation Stay: Payer: Medicare HMO

## 2022-08-02 ENCOUNTER — Other Ambulatory Visit: Payer: Self-pay

## 2022-08-02 ENCOUNTER — Ambulatory Visit: Payer: Medicare Other | Admitting: Obstetrics and Gynecology

## 2022-08-02 ENCOUNTER — Observation Stay
Admission: EM | Admit: 2022-08-02 | Discharge: 2022-08-03 | Disposition: A | Discharge status: Home / Self Care | Payer: Medicare HMO | Attending: Internal Medicine | Admitting: Internal Medicine

## 2022-08-02 DIAGNOSIS — T68XXXA Hypothermia, initial encounter: Secondary | ICD-10-CM | POA: Diagnosis present

## 2022-08-02 DIAGNOSIS — I6522 Occlusion and stenosis of left carotid artery: Secondary | ICD-10-CM | POA: Diagnosis not present

## 2022-08-02 DIAGNOSIS — R531 Weakness: Secondary | ICD-10-CM | POA: Insufficient documentation

## 2022-08-02 DIAGNOSIS — F17210 Nicotine dependence, cigarettes, uncomplicated: Secondary | ICD-10-CM | POA: Insufficient documentation

## 2022-08-02 DIAGNOSIS — R680 Hypothermia, not associated with low environmental temperature: Secondary | ICD-10-CM | POA: Insufficient documentation

## 2022-08-02 DIAGNOSIS — I480 Paroxysmal atrial fibrillation: Secondary | ICD-10-CM | POA: Diagnosis not present

## 2022-08-02 DIAGNOSIS — E785 Hyperlipidemia, unspecified: Secondary | ICD-10-CM | POA: Diagnosis present

## 2022-08-02 DIAGNOSIS — Z7982 Long term (current) use of aspirin: Secondary | ICD-10-CM | POA: Diagnosis not present

## 2022-08-02 DIAGNOSIS — R29818 Other symptoms and signs involving the nervous system: Secondary | ICD-10-CM | POA: Diagnosis not present

## 2022-08-02 DIAGNOSIS — Z20822 Contact with and (suspected) exposure to covid-19: Secondary | ICD-10-CM | POA: Insufficient documentation

## 2022-08-02 DIAGNOSIS — R2990 Unspecified symptoms and signs involving the nervous system: Secondary | ICD-10-CM | POA: Diagnosis present

## 2022-08-02 DIAGNOSIS — J449 Chronic obstructive pulmonary disease, unspecified: Secondary | ICD-10-CM | POA: Diagnosis present

## 2022-08-02 DIAGNOSIS — Z79899 Other long term (current) drug therapy: Secondary | ICD-10-CM | POA: Diagnosis not present

## 2022-08-02 DIAGNOSIS — R69 Illness, unspecified: Secondary | ICD-10-CM | POA: Diagnosis not present

## 2022-08-02 DIAGNOSIS — R4781 Slurred speech: Secondary | ICD-10-CM | POA: Diagnosis present

## 2022-08-02 DIAGNOSIS — R42 Dizziness and giddiness: Secondary | ICD-10-CM

## 2022-08-02 DIAGNOSIS — H538 Other visual disturbances: Secondary | ICD-10-CM | POA: Diagnosis not present

## 2022-08-02 DIAGNOSIS — Z720 Tobacco use: Secondary | ICD-10-CM | POA: Diagnosis present

## 2022-08-02 LAB — DIFFERENTIAL
Abs Immature Granulocytes: 0.1 10*3/uL — ABNORMAL HIGH (ref 0.00–0.07)
Basophils Absolute: 0 10*3/uL (ref 0.0–0.1)
Basophils Relative: 1 %
Eosinophils Absolute: 0.1 10*3/uL (ref 0.0–0.5)
Eosinophils Relative: 1 %
Immature Granulocytes: 1 %
Lymphocytes Relative: 30 %
Lymphs Abs: 2.4 10*3/uL (ref 0.7–4.0)
Monocytes Absolute: 0.5 10*3/uL (ref 0.1–1.0)
Monocytes Relative: 7 %
Neutro Abs: 4.9 10*3/uL (ref 1.7–7.7)
Neutrophils Relative %: 60 %

## 2022-08-02 LAB — URINALYSIS, COMPLETE (UACMP) WITH MICROSCOPIC
Bacteria, UA: NONE SEEN
Bilirubin Urine: NEGATIVE
Glucose, UA: NEGATIVE mg/dL
Hgb urine dipstick: NEGATIVE
Ketones, ur: NEGATIVE mg/dL
Leukocytes,Ua: NEGATIVE
Nitrite: NEGATIVE
Protein, ur: NEGATIVE mg/dL
Specific Gravity, Urine: >1.046 — ABNORMAL HIGH (ref 1.005–1.030)
pH: 5 (ref 5.0–8.0)

## 2022-08-02 LAB — COMPREHENSIVE METABOLIC PANEL
ALT: 29 U/L (ref 0–44)
AST: 33 U/L (ref 15–41)
Albumin: 4.1 g/dL (ref 3.5–5.0)
Alkaline Phosphatase: 71 U/L (ref 38–126)
Anion gap: 10 (ref 5–15)
BUN: 18 mg/dL (ref 8–23)
CO2: 21 mmol/L — ABNORMAL LOW (ref 22–32)
Calcium: 9.5 mg/dL (ref 8.9–10.3)
Chloride: 109 mmol/L (ref 98–111)
Creatinine, Ser: 0.8 mg/dL (ref 0.44–1.00)
GFR, Estimated: >60 mL/min (ref >60)
Glucose, Bld: 149 mg/dL — ABNORMAL HIGH (ref 70–99)
Potassium: 4.3 mmol/L (ref 3.5–5.1)
Sodium: 140 mmol/L (ref 135–145)
Total Bilirubin: 0.8 mg/dL (ref 0.3–1.2)
Total Protein: 7.2 g/dL (ref 6.5–8.1)

## 2022-08-02 LAB — CBC
HCT: 46.5 % — ABNORMAL HIGH (ref 36.0–46.0)
Hemoglobin: 15.6 g/dL — ABNORMAL HIGH (ref 12.0–15.0)
MCH: 31.7 pg (ref 26.0–34.0)
MCHC: 33.5 g/dL (ref 30.0–36.0)
MCV: 94.5 fL (ref 80.0–100.0)
Platelets: 219 10*3/uL (ref 150–400)
RBC: 4.92 MIL/uL (ref 3.87–5.11)
RDW: 12.2 % (ref 11.5–15.5)
WBC: 8 10*3/uL (ref 4.0–10.5)
nRBC: 0 % (ref 0.0–0.2)

## 2022-08-02 LAB — RESP PANEL BY RT-PCR (FLU A&B, COVID) ARPGX2
Influenza A by PCR: NEGATIVE
Influenza B by PCR: NEGATIVE
SARS Coronavirus 2 by RT PCR: NEGATIVE

## 2022-08-02 LAB — PROTIME-INR
INR: 1 (ref 0.8–1.2)
Prothrombin Time: 12.7 seconds (ref 11.4–15.2)

## 2022-08-02 LAB — HEMOGLOBIN A1C
Hgb A1c MFr Bld: 5.6 % (ref 4.8–5.6)
Mean Plasma Glucose: 114.02 mg/dL

## 2022-08-02 LAB — I-STAT CREATININE, ED: Creatinine, Ser: 0.8 mg/dL (ref 0.44–1.00)

## 2022-08-02 LAB — ETHANOL: Alcohol, Ethyl (B): <10 mg/dL (ref <10)

## 2022-08-02 LAB — APTT: aPTT: 26 seconds (ref 24–36)

## 2022-08-02 LAB — CBG MONITORING, ED: Glucose-Capillary: 145 mg/dL — ABNORMAL HIGH (ref 70–99)

## 2022-08-02 MED ORDER — ASPIRIN 81 MG PO TBEC
81.0000 mg | DELAYED_RELEASE_TABLET | Freq: Every day | ORAL | Status: DC
  Administered 2022-08-02 – 2022-08-03 (×2): 81 mg via ORAL
  Filled 2022-08-02 (×2): qty 1

## 2022-08-02 MED ORDER — SODIUM CHLORIDE 0.9% FLUSH
3.0000 mL | Freq: Once | INTRAVENOUS | Status: AC
  Administered 2022-08-02: 3 mL via INTRAVENOUS

## 2022-08-02 MED ORDER — IOHEXOL 350 MG/ML SOLN
75.0000 mL | Freq: Once | INTRAVENOUS | Status: AC | PRN
  Administered 2022-08-02: 100 mL via INTRAVENOUS

## 2022-08-02 MED ORDER — ALBUTEROL SULFATE (2.5 MG/3ML) 0.083% IN NEBU: 2.5000 mg | INHALATION_SOLUTION | RESPIRATORY_TRACT | Status: DC | PRN

## 2022-08-02 MED ORDER — ENOXAPARIN SODIUM 40 MG/0.4ML IJ SOSY
40.0000 mg | PREFILLED_SYRINGE | INTRAMUSCULAR | Status: DC
  Administered 2022-08-02: 40 mg via SUBCUTANEOUS
  Filled 2022-08-02: qty 0.4

## 2022-08-02 MED ORDER — ONDANSETRON HCL 4 MG/2ML IJ SOLN: 4.0000 mg | Freq: Three times a day (TID) | INTRAMUSCULAR | Status: DC | PRN

## 2022-08-02 MED ORDER — ACETAMINOPHEN 650 MG RE SUPP: 650.0000 mg | RECTAL | Status: DC | PRN

## 2022-08-02 MED ORDER — ROSUVASTATIN CALCIUM 20 MG PO TABS
20.0000 mg | ORAL_TABLET | Freq: Every day | ORAL | Status: DC
  Administered 2022-08-02: 20 mg via ORAL
  Filled 2022-08-02 (×2): qty 1

## 2022-08-02 MED ORDER — MECLIZINE HCL 25 MG PO TABS: 25.0000 mg | ORAL_TABLET | Freq: Every day | ORAL | Status: DC | PRN

## 2022-08-02 MED ORDER — ACETAMINOPHEN 160 MG/5ML PO SOLN: 650.0000 mg | ORAL | Status: DC | PRN

## 2022-08-02 MED ORDER — NICOTINE 21 MG/24HR TD PT24
21.0000 mg | MEDICATED_PATCH | Freq: Every day | TRANSDERMAL | Status: DC
  Filled 2022-08-02: qty 1

## 2022-08-02 MED ORDER — DM-GUAIFENESIN ER 30-600 MG PO TB12: 1.0000 | ORAL_TABLET | Freq: Two times a day (BID) | ORAL | Status: DC | PRN

## 2022-08-02 MED ORDER — SENNOSIDES-DOCUSATE SODIUM 8.6-50 MG PO TABS: 1.0000 | ORAL_TABLET | Freq: Every evening | ORAL | Status: DC | PRN

## 2022-08-02 MED ORDER — ACETAMINOPHEN 325 MG PO TABS: 650.0000 mg | ORAL_TABLET | ORAL | Status: DC | PRN

## 2022-08-02 MED ORDER — STROKE: EARLY STAGES OF RECOVERY BOOK: Freq: Once | Status: AC

## 2022-08-02 NOTE — Assessment & Plan Note (Addendum)
Etiology is not clear.  Urine analysis negative.  Chest x-ray negative.  No signs of infection.  Temperature came up and no further symptoms

## 2022-08-02 NOTE — ED Notes (Signed)
MRI screening patient at this time. 

## 2022-08-02 NOTE — Assessment & Plan Note (Signed)
-   Nicotine patch -Did counseling about importance of quitting smoking 

## 2022-08-02 NOTE — Progress Notes (Signed)
Returned from [facility]. Dr. Leonel Ramsay at bedside

## 2022-08-02 NOTE — ED Provider Notes (Signed)
[facility] Provider Note    Event Date/Time   First MD Initiated Contact with Patient 08/02/22 1123     (approximate)  History   Chief Complaint: Slurred speech  HPI  Brittany Burton is a 66 y.o. female with a past medical history of hyperlipidemia, vertigo, presents to the emergency department for weakness and slurred speech.  According to the patient she was out golfing this morning when she suddenly became very weak lightheaded states her vision was blurring and she was having some difficulty speaking described more as a slurred speech.  Feels like the majority of her symptoms have resolved but the patient states she feels very cold she is shivering in the emergency department and found to have a rectal temperature of 94.9.  Patient denies any infectious symptoms denies any cough congestion any known fever, vomiting or diarrhea but did states she became nauseated today.  No urinary symptoms.  Patient denies any focal weakness or numbness.  No confusion.  Physical Exam   Triage Vital Signs: ED Triage Vitals  Enc Vitals Group     BP 08/02/22 1115 123/74     Pulse Rate 08/02/22 1115 69     Resp --      Temp 08/02/22 1157 (!) 94.9 F (34.9 C)     Temp Source 08/02/22 1157 Rectal     SpO2 08/02/22 1115 93 %     Weight --      Height 08/02/22 1116 '5\' 5"'$  (1.651 m)     Head Circumference --      Peak Flow --      Pain Score 08/02/22 1116 0     Pain Loc --      Pain Edu? --      Excl. in [place]? --     Most recent vital signs: Vitals:   08/02/22 1115 08/02/22 1157  BP: 123/74   Pulse: 69   Temp:  (!) 94.9 F (34.9 C)  SpO2: 93%     General: Awake, no distress.  CV:  Good peripheral perfusion.  Regular rate and rhythm  Resp:  Normal effort.  Equal breath sounds bilaterally.  Abd:  No distention.  Soft, nontender.  No rebound or guarding. Other:  See neurology note for NIH stroke scale   ED Results / Procedures / Treatments   EKG  EKG viewed  and interpreted by myself shows what appears to be a sinus rhythm at 90 bpm with a narrow QRS, normal axis, normal intervals, nonspecific ST changes.  RADIOLOGY  I have reviewed and interpreted the chest x-ray images.  No obvious infiltrate seen on my evaluation. Radiology is read the chest x-ray as negative. CT scan head negative for acute abnormality.  MEDICATIONS ORDERED IN ED: Medications  sodium chloride flush (NS) 0.9 % injection 3 mL (has no administration in time range)  iohexol (OMNIPAQUE) 350 MG/ML injection 75 mL (100 mLs Intravenous Contrast Given 08/02/22 1137)     IMPRESSION / MDM / ASSESSMENT AND PLAN / ED COURSE  I reviewed the triage vital signs and the nursing notes.  Patient's presentation is most consistent with acute presentation with potential threat to life or bodily function.  Patient presents emergency department as a code stroke, neurology seeing in conjunction with myself.  Patient states slurred speech lightheadedness dizziness just before arrival while golfing.  Patient was brought immediately to the CT scanner, no finding on CT imaging.  Patient found have a rectal temperature of 94.9, no other  infectious symptoms noted however given the hypothermia on rectal temp we will check labs, urinalysis, chest x-ray and a COVID swab looking for any possible infectious source.  Neurology believes this could be indicating more presyncope type symptoms and less likely to be CVA related.  We will proceed with an MRI to rule out CVA while awaiting medical work-up results.  Patient agreeable to plan.  Patient work-up shows no significant finding.  CT scans are negative for acute abnormality, chest x-ray is clear, urinalysis is normal, COVID and flu is negative, CBC is normal, chemistry is normal, ethanol is negative.  However patient continues to feel weak, temperature although increasing was quite hypothermic 94.9 for no reason.  We will admit the patient to the hospitalist  service for ongoing work-up, MRI is pending.  FINAL CLINICAL IMPRESSION(S) / ED DIAGNOSES   Dizziness Weakness Hypothermia  Note:  This document was prepared using Dragon voice recognition software and may include unintentional dictation errors.   Harvest Dark, MD 08/02/22 1527

## 2022-08-02 NOTE — ED Notes (Signed)
Tele Neuro cart activated at this time.

## 2022-08-02 NOTE — ED Triage Notes (Signed)
Pt presents to ED from home C/O sudden onset dizziness, blurred vision, and difficulty speaking. Spouse reports slurred speech.

## 2022-08-02 NOTE — ED Notes (Signed)
Bare hugger removed due to temperature increase, warm blankets applied.

## 2022-08-02 NOTE — Assessment & Plan Note (Signed)
Patient states that she had 1 episode of A-fib 10 years ago which did not relapse.  No arrhythmias on telemetry

## 2022-08-02 NOTE — Assessment & Plan Note (Addendum)
Etiology is not clear.  MRI of the brain is negative.  CT head negative.  CTA of head and neck is negative for LVO.  Does not seem to have TIA or stroke.  Dr. Leonel Ramsay of neurology is consulted. -Placed on telemetry bed for ablation -Fall precaution -PT/OT -Aspirin 81 mg empirically

## 2022-08-02 NOTE — Progress Notes (Signed)
CODE STROKE- PHARMACY COMMUNICATION   Time CODE STROKE called/page received: 1116  Time response to CODE STROKE was made (in person or via phone): 1116  Time Stroke Kit retrieved from [place] (only if needed): not indicated per neurology (risk outweighs benefit)  Name of Provider/Nurse contacted: Roland Rack, MD  Past Medical History:  Diagnosis Date   BACK PAIN 03/18/2010   COPD (chronic obstructive pulmonary disease) ([place]) 05/18/2017   GERD 06/03/2009   HYPERLIPIDEMIA 06/03/2009   Kidney stone    LEG PAIN, RIGHT 03/18/2010   Osteopenia 06/2018   T score -1.8 FRAX 9.4% / 1.8%   PAF (paroxysmal atrial fibrillation) ([place]) 09/04/2011   POSTURAL LIGHTHEADEDNESS 06/17/2010   Vertigo    chronic recurrent, BPV, probl left related   VITAMIN D DEFICIENCY 06/03/2009   Prior to Admission medications   Medication Sig Start Date End Date Taking? Authorizing Provider  aspirin 81 MG EC tablet Take 1 tablet (81 mg total) by mouth daily. Swallow whole. 01/31/13   Biagio Borg, MD  meclizine (ANTIVERT) 25 MG tablet Take 1 tablet (25 mg total) by mouth daily as needed. 01/14/20   Biagio Borg, MD  rosuvastatin (CRESTOR) 20 MG tablet TAKE 1 TABLET BY MOUTH EVERY DAY 02/03/22   Biagio Borg, MD  sulfamethoxazole-trimethoprim (BACTRIM DS) 800-160 MG tablet Take 1 tablet by mouth 2 (two) times daily. 06/29/22   Billey Co, MD   Thank you for allowing pharmacy to be a part of this patient's care.  Gretel Acre, PharmD PGY1 Pharmacy Resident 08/02/2022 1:53 PM

## 2022-08-02 NOTE — ED Notes (Signed)
Bear hugger applied to patient. Pt denies any needs at this time.  Purewick placed

## 2022-08-02 NOTE — Assessment & Plan Note (Signed)
-   Fall precaution -As needed meclizine

## 2022-08-02 NOTE — Assessment & Plan Note (Signed)
Continue Crestor 

## 2022-08-02 NOTE — ED Triage Notes (Signed)
Code stroke called in triage, activated by Nira Conn, [facility].

## 2022-08-02 NOTE — ED Notes (Signed)
PT husband stating symptom onset 86.

## 2022-08-02 NOTE — Progress Notes (Signed)
Code stroke activated via Telestroke cart. Pt in CT at this time.

## 2022-08-02 NOTE — H&P (Signed)
History and Physical    Brittany Burton HBZ:[id number] DOB: 1956/09/24 DOA: 08/02/2022  Referring MD/NP/PA:   PCP: Biagio Borg, MD   Patient coming from:  The patient is coming from home.  At baseline, pt is independent for most of ADL.        Chief Complaint: Dizziness, slurred speech  HPI: Brittany Burton is a 66 y.o. female with medical history significant of hyperlipidemia, COPD, GERD, tobacco abuse, PAF not on anticoagulants, vertigo, kidney stone, who presents with dizziness and slurred speech.  Patient states that her symptoms started suddenly at about 10:15 in the morning when she was outside playing golf. She has dizziness, lightheadedness and slurred speech.  No unilateral numbness or tinglings in extremities.  She has history of vertigo, but states that today symptoms are very different. Patient denies chest pain, shortness breath. Patient has mild dry cough.  No nausea, vomiting, diarrhea or abdominal pain.  Patient has chills, no fever.   Data reviewed independently and ED Course: pt was found to have WBC 8.0, negative urinalysis, negative COVID PCR, alcohol level less than 10, GFR> 60, hypothermia with temperature 94.9, blood pressure 108/63, heart rate 89, RR 20, chest x-ray negative.  CT head negative.  MRI of brain negative.  CTA of head and neck negative for LVO.  Patient is placed on telemetry bed for observation. Dr. Leonel Ramsay for neurology is consulted.   EKG: I have personally reviewed.  Sinus rhythm, QTc 470, low voltage, nonspecific T wave change   Review of Systems:   General: no fevers, has chills, no body weight gain, fatigue HEENT: no blurry vision, hearing changes or sore throat Respiratory: no dyspnea, has coughing, no wheezing CV: no chest pain, no palpitations GI: no nausea, vomiting, abdominal pain, diarrhea, constipation GU: no dysuria, burning on urination, increased urinary frequency, hematuria  Ext: no leg edema Neuro: no unilateral  weakness, numbness, or tingling, no vision change or hearing loss. Has dizziness, lightheadedness, slurred speech Skin: no rash, no skin tear. MSK: No muscle spasm, no deformity, no limitation of range of movement in spin Heme: No easy bruising.  Travel history: No recent long distant travel.   Allergy: No Known Allergies  Past Medical History:  Diagnosis Date   BACK PAIN 03/18/2010   COPD (chronic obstructive pulmonary disease) ([place]) 05/18/2017   GERD 06/03/2009   HYPERLIPIDEMIA 06/03/2009   Kidney stone    LEG PAIN, RIGHT 03/18/2010   Osteopenia 06/2018   T score -1.8 FRAX 9.4% / 1.8%   PAF (paroxysmal atrial fibrillation) ([place]) 09/04/2011   POSTURAL LIGHTHEADEDNESS 06/17/2010   Vertigo    chronic recurrent, BPV, probl left related   VITAMIN D DEFICIENCY 06/03/2009    History reviewed. No pertinent surgical history.  Social History:  reports that she has been smoking cigarettes. She has a 75.25 pack-year smoking history. She has never used smokeless tobacco. She reports that she does not drink alcohol and does not use drugs.  Family History:  Family History  Problem Relation Age of Onset   Melanoma Father    Esophageal cancer Father    Ovarian cancer Sister 54       stage 60   Melanoma Sister    Cancer Paternal Grandmother        colon late 70's   Colon cancer Paternal Grandmother    Pancreatic cancer Neg Hx    Rectal cancer Neg Hx    Stomach cancer Neg Hx      Prior to Admission  medications   Medication Sig Start Date End Date Taking? Authorizing Provider  aspirin 81 MG EC tablet Take 1 tablet (81 mg total) by mouth daily. Swallow whole. 01/31/13   Biagio Borg, MD  meclizine (ANTIVERT) 25 MG tablet Take 1 tablet (25 mg total) by mouth daily as needed. 01/14/20   Biagio Borg, MD  rosuvastatin (CRESTOR) 20 MG tablet TAKE 1 TABLET BY MOUTH EVERY DAY 02/03/22   Biagio Borg, MD  sulfamethoxazole-trimethoprim (BACTRIM DS) 800-160 MG tablet Take 1 tablet by mouth 2  (two) times daily. 06/29/22   Billey Co, MD    Physical Exam: Vitals:   08/02/22 1441 08/02/22 1530 08/02/22 1532 08/02/22 1800  BP:  117/72  (!) 113/53  Pulse:  68  66  Resp:  17    Temp: 97.9 F (36.6 C)  98.3 F (36.8 C)   TempSrc: Oral  Oral   SpO2:  97%  94%  Weight:      Height:       General: Not in acute distress HEENT:       Eyes: PERRL, EOMI, no scleral icterus.       ENT: No discharge from the ears and nose, no pharynx injection, no tonsillar enlargement.        Neck: No JVD, no bruit, no mass felt. Heme: No neck lymph node enlargement. Cardiac: S1/S2, RRR, No murmurs, No gallops or rubs. Respiratory: No rales, wheezing, rhonchi or rubs. GI: Soft, nondistended, nontender, no rebound pain, no organomegaly, BS present. GU: No hematuria Ext: No pitting leg edema bilaterally. 1+DP/PT pulse bilaterally. Musculoskeletal: No joint deformities, No joint redness or warmth, no limitation of ROM in spin. Skin: No rashes.  Neuro: Alert, oriented X3, cranial nerves II-XII grossly intact, moves all extremities normally. Muscle strength 5/5 in all extremities, sensation to light touch intact.  Psych: Patient is not psychotic, no suicidal or hemocidal ideation.  Labs on Admission: I have personally reviewed following labs and imaging studies  CBC: Recent Labs  Lab 08/02/22 1125  WBC 8.0  NEUTROABS 4.9  HGB 15.6*  HCT 46.5*  MCV 94.5  PLT 732   Basic Metabolic Panel: Recent Labs  Lab 08/02/22 1125 08/02/22 1144  NA 140  --   K 4.3  --   CL 109  --   CO2 21*  --   GLUCOSE 149*  --   BUN 18  --   CREATININE 0.80 0.80  CALCIUM 9.5  --    GFR: Estimated Creatinine Clearance: 72.1 mL/min (by C-G formula based on SCr of 0.8 mg/dL). Liver Function Tests: Recent Labs  Lab 08/02/22 1125  AST 33  ALT 29  ALKPHOS 71  BILITOT 0.8  PROT 7.2  ALBUMIN 4.1   No results for input(s): "LIPASE", "AMYLASE" in the last 168 hours. No results for input(s): "AMMONIA"  in the last 168 hours. Coagulation Profile: Recent Labs  Lab 08/02/22 1206  INR 1.0   Cardiac Enzymes: No results for input(s): "CKTOTAL", "CKMB", "CKMBINDEX", "TROPONINI" in the last 168 hours. BNP (last 3 results) No results for input(s): "PROBNP" in the last 8760 hours. HbA1C: No results for input(s): "HGBA1C" in the last 72 hours. CBG: Recent Labs  Lab 08/02/22 1114  GLUCAP 145*   Lipid Profile: No results for input(s): "CHOL", "HDL", "LDLCALC", "TRIG", "CHOLHDL", "LDLDIRECT" in the last 72 hours. Thyroid Function Tests: No results for input(s): "TSH", "T4TOTAL", "FREET4", "T3FREE", "THYROIDAB" in the last 72 hours. Anemia Panel: No results for input(s): "  VITAMINB12", "FOLATE", "FERRITIN", "TIBC", "IRON", "RETICCTPCT" in the last 72 hours. Urine analysis:    Component Value Date/Time   COLORURINE STRAW (A) 08/02/2022 1444   APPEARANCEUR CLEAR (A) 08/02/2022 1444   LABSPEC >1.046 (H) 08/02/2022 1444   PHURINE 5.0 08/02/2022 1444   GLUCOSEU NEGATIVE 08/02/2022 1444   GLUCOSEU NEGATIVE 06/22/2022 0950   HGBUR NEGATIVE 08/02/2022 1444   BILIRUBINUR NEGATIVE 08/02/2022 1444   KETONESUR NEGATIVE 08/02/2022 1444   PROTEINUR NEGATIVE 08/02/2022 1444   UROBILINOGEN 0.2 06/22/2022 0950   NITRITE NEGATIVE 08/02/2022 1444   LEUKOCYTESUR NEGATIVE 08/02/2022 1444   Sepsis Labs: '@LABRCNTIP'$ (procalcitonin:4,lacticidven:4) ) Recent Results (from the past 240 hour(s))  Resp Panel by RT-PCR (Flu A&B, Covid) Anterior Nasal Swab     Status: None   Collection Time: 08/02/22 12:05 PM   Specimen: Anterior Nasal Swab  Result Value Ref Range Status   SARS Coronavirus 2 by RT PCR NEGATIVE NEGATIVE Final    Comment: (NOTE) SARS-CoV-2 target nucleic acids are NOT DETECTED.  The SARS-CoV-2 RNA is generally detectable in upper respiratory specimens during the acute phase of infection. The lowest concentration of SARS-CoV-2 viral copies this assay can detect is 138 copies/mL. A negative  result does not preclude SARS-Cov-2 infection and should not be used as the sole basis for treatment or other patient management decisions. A negative result may occur with  improper specimen collection/handling, submission of specimen other than nasopharyngeal swab, presence of viral mutation(s) within the areas targeted by this assay, and inadequate number of viral copies(<138 copies/mL). A negative result must be combined with clinical observations, patient history, and epidemiological information. The expected result is Negative.  Fact Sheet for Patients:  EntrepreneurPulse.com.au  Fact Sheet for Healthcare Providers:  IncredibleEmployment.be  This test is no t yet approved or cleared by the [place] FDA and  has been authorized for detection and/or diagnosis of SARS-CoV-2 by FDA under an Emergency Use Authorization (EUA). This EUA will remain  in effect (meaning this test can be used) for the duration of the COVID-19 declaration under Section 564(b)(1) of the Act, 21 U.S.C.section 360bbb-3(b)(1), unless the authorization is terminated  or revoked sooner.       Influenza A by PCR NEGATIVE NEGATIVE Final   Influenza B by PCR NEGATIVE NEGATIVE Final    Comment: (NOTE) The Xpert Xpress SARS-CoV-2/FLU/RSV plus assay is intended as an aid in the diagnosis of influenza from Nasopharyngeal swab specimens and should not be used as a sole basis for treatment. Nasal washings and aspirates are unacceptable for Xpert Xpress SARS-CoV-2/FLU/RSV testing.  Fact Sheet for Patients: EntrepreneurPulse.com.au  Fact Sheet for Healthcare Providers: IncredibleEmployment.be  This test is not yet approved or cleared by the [place] FDA and has been authorized for detection and/or diagnosis of SARS-CoV-2 by FDA under an Emergency Use Authorization (EUA). This EUA will remain in effect (meaning this test can be used)  for the duration of the COVID-19 declaration under Section 564(b)(1) of the Act, 21 U.S.C. section 360bbb-3(b)(1), unless the authorization is terminated or revoked.  Performed at [facility], [address] [place], [place] [address]      Radiological Exams on Admission: MR BRAIN WO CONTRAST  Result Date: 08/02/2022 CLINICAL DATA:  Sudden onset dizziness, blurry vision, difficulty speaking. EXAM: MRI HEAD WITHOUT CONTRAST TECHNIQUE: Multiplanar, multiecho pulse sequences of the brain and surrounding structures were obtained without intravenous contrast. COMPARISON:  Same-day CT/CTA head and neck FINDINGS: Brain: There is no acute intracranial hemorrhage, extra-axial fluid collection, or  acute infarct. Parenchymal volume is normal. The ventricles are normal in size. Gray-white differentiation is preserved. Parenchymal signal is normal with no significant burden of chronic small vessel ischemic change. There is no mass lesion.  There is no mass effect or midline shift. Vascular: Normal flow voids. Skull and upper cervical spine: Normal marrow signal. Sinuses/Orbits: The paranasal sinuses are clear. The globes and orbits are unremarkable. Other: None. IMPRESSION: Normal brain MRI. Electronically Signed   By: Valetta Mole M.D.   On: 08/02/2022 16:32   DG Chest Portable 1 View  Result Date: 08/02/2022 CLINICAL DATA:  Weakness EXAM: PORTABLE CHEST 1 VIEW COMPARISON:  Chest 09/23/2015 FINDINGS: The heart size and mediastinal contours are within normal limits. Negative for infiltrate. Mild scarring lingula unchanged. The visualized skeletal structures are unremarkable. IMPRESSION: No active disease. Electronically Signed   By: Franchot Gallo M.D.   On: 08/02/2022 12:38   CT ANGIO HEAD NECK W WO CM (CODE STROKE)  Result Date: 08/02/2022 CLINICAL DATA:  The onset dizziness, blurred vision, difficulty speaking. EXAM: CT ANGIOGRAPHY HEAD AND NECK TECHNIQUE: Multidetector CT imaging of the head  and neck was performed using the standard protocol during bolus administration of intravenous contrast. Multiplanar CT image reconstructions and MIPs were obtained to evaluate the vascular anatomy. Carotid stenosis measurements (when applicable) are obtained utilizing NASCET criteria, using the distal internal carotid diameter as the denominator. RADIATION DOSE REDUCTION: This exam was performed according to the departmental dose-optimization program which includes automated exposure control, adjustment of the mA and/or kV according to patient size and/or use of iterative reconstruction technique. CONTRAST:  137m OMNIPAQUE IOHEXOL 350 MG/ML SOLN COMPARISON:  None Available. FINDINGS: CTA NECK FINDINGS Aortic arch: The imaged aortic arch is normal. The origins of the major branch vessels are patent. The subclavian arteries are patent to the level imaged. Right carotid system: The right common, internal, and external carotid arteries are patent, without hemodynamically significant stenosis or occlusion. There is no dissection or aneurysm. Left carotid system: The left common, internal, and external carotid arteries are patent, without hemodynamically significant stenosis or occlusion. There is no dissection or aneurysm. There is medialized course of the proximal left internal carotid artery. Vertebral arteries: The vertebral arteries are patent, without hemodynamically significant stenosis or occlusion. There is no dissection or aneurysm. Skeleton: There is mild multilevel degenerative change of the cervical spine. There is no acute osseous abnormality or suspicious osseous lesion. There is no visible canal hematoma. Other neck: The soft tissues of the neck are unremarkable. Upper chest: There is centrilobular emphysema in the lung apices. Review of the MIP images confirms the above findings CTA HEAD FINDINGS Anterior circulation: There is minimal calcified plaque in the left intracranial ICA without hemodynamically  significant stenosis or occlusion. The right intracranial ICA is patent The bilateral MCAs are patent. The bilateral ACAs are patent. There is no aneurysm or AVM. Posterior circulation: The bilateral V4 segments are patent. The basilar artery is patent. The major cerebellar artery origins are patent. The bilateral PCAs are patent. A right posterior communicating artery is identified. The left posterior communicating artery is not definitely seen. There is no aneurysm or AVM. Venous sinuses: Patent. Anatomic variants: None. Review of the MIP images confirms the above findings IMPRESSION: Patent vasculature of the head and neck with no hemodynamically significant stenosis or occlusion. Emphysema. Electronically Signed   By: PValetta MoleM.D.   On: 08/02/2022 12:10   CT HEAD CODE STROKE WO CONTRAST  Result Date: 08/02/2022 CLINICAL  DATA:  Code stroke.  Neuro deficit, acute, stroke suspected EXAM: CT HEAD WITHOUT CONTRAST TECHNIQUE: Contiguous axial images were obtained from the base of the skull through the vertex without intravenous contrast. RADIATION DOSE REDUCTION: This exam was performed according to the departmental dose-optimization program which includes automated exposure control, adjustment of the mA and/or kV according to patient size and/or use of iterative reconstruction technique. COMPARISON:  None Available. FINDINGS: Brain: No evidence of acute large vascular territory infarction, hemorrhage, hydrocephalus, extra-axial collection or mass lesion/mass effect. Vascular: No hyperdense vessel identified. Skull: No acute fracture Sinuses/Orbits: No acute findings. ASPECTS [facility] Stroke Program Early CT Score) total score (0-10 with 10 being normal): 10. IMPRESSION: 1. No evidence of acute intracranial abnormality. 2. ASPECTS is 10. Code stroke imaging results were communicated on 08/02/2022 at 11:39 am to provider Dr. Leonel Ramsay via secure text paging. Electronically Signed   By: Margaretha Sheffield M.D.    On: 08/02/2022 11:40      Assessment/Plan Principal Problem:   Stroke-like symptom Active Problems:   PAF (paroxysmal atrial fibrillation) ([facility])   Hyperlipidemia   COPD (chronic obstructive pulmonary disease) ([facility])   Tobacco abuse   Hypothermia   Vertigo   Assessment and Plan: * Stroke-like symptom Etiology is not clear.  MRI of the brain is negative.  CT head negative.  CTA of head and neck is negative for LVO.  Does not seem to have TIA or stroke.  Dr. Leonel Ramsay of neurology is consulted. -Placed on telemetry bed for ablation -Fall precaution -PT/OT -Aspirin 81 mg empirically  PAF (paroxysmal atrial fibrillation) ([place]) Patient states that she had 1 episode of A-fib 10 years ago which did not relapse.  Heart rate 89.  Today EKG shows sinus rhythm.  Patient is not taking anticoagulants. -Telemetry monitoring  Hyperlipidemia - Crestor  COPD (chronic obstructive pulmonary disease) (HCC) Stable -As needed albuterol  Tobacco abuse - Nicotine patch -Did counseling about importance of quitting smoking  Hypothermia Etiology is not clear.  No source of infection identified.  Chest x-ray negative.  Urinalysis negative.  Hypothermia has resolved.  Currently body weight 98.3 -Observe closely  Vertigo - Fall precaution -As needed meclizine          DVT ppx: SQ Lovenox  Code Status: Full code  Family Communication:    Yes, patient's husband   at bed side.       Disposition Plan:  Anticipate discharge back to previous environment  Consults called: Dr. Leonel Ramsay for neurology  Admission status and Level of care: Telemetry Medical:   for obs      Dispo: The patient is from: Home              Anticipated d/c is to: Home              Anticipated d/c date is: 1 day              Patient currently is not medically stable to d/c.    Severity of Illness:  The appropriate patient status for this patient is OBSERVATION. Observation status is judged to be  reasonable and necessary in order to provide the required intensity of service to ensure the patient's safety. The patient's presenting symptoms, physical exam findings, and initial radiographic and laboratory data in the context of their medical condition is felt to place them at decreased risk for further clinical deterioration. Furthermore, it is anticipated that the patient will be medically stable for discharge from the hospital within 2 midnights  of admission.        Date of Service 08/02/2022    Ivor Costa [facility]   If 7PM-7AM, please contact night-coverage [email or web address] 08/02/2022, 6:33 PM

## 2022-08-02 NOTE — Consult Note (Signed)
Neurology Consultation Reason for Consult: Dizziness Referring Physician: Tempie Donning  CC: Lightheadedness  History is obtained from: Patient  HPI: Brittany Burton is a 66 y.o. female who was outside this morning playing golf when she had abrupt onset of lightheadedness.  She has a history of vertigo, and states that this is very different than what she is experienced in the past.  This was more of a lightheadedness, feeling like she was about to pass out.  She laid down and felt better, and she continues to have lightheadedness with sitting up, but feeling better when she is laying down.  She denies any chest pain, palpitations, etc.  LKW: 10:45 AM tpa given?: no, mild symptoms   ROS: A 14 point ROS was performed and is negative except as noted in the HPI. Marland Kitchen   Past Medical History:  Diagnosis Date   BACK PAIN 03/18/2010   COPD (chronic obstructive pulmonary disease) ([place]) 05/18/2017   GERD 06/03/2009   HYPERLIPIDEMIA 06/03/2009   Kidney stone    LEG PAIN, RIGHT 03/18/2010   Osteopenia 06/2018   T score -1.8 FRAX 9.4% / 1.8%   PAF (paroxysmal atrial fibrillation) ([place]) 09/04/2011   POSTURAL LIGHTHEADEDNESS 06/17/2010   Vertigo    chronic recurrent, BPV, probl left related   VITAMIN D DEFICIENCY 06/03/2009     Family History  Problem Relation Age of Onset   Melanoma Father    Esophageal cancer Father    Ovarian cancer Sister 35       stage 7   Melanoma Sister    Cancer Paternal Grandmother        colon late 70's   Colon cancer Paternal Grandmother    Pancreatic cancer Neg Hx    Rectal cancer Neg Hx    Stomach cancer Neg Hx      Social History:  reports that she has been smoking cigarettes. She has a 75.25 pack-year smoking history. She has never used smokeless tobacco. She reports that she does not drink alcohol and does not use drugs.   Exam: Current vital signs: BP 118/60   Pulse 76   Temp (!) 94.9 F (34.9 C) (Rectal)   Resp 16   Ht '5\' 5"'$  (1.651  m)   SpO2 96%   BMI 29.12 kg/m  Vital signs in last 24 hours: Temp:  [94.9 F (34.9 C)] 94.9 F (34.9 C) (09/12 1157) Pulse Rate:  [69-89] 76 (09/12 1230) Resp:  [16-22] 16 (09/12 1230) BP: (118-123)/(60-78) 118/60 (09/12 1230) SpO2:  [93 %-99 %] 96 % (09/12 1230)   Physical Exam  Constitutional: Appears well-developed and well-nourished.  Psych: Affect appropriate to situation Eyes: No scleral injection HENT: No OP obstruction MSK: no joint deformities.  Cardiovascular: Normal rate and regular rhythm.  Respiratory: Effort normal, non-labored breathing GI: Soft.  No distension. There is no tenderness.  Skin: WDI  Neuro: Mental Status: Patient is awake, alert, oriented to person, place, month, year, and situation. Patient is able to give a clear and coherent history. No signs of aphasia or neglect Cranial Nerves: II: Visual Fields are full. Pupils are equal, round, and reactive to light.   III,IV, VI: EOMI without ptosis or diploplia.  V: Facial sensation is symmetric to temperature VII: Facial movement is symmetric.  VIII: hearing is intact to voice X: Uvula elevates symmetrically XI: Shoulder shrug is symmetric. XII: tongue is midline without atrophy or fasciculations.  Motor: Tone is normal. Bulk is normal. 5/5 strength was present in all four extremities.  Sensory: Sensation is symmetric to light touch and temperature in the arms and legs. Plantars: Toes are downgoing bilaterally.  Cerebellar: FNF and HKS are intact bilaterally   I have reviewed labs in epic and the results pertinent to this consultation are: CMP-unremarkable  I have reviewed the images obtained: ct/cta - negative  Impression: 66 year old female presenting with lightheadedness in the setting of hypothermia of unclear cause.  The symptoms that she describes including the visual dimming can be seen with presyncope, and she was more symptomatic with sitting up on my exam.  At this time, she is  not a tenecteplase candidate given her mild symptoms, if this is stroke I would expect gradual improvement over time and think that the risk of tenecteplase outweighs the expected benefit.  My suspicion, however, is that this is more related to some sort of systemic issue, as supported by her hypothermia.  With the description of some slurred speech (which in and of itself is a nonspecific symptom) I do think would be reasonable to pursue an MRI, and if it is positive then we can pursue a stroke work-up, but if negative then I would not pursue any further neurological work-up at this time.  Recommendations: 1) MRI brain 2) further neurological work-up only if positive.   Roland Rack, MD [facility] [phone]  If 7pm- 7am, please page neurology on call as listed in [place].

## 2022-08-02 NOTE — Assessment & Plan Note (Signed)
-   Stable -As needed albuterol 

## 2022-08-02 NOTE — ED Notes (Signed)
Code  stroke  called  to  [facility] 

## 2022-08-03 DIAGNOSIS — T68XXXS Hypothermia, sequela: Secondary | ICD-10-CM | POA: Diagnosis not present

## 2022-08-03 DIAGNOSIS — I480 Paroxysmal atrial fibrillation: Secondary | ICD-10-CM | POA: Diagnosis not present

## 2022-08-03 DIAGNOSIS — E785 Hyperlipidemia, unspecified: Secondary | ICD-10-CM | POA: Diagnosis not present

## 2022-08-03 DIAGNOSIS — R2990 Unspecified symptoms and signs involving the nervous system: Secondary | ICD-10-CM | POA: Diagnosis not present

## 2022-08-03 DIAGNOSIS — R42 Dizziness and giddiness: Secondary | ICD-10-CM

## 2022-08-03 DIAGNOSIS — J449 Chronic obstructive pulmonary disease, unspecified: Secondary | ICD-10-CM | POA: Diagnosis not present

## 2022-08-03 LAB — LIPID PANEL
Cholesterol: 139 mg/dL (ref 0–200)
HDL: 45 mg/dL (ref >40)
LDL Cholesterol: 74 mg/dL (ref 0–99)
Total CHOL/HDL Ratio: 3.1 RATIO
Triglycerides: 98 mg/dL (ref <150)
VLDL: 20 mg/dL (ref 0–40)

## 2022-08-03 LAB — HIV ANTIBODY (ROUTINE TESTING W REFLEX): HIV Screen 4th Generation wRfx: NONREACTIVE

## 2022-08-03 NOTE — Discharge Summary (Signed)
Physician Discharge Summary   Patient: Brittany Burton MRN: [id number] DOB: December 23, 1955  Admit date:     08/02/2022  Discharge date: 08/03/22  Discharge Physician: Loletha Grayer   PCP: Biagio Borg, MD   Recommendations at discharge:   Follow-up PCP 5 days  Discharge Diagnoses: Principal Problem:   Stroke-like symptom Active Problems:   Hypothermia   Hyperlipidemia   PAF (paroxysmal atrial fibrillation) (HCC)   COPD (chronic obstructive pulmonary disease) (HCC)   Tobacco abuse   Vertigo    Hospital Course: 66 year old female coming in with dizziness and slurred speech.  The patient played 9 holes of golf and did not eat prior.  While she had was a cup of coffee.  She stated she did drink some lemonade while out on the golf course.  She had a dizziness slurred speech and not feeling well.  In the emergency room she had a low temperature.  Patient was advised to eat prior to playing golf and stay hydrated.  Assessment and Plan: * Stroke-like symptom CT scan of the head was negative, CT angio of the head and neck was negative and MRI of the brain was negative.  Symptoms resolved and patient feeling better.  Patient was placed empirically on aspirin but this is not a stroke  Hypothermia Etiology is not clear.  Urine analysis negative.  Chest x-ray negative.  No signs of infection.  Temperature came up and no further symptoms  Hyperlipidemia Continue Crestor  COPD (chronic obstructive pulmonary disease) (HCC) Stable   PAF (paroxysmal atrial fibrillation) ([place]) Patient states that she had 1 episode of A-fib 10 years ago which did not relapse.  No arrhythmias on telemetry  Vertigo - Fall precaution -As needed meclizine         Consultants: Neurology Procedures performed: None Disposition: Home Diet recommendation:  Low-cholesterol diet DISCHARGE MEDICATION: Allergies as of 08/03/2022   No Known Allergies      Medication List     TAKE these medications     aspirin EC 81 MG tablet Take 1 tablet (81 mg total) by mouth daily. Swallow whole.   meclizine 25 MG tablet Commonly known as: ANTIVERT Take 1 tablet (25 mg total) by mouth daily as needed.   rosuvastatin 20 MG tablet Commonly known as: CRESTOR TAKE 1 TABLET BY MOUTH EVERY DAY        Follow-up Information     Biagio Borg, MD. Go on 08/05/2022.   Specialties: Internal Medicine, Radiology Why: '@1'$ :00pm Contact information: [place] [phone]                Discharge Exam: Danley Danker Weights   08/02/22 1327 08/03/22 0542  Weight: 79.4 kg 75.2 kg   Physical Exam HENT:     Head: Normocephalic.     Mouth/Throat:     Pharynx: No oropharyngeal exudate.  Eyes:     General: Lids are normal.     Conjunctiva/sclera: Conjunctivae normal.  Cardiovascular:     Rate and Rhythm: Normal rate and regular rhythm.     Heart sounds: Normal heart sounds, S1 normal and S2 normal.  Pulmonary:     Breath sounds: No decreased breath sounds, wheezing, rhonchi or rales.  Abdominal:     Palpations: Abdomen is soft.     Tenderness: There is no abdominal tenderness.  Musculoskeletal:     Right lower leg: No swelling.     Left lower leg: No swelling.  Skin:    General: Skin  is warm.     Findings: No rash.  Neurological:     Mental Status: She is alert and oriented to person, place, and time.     Comments: Power 5 out of 5 upper and lower extremity      Condition at discharge: stable  The results of significant diagnostics from this hospitalization (including imaging, microbiology, ancillary and laboratory) are listed below for reference.   Imaging Studies: MR BRAIN WO CONTRAST  Result Date: 08/02/2022 CLINICAL DATA:  Sudden onset dizziness, blurry vision, difficulty speaking. EXAM: MRI HEAD WITHOUT CONTRAST TECHNIQUE: Multiplanar, multiecho pulse sequences of the brain and surrounding structures were obtained without intravenous contrast.  COMPARISON:  Same-day CT/CTA head and neck FINDINGS: Brain: There is no acute intracranial hemorrhage, extra-axial fluid collection, or acute infarct. Parenchymal volume is normal. The ventricles are normal in size. Gray-white differentiation is preserved. Parenchymal signal is normal with no significant burden of chronic small vessel ischemic change. There is no mass lesion.  There is no mass effect or midline shift. Vascular: Normal flow voids. Skull and upper cervical spine: Normal marrow signal. Sinuses/Orbits: The paranasal sinuses are clear. The globes and orbits are unremarkable. Other: None. IMPRESSION: Normal brain MRI. Electronically Signed   By: Valetta Mole M.D.   On: 08/02/2022 16:32   DG Chest Portable 1 View  Result Date: 08/02/2022 CLINICAL DATA:  Weakness EXAM: PORTABLE CHEST 1 VIEW COMPARISON:  Chest 09/23/2015 FINDINGS: The heart size and mediastinal contours are within normal limits. Negative for infiltrate. Mild scarring lingula unchanged. The visualized skeletal structures are unremarkable. IMPRESSION: No active disease. Electronically Signed   By: Franchot Gallo M.D.   On: 08/02/2022 12:38   CT ANGIO HEAD NECK W WO CM (CODE STROKE)  Result Date: 08/02/2022 CLINICAL DATA:  The onset dizziness, blurred vision, difficulty speaking. EXAM: CT ANGIOGRAPHY HEAD AND NECK TECHNIQUE: Multidetector CT imaging of the head and neck was performed using the standard protocol during bolus administration of intravenous contrast. Multiplanar CT image reconstructions and MIPs were obtained to evaluate the vascular anatomy. Carotid stenosis measurements (when applicable) are obtained utilizing NASCET criteria, using the distal internal carotid diameter as the denominator. RADIATION DOSE REDUCTION: This exam was performed according to the departmental dose-optimization program which includes automated exposure control, adjustment of the mA and/or kV according to patient size and/or use of iterative  reconstruction technique. CONTRAST:  172m OMNIPAQUE IOHEXOL 350 MG/ML SOLN COMPARISON:  None Available. FINDINGS: CTA NECK FINDINGS Aortic arch: The imaged aortic arch is normal. The origins of the major branch vessels are patent. The subclavian arteries are patent to the level imaged. Right carotid system: The right common, internal, and external carotid arteries are patent, without hemodynamically significant stenosis or occlusion. There is no dissection or aneurysm. Left carotid system: The left common, internal, and external carotid arteries are patent, without hemodynamically significant stenosis or occlusion. There is no dissection or aneurysm. There is medialized course of the proximal left internal carotid artery. Vertebral arteries: The vertebral arteries are patent, without hemodynamically significant stenosis or occlusion. There is no dissection or aneurysm. Skeleton: There is mild multilevel degenerative change of the cervical spine. There is no acute osseous abnormality or suspicious osseous lesion. There is no visible canal hematoma. Other neck: The soft tissues of the neck are unremarkable. Upper chest: There is centrilobular emphysema in the lung apices. Review of the MIP images confirms the above findings CTA HEAD FINDINGS Anterior circulation: There is minimal calcified plaque in the left intracranial ICA without  hemodynamically significant stenosis or occlusion. The right intracranial ICA is patent The bilateral MCAs are patent. The bilateral ACAs are patent. There is no aneurysm or AVM. Posterior circulation: The bilateral V4 segments are patent. The basilar artery is patent. The major cerebellar artery origins are patent. The bilateral PCAs are patent. A right posterior communicating artery is identified. The left posterior communicating artery is not definitely seen. There is no aneurysm or AVM. Venous sinuses: Patent. Anatomic variants: None. Review of the MIP images confirms the above  findings IMPRESSION: Patent vasculature of the head and neck with no hemodynamically significant stenosis or occlusion. Emphysema. Electronically Signed   By: Valetta Mole M.D.   On: 08/02/2022 12:10   CT HEAD CODE STROKE WO CONTRAST  Result Date: 08/02/2022 CLINICAL DATA:  Code stroke.  Neuro deficit, acute, stroke suspected EXAM: CT HEAD WITHOUT CONTRAST TECHNIQUE: Contiguous axial images were obtained from the base of the skull through the vertex without intravenous contrast. RADIATION DOSE REDUCTION: This exam was performed according to the departmental dose-optimization program which includes automated exposure control, adjustment of the mA and/or kV according to patient size and/or use of iterative reconstruction technique. COMPARISON:  None Available. FINDINGS: Brain: No evidence of acute large vascular territory infarction, hemorrhage, hydrocephalus, extra-axial collection or mass lesion/mass effect. Vascular: No hyperdense vessel identified. Skull: No acute fracture Sinuses/Orbits: No acute findings. ASPECTS [facility] Stroke Program Early CT Score) total score (0-10 with 10 being normal): 10. IMPRESSION: 1. No evidence of acute intracranial abnormality. 2. ASPECTS is 10. Code stroke imaging results were communicated on 08/02/2022 at 11:39 am to provider Dr. Leonel Ramsay via secure text paging. Electronically Signed   By: Margaretha Sheffield M.D.   On: 08/02/2022 11:40    Microbiology: Results for orders placed or performed during the hospital encounter of 08/02/22  Resp Panel by RT-PCR (Flu A&B, Covid) Anterior Nasal Swab     Status: None   Collection Time: 08/02/22 12:05 PM   Specimen: Anterior Nasal Swab  Result Value Ref Range Status   SARS Coronavirus 2 by RT PCR NEGATIVE NEGATIVE Final    Comment: (NOTE) SARS-CoV-2 target nucleic acids are NOT DETECTED.  The SARS-CoV-2 RNA is generally detectable in upper respiratory specimens during the acute phase of infection. The lowest concentration  of SARS-CoV-2 viral copies this assay can detect is 138 copies/mL. A negative result does not preclude SARS-Cov-2 infection and should not be used as the sole basis for treatment or other patient management decisions. A negative result may occur with  improper specimen collection/handling, submission of specimen other than nasopharyngeal swab, presence of viral mutation(s) within the areas targeted by this assay, and inadequate number of viral copies(<138 copies/mL). A negative result must be combined with clinical observations, patient history, and epidemiological information. The expected result is Negative.  Fact Sheet for Patients:  EntrepreneurPulse.com.au  Fact Sheet for Healthcare Providers:  IncredibleEmployment.be  This test is no t yet approved or cleared by the [place] FDA and  has been authorized for detection and/or diagnosis of SARS-CoV-2 by FDA under an Emergency Use Authorization (EUA). This EUA will remain  in effect (meaning this test can be used) for the duration of the COVID-19 declaration under Section 564(b)(1) of the Act, 21 U.S.C.section 360bbb-3(b)(1), unless the authorization is terminated  or revoked sooner.       Influenza A by PCR NEGATIVE NEGATIVE Final   Influenza B by PCR NEGATIVE NEGATIVE Final    Comment: (NOTE) The Xpert Xpress SARS-CoV-2/FLU/RSV plus assay  is intended as an aid in the diagnosis of influenza from Nasopharyngeal swab specimens and should not be used as a sole basis for treatment. Nasal washings and aspirates are unacceptable for Xpert Xpress SARS-CoV-2/FLU/RSV testing.  Fact Sheet for Patients: EntrepreneurPulse.com.au  Fact Sheet for Healthcare Providers: IncredibleEmployment.be  This test is not yet approved or cleared by the [place] FDA and has been authorized for detection and/or diagnosis of SARS-CoV-2 by FDA under an Emergency Use  Authorization (EUA). This EUA will remain in effect (meaning this test can be used) for the duration of the COVID-19 declaration under Section 564(b)(1) of the Act, 21 U.S.C. section 360bbb-3(b)(1), unless the authorization is terminated or revoked.  Performed at [facility], [address] [place], [place] [address]     Labs: CBC: Recent Labs  Lab 08/02/22 1125  WBC 8.0  NEUTROABS 4.9  HGB 15.6*  HCT 46.5*  MCV 94.5  PLT 465   Basic Metabolic Panel: Recent Labs  Lab 08/02/22 1125 08/02/22 1144  NA 140  --   K 4.3  --   CL 109  --   CO2 21*  --   GLUCOSE 149*  --   BUN 18  --   CREATININE 0.80 0.80  CALCIUM 9.5  --    Liver Function Tests: Recent Labs  Lab 08/02/22 1125  AST 33  ALT 29  ALKPHOS 71  BILITOT 0.8  PROT 7.2  ALBUMIN 4.1   CBG: Recent Labs  Lab 08/02/22 1114  GLUCAP 145*    Discharge time spent: greater than 30 minutes.  Signed: Loletha Grayer, MD [facility] 08/03/2022

## 2022-08-03 NOTE — TOC Initial Note (Signed)
Transition of Care [facility]) - Initial/Assessment Note    Patient Details  Name: MARGHERITA COLLYER MRN: [id number] Date of Birth: 04-22-1956  Transition [facility] Care [facility]) CM/SW Contact:    Laurena Slimmer, RN Phone Number: 08/03/2022, 9:56 AM  Clinical Narrative:                  Transition of Care [facility]) Screening Note   Patient Details  Name: IDELIA CAUDELL Date of Birth: 12-24-1955   Transition of Care [facility]) CM/SW Contact:    Laurena Slimmer, RN Phone Number: 08/03/2022, 9:57 AM    Transition of Care Department [facility]) has reviewed patient and no TOC needs have been identified at this time. We will continue to monitor patient advancement through interdisciplinary progression rounds. If new patient transition needs arise, please place a TOC consult.          Patient Goals and CMS Choice        Expected Discharge Plan and Services           Expected Discharge Date: 08/03/22                                    Prior Living Arrangements/Services                       Activities of Daily Living Home Assistive Devices/Equipment: None ADL Screening (condition at time of admission) Patient's cognitive ability adequate to safely complete daily activities?: Yes Is the patient deaf or have difficulty hearing?: No Does the patient have difficulty seeing, even when wearing glasses/contacts?: No Does the patient have difficulty concentrating, remembering, or making decisions?: No Patient able to express need for assistance with ADLs?: Yes Does the patient have difficulty dressing or bathing?: No Independently performs ADLs?: Yes (appropriate for developmental age) Does the patient have difficulty walking or climbing stairs?: No Weakness of Legs: None Weakness of Arms/Hands: None  Permission Sought/Granted                  Emotional Assessment              Admission diagnosis:  Weakness [R53.1] Stroke-like symptom [R29.90] Patient Active  Problem List   Diagnosis Date Noted   Stroke-like symptom 08/02/2022   Tobacco abuse 08/02/2022   Hypothermia 08/02/2022   History of malignant neoplasm of skin 07/14/2021   Lentigo 07/14/2021   Actinic keratosis 07/14/2021   Thrombocytopenia ([place]) 01/07/2019   COPD (chronic obstructive pulmonary disease) ([place]) 05/18/2017   Hyperglycemia 05/17/2017   Centrilobular emphysema ([place]) 12/15/2016   Left wrist pain 06/15/2014   Smoker 01/31/2013   Abnormal liver function tests 01/31/2013   Meralgia paraesthetica, right 02/29/2012   PAF (paroxysmal atrial fibrillation) ([place]) 09/04/2011   Vertigo 09/04/2011   Encounter for well adult exam with abnormal findings 08/28/2011   BACK PAIN 03/18/2010   Vitamin D deficiency 06/03/2009   Hyperlipidemia 06/03/2009   GERD 06/03/2009   PCP:  Biagio Borg, MD Pharmacy:   [id number] [place] - [id number]: [phone]: [phone] [id number] [address]: [phone]: [phone]    Social Determinants of Health (SDOH) Interventions    Readmission Risk Interventions     No data  to display

## 2022-08-03 NOTE — Progress Notes (Addendum)
SLP Cancellation Note  Patient Details Name: Brittany Burton MRN: [id number] DOB: 1956/02/29   Cancelled treatment:       Reason Eval/Treat Not Completed: SLP screened, no needs identified, will sign off Per chart notes, Normal brain MRI. Pt denied any difficulty swallowing and is currently on a regular diet; tolerates swallowing pills w/ water per NSG. Breakfast arrived; pt eating/drinking w/out deficits noted. Pt conversed in conversation w/out expressive/receptive deficits noted; pt denied any speech-language deficits. Speech clear. She described the situation that led her to the ED w/ details.  No further skilled ST services indicated as pt appears at her baseline. Pt agreed. NSG to reconsult if any change in status while admitted.       Orinda Kenner, MS, CCC-SLP Speech Language Pathologist Rehab Services; [place] [phone] (ascom) Orley Lawry 08/03/2022, 8:09 AM

## 2022-08-05 ENCOUNTER — Ambulatory Visit: Payer: Medicare HMO | Admitting: Internal Medicine

## 2022-08-15 DIAGNOSIS — Z1231 Encounter for screening mammogram for malignant neoplasm of breast: Secondary | ICD-10-CM | POA: Diagnosis not present

## 2022-08-15 LAB — HM MAMMOGRAPHY

## 2022-08-25 NOTE — Progress Notes (Signed)
66 y.o. G0P0000 Married White or Caucasian Not Hispanic or Latino female here for annual exam. No vaginal bleeding. No dyspareunia.   She just lost a sister to ovarian cancer in 4/23. Her sister was diagnosed with ovarian cancer last year at 28. Negative genetic testing. She is one of 4 girls.    She had a kidney stone a few months ago, passed it on her own.   No bowel or bladder c/o.     No LMP recorded. Patient is postmenopausal.          Sexually active: Yes.    The current method of family planning is post menopausal status.    Exercising: Yes.     Goes to y and plays gold  Smoker:  yes 3/4 pack a day, no plans to quit.   Health Maintenance Pap 07/14/21: WNL, neg hpv; 06/21/2018 WNL History of abnormal Pap:  no MMG:  08/15/2022 at [facility] normal  BMD:   07/28/21 T score -1.8, FRAX 10/2.2 Colonoscopy: 12/12/17 every 10 years, normal.  TDaP:  01/07/2019 Gardasil: Na    reports that she has been smoking cigarettes. She has a 75.25 pack-year smoking history. She has never used smokeless tobacco. She reports that she does not drink alcohol and does not use drugs.Just occasional ETOH.  Past Medical History:  Diagnosis Date   BACK PAIN 03/18/2010   COPD (chronic obstructive pulmonary disease) ([place]) 05/18/2017   GERD 06/03/2009   HYPERLIPIDEMIA 06/03/2009   Kidney stone    LEG PAIN, RIGHT 03/18/2010   Osteopenia 06/2018   T score -1.8 FRAX 9.4% / 1.8%   PAF (paroxysmal atrial fibrillation) ([place]) 09/04/2011   POSTURAL LIGHTHEADEDNESS 06/17/2010   Vertigo    chronic recurrent, BPV, probl left related   VITAMIN D DEFICIENCY 06/03/2009    No past surgical history on file.  Current Outpatient Medications  Medication Sig Dispense Refill   aspirin 81 MG EC tablet Take 1 tablet (81 mg total) by mouth daily. Swallow whole. (Patient not taking: Reported on 08/02/2022) 30 tablet 12   meclizine (ANTIVERT) 25 MG tablet Take 1 tablet (25 mg total) by mouth daily as needed. 90 tablet 3    rosuvastatin (CRESTOR) 20 MG tablet TAKE 1 TABLET BY MOUTH EVERY DAY 90 tablet 3   No current facility-administered medications for this visit.    Family History  Problem Relation Age of Onset   Melanoma Father    Esophageal cancer Father    Ovarian cancer Sister 5       stage 23   Melanoma Sister    Cancer Paternal Grandmother        colon late 70's   Colon cancer Paternal Grandmother    Pancreatic cancer Neg Hx    Rectal cancer Neg Hx    Stomach cancer Neg Hx     Review of Systems  All other systems reviewed and are negative.   Exam:   There were no vitals taken for this visit.  Weight change: '@WEIGHTCHANGE'$ @ Height:      Ht Readings from Last 3 Encounters:  08/02/22 '5\' 5"'$  (1.651 m)  06/29/22 '5\' 5"'$  (1.651 m)  05/25/22 '5\' 5"'$  (1.651 m)    General appearance: alert, cooperative and appears stated age Head: Normocephalic, without obvious abnormality, atraumatic Neck: no adenopathy, supple, symmetrical, trachea midline and thyroid normal to inspection and palpation Breasts: normal appearance, no masses or tenderness Abdomen: soft, non-tender; non distended,  no masses,  no organomegaly Extremities: extremities normal, atraumatic, no cyanosis or  edema Skin: Skin color, texture, turgor normal. No rashes or lesions Lymph nodes: Cervical, supraclavicular, and axillary nodes normal. No abnormal inguinal nodes palpated Neurologic: Grossly normal   Pelvic: External genitalia:  no lesions              Urethra:  normal appearing urethra with no masses, tenderness or lesions              Bartholins and Skenes: normal                 Vagina: normal appearing vagina with normal color and discharge, no lesions              Cervix: no lesions               Bimanual Exam:  Uterus:   no masses or tenderness              Adnexa: no mass, fullness, tenderness               Rectovaginal: Confirms               Anus:  normal sphincter tone, no lesions  Gae Dry, CMA chaperoned for  the exam.  1. Encounter for gynecological examination without abnormal finding Discussed breast self exam No pap needed Mammogram and colonoscopy UTD Labs with primary F/U in 2 years  2. Osteopenia, unspecified location Discussed calcium and vit D intake - DG Bone Density; Future. Due in 9/24  3. Hypoestrogenism - DG Bone Density; Future

## 2022-08-26 ENCOUNTER — Encounter: Payer: Self-pay | Admitting: Internal Medicine

## 2022-08-31 ENCOUNTER — Encounter: Payer: Self-pay | Admitting: Obstetrics and Gynecology

## 2022-08-31 ENCOUNTER — Ambulatory Visit (INDEPENDENT_AMBULATORY_CARE_PROVIDER_SITE_OTHER): Payer: Medicare HMO | Admitting: Obstetrics and Gynecology

## 2022-08-31 VITALS — BP 118/72 | HR 66 | Ht 64.5 in | Wt 171.0 lb

## 2022-08-31 DIAGNOSIS — E2839 Other primary ovarian failure: Secondary | ICD-10-CM

## 2022-08-31 DIAGNOSIS — M8580 Other specified disorders of bone density and structure, unspecified site: Secondary | ICD-10-CM | POA: Diagnosis not present

## 2022-08-31 DIAGNOSIS — Z01419 Encounter for gynecological examination (general) (routine) without abnormal findings: Secondary | ICD-10-CM

## 2022-08-31 NOTE — Patient Instructions (Signed)

## 2022-09-30 DIAGNOSIS — Z0100 Encounter for examination of eyes and vision without abnormal findings: Secondary | ICD-10-CM | POA: Diagnosis not present

## 2022-10-19 ENCOUNTER — Telehealth: Payer: Self-pay | Admitting: Internal Medicine

## 2022-10-19 NOTE — Telephone Encounter (Signed)
Left message for patient to call back to schedule Medicare Annual Wellness Visit   No hx of AWV eligible as of 05/21/22  Please schedule at anytime with [facility] Advisor if patient calls the office back.     Any questions, please call me at [phone]

## 2022-11-21 ENCOUNTER — Encounter: Payer: Self-pay | Admitting: Internal Medicine

## 2022-11-22 MED ORDER — HYDROCODONE BIT-HOMATROP MBR 5-1.5 MG/5ML PO SOLN: 5.0000 mL | Freq: Four times a day (QID) | ORAL | 0 refills | Status: DC | PRN

## 2022-11-22 NOTE — Telephone Encounter (Signed)
Patient tested positive for covid on 12/27 and is requesting meds for cough

## 2022-11-22 NOTE — Telephone Encounter (Signed)
Ok for cough med prn - pt is out of the window for paxlovid

## 2022-11-24 ENCOUNTER — Ambulatory Visit: Payer: Medicare HMO

## 2022-11-24 ENCOUNTER — Ambulatory Visit: Payer: Medicare HMO | Admitting: Internal Medicine

## 2022-11-25 ENCOUNTER — Telehealth: Payer: Self-pay

## 2022-11-25 NOTE — Telephone Encounter (Signed)
Error

## 2022-11-26 ENCOUNTER — Encounter: Payer: Self-pay | Admitting: Internal Medicine

## 2022-11-28 MED ORDER — HYDROCODONE BIT-HOMATROP MBR 5-1.5 MG/5ML PO SOLN: 5.0000 mL | Freq: Four times a day (QID) | ORAL | 0 refills | Status: AC | PRN

## 2022-11-28 NOTE — Telephone Encounter (Signed)
Please send hycodan cough syrup to [facility] in [place] and patient will pay out of pocket

## 2022-11-28 NOTE — Telephone Encounter (Signed)
Ok this is done erx 

## 2022-12-01 ENCOUNTER — Encounter: Payer: Self-pay | Admitting: Internal Medicine

## 2022-12-05 ENCOUNTER — Telehealth: Payer: Self-pay

## 2022-12-05 MED ORDER — PROMETHAZINE-DM 6.25-15 MG/5ML PO SYRP: 5.0000 mL | ORAL_SOLUTION | Freq: Four times a day (QID) | ORAL | 0 refills | Status: DC | PRN

## 2022-12-05 NOTE — Telephone Encounter (Signed)
Pt pharmacy stated that pt Rx for HYDROcodone bit-homatropine (HYCODAN) 5-1.5 MG/5ML syrup  is on backordered. They have Promethazine DM or Guafenesin with codeine

## 2022-12-05 NOTE — Telephone Encounter (Signed)
Ok this is done 

## 2022-12-14 ENCOUNTER — Ambulatory Visit (INDEPENDENT_AMBULATORY_CARE_PROVIDER_SITE_OTHER): Payer: Medicare HMO

## 2022-12-14 VITALS — BP 118/80 | HR 56 | Temp 97.8°F | Ht 65.0 in | Wt 182.6 lb

## 2022-12-14 DIAGNOSIS — Z0000 Encounter for general adult medical examination without abnormal findings: Secondary | ICD-10-CM | POA: Diagnosis not present

## 2022-12-14 NOTE — Progress Notes (Signed)
Subjective:   Brittany Burton is a 67 y.o. female who presents for an Initial Medicare Annual Wellness Visit.  Review of Systems     Cardiac Risk Factors include: advanced age (>8mn, >>51women);dyslipidemia;obesity (BMI >30kg/m2);smoking/ tobacco exposure     Objective:    Today's Vitals   12/14/22 0847  BP: 118/80  Pulse: (!) 56  Temp: 97.8 F (36.6 C)  SpO2: 96%  Weight: 182 lb 9.6 oz (82.8 kg)  Height: '5\' 5"'$  (1.651 m)  PainSc: 0-No pain   Body mass index is 30.39 kg/m.     12/14/2022    8:54 AM 08/02/2022    6:00 PM 08/02/2022    1:27 PM 06/21/2017    8:51 AM  Advanced Directives  Does Patient Have a Medical Advance Directive? Yes No Unable to assess, patient is non-responsive or altered mental status Yes  Type of AScientist, forensicPower of [place]anLiving will   HLa Cres[place] will  Copy of [place] Chart? No - copy requested     Would patient like information on creating a medical advance directive?  No - Patient declined      Current Medications (verified) Outpatient Encounter Medications as of 12/14/2022  Medication Sig   aspirin 81 MG EC tablet Take 1 tablet (81 mg total) by mouth daily. Swallow whole.   meclizine (ANTIVERT) 25 MG tablet Take 1 tablet (25 mg total) by mouth daily as needed.   promethazine-dextromethorphan (PROMETHAZINE-DM) 6.25-15 MG/5ML syrup Take 5 mLs by mouth 4 (four) times daily as needed for cough.   rosuvastatin (CRESTOR) 20 MG tablet TAKE 1 TABLET BY MOUTH EVERY DAY   No facility-administered encounter medications on file as of 12/14/2022.    Allergies (verified) Patient has no known allergies.   History: Past Medical History:  Diagnosis Date   BACK PAIN 03/18/2010   COPD (chronic obstructive pulmonary disease) ([place] 05/18/2017   GERD 06/03/2009   HYPERLIPIDEMIA 06/03/2009   Kidney stone    LEG PAIN, RIGHT 03/18/2010   Osteopenia 06/2018   T score -1.8 FRAX 9.4% /  1.8%   PAF (paroxysmal atrial fibrillation) ([place] 09/04/2011   POSTURAL LIGHTHEADEDNESS 06/17/2010   Vertigo    chronic recurrent, BPV, probl left related   VITAMIN D DEFICIENCY 06/03/2009   History reviewed. No pertinent surgical history. Family History  Problem Relation Age of Onset   Melanoma Father    Esophageal cancer Father    Ovarian cancer Sister [phone]      stage 340  Melanoma Sister    Cancer Paternal Grandmother        colon late 70's   Colon cancer Paternal Grandmother    Pancreatic cancer Neg Hx    Rectal cancer Neg Hx    Stomach cancer Neg Hx    Social History   Socioeconomic History   Marital status: Married    Spouse name: Not on file   Number of children: 0   Years of education: Not on file   Highest education level: Not on file  Occupational History   Occupation: [employer]    Employer: [employer]  Tobacco Use   Smoking status: Every Day    Packs/day: 1.75    Years: 43.00    Total pack years: 75.25    Types: Cigarettes   Smokeless tobacco: Never  Vaping Use   Vaping Use: Never used  Substance and Sexual Activity   Alcohol use: No    Alcohol/week: 0.0 standard  drinks of alcohol   Drug use: No   Sexual activity: Yes    Birth control/protection: Post-menopausal    Comment: 1st intercourse 67 yo-Fewer than 5 partners,des neg  Other Topics Concern   Not on file  Social History Narrative   Not on file   Social Determinants of Health   Financial Resource Strain: Low Risk  (12/14/2022)   Overall Financial Resource Strain (CARDIA)    Difficulty of Paying Living Expenses: Not hard at all  Food Insecurity: No Food Insecurity (12/14/2022)   Hunger Vital Sign    Worried About Running Out of Food in the Last Year: Never true    Ran Out of Food in the Last Year: Never true  Transportation Needs: No Transportation Needs (12/14/2022)   PRAPARE - Hydrologist (Medical): No    Lack of Transportation (Non-Medical): No   Physical Activity: Sufficiently Active (12/14/2022)   Exercise Vital Sign    Days of Exercise per Week: 5 days    Minutes of Exercise per Session: 60 min  Stress: No Stress Concern Present (12/14/2022)   [place]    Feeling of Stress : Not at all  Social Connections: [place] (12/14/2022)   Social Connection and Isolation Panel [NHANES]    Frequency of Communication with Friends and Family: More than three times a week    Frequency of Social Gatherings with Friends and Family: More than three times a week    Attends Religious Services: More than 4 times per year    Active Member of [employer] or Organizations: Yes    Attends Music therapist: More than 4 times per year    Marital Status: Married    Tobacco Counseling Ready to quit: No Counseling given: Not Answered   Clinical Intake:  Pre-visit preparation completed: Yes  Pain : No/denies pain Pain Score: 0-No pain     BMI - recorded: 30.39 Nutritional Status: BMI > 30  Obese Nutritional Risks: None Diabetes: No CBG done?: No Did pt. bring in CBG monitor from home?: No  How often do you need to have someone help you when you read instructions, pamphlets, or other written materials from your doctor or pharmacy?: 1 - Never What is the last grade level you completed in school?: Bachelor's Degree  Diabetic? No  Interpreter Needed?: No  Information entered by :: Lisette Abu, LPN.   Activities of Daily Living    12/14/2022    9:08 AM 08/02/2022    6:00 PM  In your present state of health, do you have any difficulty performing the following activities:  Hearing? 0 0  Vision? 0 0  Difficulty concentrating or making decisions? 0 0  Walking or climbing stairs? 0 0  Dressing or bathing? 0 0  Doing errands, shopping? 0 0  Preparing Food and eating ? N   Using the Toilet? N   In the past six months, have you accidently leaked  urine? N   Do you have problems with loss of bowel control? N   Managing your Medications? N   Managing your Finances? N   Housekeeping or managing your Housekeeping? N     Patient Care Team: Biagio Borg, MD as PCP - General (Internal Medicine)  Indicate any recent Medical Services you may have received from other than [facility] providers in the past year (date may be approximate).     Assessment:   This is a routine  wellness examination for Brittany Burton.  Hearing/Vision screen Hearing Screening - Comments:: Denies hearing difficulties   Vision Screening - Comments:: Wears rx glasses - up to date with routine eye exams with Teodoro Spray, OD.   Dietary issues and exercise activities discussed: Current Exercise Habits: Home exercise routine, Type of exercise: walking;Other - see comments (golf), Time (Minutes): 60, Frequency (Times/Week): 5, Weekly Exercise (Minutes/Week): 300, Intensity: Moderate, Exercise limited by: respiratory conditions(s)   Goals Addressed             This Visit's Progress    My goal for 2024 is to lose 10 pounds.        Depression Screen    12/14/2022    9:06 AM 01/17/2022    1:16 PM 01/17/2022    1:02 PM 01/15/2021    9:26 AM 01/15/2021    8:58 AM 01/14/2020    1:22 PM 01/14/2020    1:05 PM  PHQ 2/9 Scores  PHQ - 2 Score 0 0 0 0 0 0 0    Fall Risk    12/14/2022    9:08 AM 01/17/2022    1:16 PM 01/17/2022    1:02 PM 01/15/2021    9:26 AM 01/14/2020    1:22 PM  [place] in the past year? 0 0 0 0 0  Number falls in past yr: 0 0 0    Injury with Fall? 0 0 0    Risk for fall due to : No Fall Risks      Follow up Falls prevention discussed        [place]:  Any stairs in or around the home? Yes  If so, are there any without handrails? No  Home free of loose throw rugs in walkways, pet beds, electrical cords, etc? Yes  Adequate lighting in your home to reduce risk of falls? Yes   ASSISTIVE DEVICES  UTILIZED TO PREVENT FALLS:  Life alert? No  Use of a cane, walker or w/c? No  Grab bars in the bathroom? No  Shower chair or bench in shower? No  Elevated toilet seat or a handicapped toilet? Yes   TIMED UP AND GO:  Was the test performed? Yes .  Length of time to ambulate 10 feet: 8 sec.   Gait steady and fast without use of assistive device  Cognitive Function:        12/14/2022    9:09 AM  6CIT Screen  What Year? 0 points  What month? 0 points  What time? 0 points  Count back from 20 0 points  Months in reverse 0 points  Repeat phrase 0 points  Total Score 0 points    Immunizations Immunization History  Administered Date(s) Administered   COVID-19, mRNA, vaccine(Comirnaty)12 years and older 09/07/2022   Fluad Quad(high Dose 65+) 09/07/2022   Influenza,inj,Quad PF,6+ Mos 08/28/2018, 08/02/2019, 09/03/2020   Influenza-Unspecified 09/16/2015, 08/26/2021   PFIZER(Purple Top)SARS-COV-2 Vaccination 01/27/2020, 02/26/2020, 09/10/2020, 08/26/2021   PNEUMOCOCCAL CONJUGATE-20 01/17/2022   Respiratory Syncytial Virus Vaccine,Recomb Aduvanted(Arexvy) 09/07/2022   Td 06/03/2009   Tdap 01/07/2019   Zoster Recombinat (Shingrix) 08/29/2019, 11/25/2019   Zoster, Live 02/04/2013    TDAP status: Up to date  Flu Vaccine status: Up to date  Pneumococcal vaccine status: Up to date  Covid-19 vaccine status: Completed vaccines  Qualifies for Shingles Vaccine? Yes   Zostavax completed Yes   Shingrix Completed?: Yes  Screening Tests Health Maintenance  Topic Date Due  COVID-19 Vaccine (6 - 2023-24 season) 11/02/2022   Lung Cancer Screening  05/26/2023   Medicare Annual Wellness (AWV)  12/15/2023   MAMMOGRAM  08/15/2024   COLONOSCOPY (Pts 45-56yr Insurance coverage will need to be confirmed)  12/13/2027   DTaP/Tdap/Td (3 - Td or Tdap) 01/07/2029   Pneumonia Vaccine 67 Years old  Completed   INFLUENZA VACCINE  Completed   DEXA SCAN  Completed   Hepatitis C Screening   Completed   Zoster Vaccines- Shingrix  Completed   HPV VACCINES  Aged Out    Health Maintenance  Health Maintenance Due  Topic Date Due   COVID-19 Vaccine (6 - 2023-24 season) 11/02/2022    Colorectal cancer screening: Type of screening: Colonoscopy. Completed 12/12/2017. Repeat every 10 years  Mammogram status: Completed 08/15/2022. Repeat every year  Bone Density status: Completed 07/28/2021. Results reflect: Bone density results: OSTEOPENIA. Repeat every 2 years. Scheduled for 08/15/2023  Lung Cancer Screening: (Low Dose CT Chest recommended if Age 266-80years, 30 pack-year currently smoking OR have quit w/in 15years.) does qualify.   Lung Cancer Screening Referral: completed 05/25/2022  Additional Screening:  Hepatitis C Screening: does qualify; Completed 01/31/2013  Vision Screening: Recommended annual ophthalmology exams for early detection of glaucoma and other disorders of the eye. Is the patient up to date with their annual eye exam?  Yes  Who is the provider or what is the name of the office in which the patient attends annual eye exams? [facility] OD. If pt is not established with a provider, would they like to be referred to a provider to establish care? No .   Dental Screening: Recommended annual dental exams for proper oral hygiene  Community Resource Referral / Chronic Care Management: CRR required this visit?  No   CCM required this visit?  No      Plan:     I have personally reviewed and noted the following in the patient's chart:   Medical and social history Use of alcohol, tobacco or illicit drugs  Current medications and supplements including opioid prescriptions. Patient is not currently taking opioid prescriptions. Functional ability and status Nutritional status Physical activity Advanced directives List of other physicians Hospitalizations, surgeries, and ER visits in previous 12 months Vitals Screenings to include cognitive, depression,  and falls Referrals and appointments  In addition, I have reviewed and discussed with patient certain preventive protocols, quality metrics, and best practice recommendations. A written personalized care plan for preventive services as well as general preventive health recommendations were provided to patient.     SSheral Flow LPN   15/28/4132  Nurse Notes:  Normal cognitive status assessed by direct observation by this Nurse Health Advisor. No abnormalities found.

## 2022-12-14 NOTE — Patient Instructions (Addendum)
Brittany Burton , Thank you for taking time to come for your Medicare Wellness Visit. I appreciate your ongoing commitment to your health goals. Please review the following plan we discussed and let me know if I can assist you in the future.   These are the goals we discussed:  Goals      My goal for 2024 is to lose 10 pounds.        This is a list of the screening recommended for you and due dates:  Health Maintenance  Topic Date Due   Medicare Annual Wellness Visit  Never done   COVID-19 Vaccine (6 - 2023-24 season) 11/02/2022   Screening for Lung Cancer  05/26/2023   Mammogram  08/15/2024   Colon Cancer Screening  12/13/2027   DTaP/Tdap/Td vaccine (3 - Td or Tdap) 01/07/2029   Pneumonia Vaccine  Completed   Flu Shot  Completed   DEXA scan (bone density measurement)  Completed   Hepatitis C Screening: USPSTF Recommendation to screen - Ages 12-79 yo.  Completed   Zoster (Shingles) Vaccine  Completed   HPV Vaccine  Aged Out    Advanced directives: Yes  Conditions/risks identified: Yes  Next appointment: Follow up in one year for your annual wellness visit.   Preventive Care 75 Years and Older, Female Preventive care refers to lifestyle choices and visits with your health care provider that can promote health and wellness. What does preventive care include? A yearly physical exam. This is also called an annual well check. Dental exams once or twice a year. Routine eye exams. Ask your health care provider how often you should have your eyes checked. Personal lifestyle choices, including: Daily care of your teeth and gums. Regular physical activity. Eating a healthy diet. Avoiding tobacco and drug use. Limiting alcohol use. Practicing safe sex. Taking low-dose aspirin every day. Taking vitamin and mineral supplements as recommended by your health care provider. What happens during an annual well check? The services and screenings done by your health care provider during  your annual well check will depend on your age, overall health, lifestyle risk factors, and family history of disease. Counseling  Your health care provider may ask you questions about your: Alcohol use. Tobacco use. Drug use. Emotional well-being. Home and relationship well-being. Sexual activity. Eating habits. History of falls. Memory and ability to understand (cognition). Work and work Statistician. Reproductive health. Screening  You may have the following tests or measurements: Height, weight, and BMI. Blood pressure. Lipid and cholesterol levels. These may be checked every 5 years, or more frequently if you are over 2 years old. Skin check. Lung cancer screening. You may have this screening every year starting at age 54 if you have a 30-pack-year history of smoking and currently smoke or have quit within the past 15 years. Fecal occult blood test (FOBT) of the stool. You may have this test every year starting at age 70. Flexible sigmoidoscopy or colonoscopy. You may have a sigmoidoscopy every 5 years or a colonoscopy every 10 years starting at age 42. Hepatitis C blood test. Hepatitis B blood test. Sexually transmitted disease (STD) testing. Diabetes screening. This is done by checking your blood sugar (glucose) after you have not eaten for a while (fasting). You may have this done every 1-3 years. Bone density scan. This is done to screen for osteoporosis. You may have this done starting at age 36. Mammogram. This may be done every 1-2 years. Talk to your health care provider about how often  you should have regular mammograms. Talk with your health care provider about your test results, treatment options, and if necessary, the need for more tests. Vaccines  Your health care provider may recommend certain vaccines, such as: Influenza vaccine. This is recommended every year. Tetanus, diphtheria, and acellular pertussis (Tdap, Td) vaccine. You may need a Td booster every 10  years. Zoster vaccine. You may need this after age 71. Pneumococcal 13-valent conjugate (PCV13) vaccine. One dose is recommended after age 58. Pneumococcal polysaccharide (PPSV23) vaccine. One dose is recommended after age 15. Talk to your health care provider about which screenings and vaccines you need and how often you need them. This information is not intended to replace advice given to you by your health care provider. Make sure you discuss any questions you have with your health care provider. Document Released: 12/04/2015 Document Revised: 07/27/2016 Document Reviewed: 09/08/2015 Elsevier Interactive Patient Education  2017 [place] Prevention in the Home Falls can cause injuries. They can happen to people of all ages. There are many things you can do to make your home safe and to help prevent falls. What can I do on the outside of my home? Regularly fix the edges of walkways and driveways and fix any cracks. Remove anything that might make you trip as you walk through a door, such as a raised step or threshold. Trim any bushes or trees on the path to your home. Use bright outdoor lighting. Clear any walking paths of anything that might make someone trip, such as rocks or tools. Regularly check to see if handrails are loose or broken. Make sure that both sides of any steps have handrails. Any raised decks and porches should have guardrails on the edges. Have any leaves, snow, or ice cleared regularly. Use sand or salt on walking paths during winter. Clean up any spills in your garage right away. This includes oil or grease spills. What can I do in the bathroom? Use night lights. Install grab bars by the toilet and in the tub and shower. Do not use towel bars as grab bars. Use non-skid mats or decals in the tub or shower. If you need to sit down in the shower, use a plastic, non-slip stool. Keep the floor dry. Clean up any water that spills on the floor as soon as it  happens. Remove soap buildup in the tub or shower regularly. Attach bath mats securely with double-sided non-slip rug tape. Do not have throw rugs and other things on the floor that can make you trip. What can I do in the bedroom? Use night lights. Make sure that you have a light by your bed that is easy to reach. Do not use any sheets or blankets that are too big for your bed. They should not hang down onto the floor. Have a firm chair that has side arms. You can use this for support while you get dressed. Do not have throw rugs and other things on the floor that can make you trip. What can I do in the kitchen? Clean up any spills right away. Avoid walking on wet floors. Keep items that you use a lot in easy-to-reach places. If you need to reach something above you, use a strong step stool that has a grab bar. Keep electrical cords out of the way. Do not use floor polish or wax that makes floors slippery. If you must use wax, use non-skid floor wax. Do not have throw rugs and other things on  the floor that can make you trip. What can I do with my stairs? Do not leave any items on the stairs. Make sure that there are handrails on both sides of the stairs and use them. Fix handrails that are broken or loose. Make sure that handrails are as long as the stairways. Check any carpeting to make sure that it is firmly attached to the stairs. Fix any carpet that is loose or worn. Avoid having throw rugs at the top or bottom of the stairs. If you do have throw rugs, attach them to the floor with carpet tape. Make sure that you have a light switch at the top of the stairs and the bottom of the stairs. If you do not have them, ask someone to add them for you. What else can I do to help prevent falls? Wear shoes that: Do not have high heels. Have rubber bottoms. Are comfortable and fit you well. Are closed at the toe. Do not wear sandals. If you use a stepladder: Make sure that it is fully opened.  Do not climb a closed stepladder. Make sure that both sides of the stepladder are locked into place. Ask someone to hold it for you, if possible. Clearly mark and make sure that you can see: Any grab bars or handrails. First and last steps. Where the edge of each step is. Use tools that help you move around (mobility aids) if they are needed. These include: Canes. Walkers. Scooters. Crutches. Turn on the lights when you go into a dark area. Replace any light bulbs as soon as they burn out. Set up your furniture so you have a clear path. Avoid moving your furniture around. If any of your floors are uneven, fix them. If there are any pets around you, be aware of where they are. Review your medicines with your doctor. Some medicines can make you feel dizzy. This can increase your chance of falling. Ask your doctor what other things that you can do to help prevent falls. This information is not intended to replace advice given to you by your health care provider. Make sure you discuss any questions you have with your health care provider. Document Released: 09/03/2009 Document Revised: 04/14/2016 Document Reviewed: 12/12/2014 Elsevier Interactive Patient Education  2017 [employer].

## 2023-01-16 ENCOUNTER — Other Ambulatory Visit (INDEPENDENT_AMBULATORY_CARE_PROVIDER_SITE_OTHER): Payer: Medicare HMO

## 2023-01-16 DIAGNOSIS — E785 Hyperlipidemia, unspecified: Secondary | ICD-10-CM

## 2023-01-16 DIAGNOSIS — E538 Deficiency of other specified B group vitamins: Secondary | ICD-10-CM | POA: Diagnosis not present

## 2023-01-16 DIAGNOSIS — E559 Vitamin D deficiency, unspecified: Secondary | ICD-10-CM

## 2023-01-16 DIAGNOSIS — R739 Hyperglycemia, unspecified: Secondary | ICD-10-CM | POA: Diagnosis not present

## 2023-01-16 LAB — HEPATIC FUNCTION PANEL
ALT: 25 U/L (ref 0–35)
AST: 29 U/L (ref 0–37)
Albumin: 3.8 g/dL (ref 3.5–5.2)
Alkaline Phosphatase: 86 U/L (ref 39–117)
Bilirubin, Direct: 0.2 mg/dL (ref 0.0–0.3)
Total Bilirubin: 0.8 mg/dL (ref 0.2–1.2)
Total Protein: 6.8 g/dL (ref 6.0–8.3)

## 2023-01-16 LAB — URINALYSIS, ROUTINE W REFLEX MICROSCOPIC
Bilirubin Urine: NEGATIVE
Hgb urine dipstick: NEGATIVE
Ketones, ur: NEGATIVE
Leukocytes,Ua: NEGATIVE
Nitrite: NEGATIVE
Specific Gravity, Urine: >=1.03 — AB (ref 1.000–1.030)
Total Protein, Urine: NEGATIVE
Urine Glucose: NEGATIVE
Urobilinogen, UA: 0.2 (ref 0.0–1.0)
pH: 6 (ref 5.0–8.0)

## 2023-01-16 LAB — LIPID PANEL
Cholesterol: 143 mg/dL (ref 0–200)
HDL: 55.9 mg/dL (ref >39.00)
LDL Cholesterol: 73 mg/dL (ref 0–99)
NonHDL: 86.99
Total CHOL/HDL Ratio: 3
Triglycerides: 70 mg/dL (ref 0.0–149.0)
VLDL: 14 mg/dL (ref 0.0–40.0)

## 2023-01-16 LAB — TSH: TSH: 4.49 u[IU]/mL (ref 0.35–5.50)

## 2023-01-16 LAB — CBC WITH DIFFERENTIAL/PLATELET
Basophils Absolute: 0 10*3/uL (ref 0.0–0.1)
Basophils Relative: 0.6 % (ref 0.0–3.0)
Eosinophils Absolute: 0.1 10*3/uL (ref 0.0–0.7)
Eosinophils Relative: 1.1 % (ref 0.0–5.0)
HCT: 43.8 % (ref 36.0–46.0)
Hemoglobin: 14.9 g/dL (ref 12.0–15.0)
Lymphocytes Relative: 35.2 % (ref 12.0–46.0)
Lymphs Abs: 1.7 10*3/uL (ref 0.7–4.0)
MCHC: 34 g/dL (ref 30.0–36.0)
MCV: 95.5 fl (ref 78.0–100.0)
Monocytes Absolute: 0.5 10*3/uL (ref 0.1–1.0)
Monocytes Relative: 9.8 % (ref 3.0–12.0)
Neutro Abs: 2.6 10*3/uL (ref 1.4–7.7)
Neutrophils Relative %: 53.3 % (ref 43.0–77.0)
Platelets: 208 10*3/uL (ref 150.0–400.0)
RBC: 4.59 Mil/uL (ref 3.87–5.11)
RDW: 13.1 % (ref 11.5–15.5)
WBC: 4.9 10*3/uL (ref 4.0–10.5)

## 2023-01-16 LAB — BASIC METABOLIC PANEL WITH GFR
BUN: 15 mg/dL (ref 6–23)
CO2: 27 meq/L (ref 19–32)
Calcium: 9.7 mg/dL (ref 8.4–10.5)
Chloride: 106 meq/L (ref 96–112)
Creatinine, Ser: 0.69 mg/dL (ref 0.40–1.20)
GFR: 90.34 mL/min
Glucose, Bld: 98 mg/dL (ref 70–99)
Potassium: 4.5 meq/L (ref 3.5–5.1)
Sodium: 140 meq/L (ref 135–145)

## 2023-01-16 LAB — HEMOGLOBIN A1C: Hgb A1c MFr Bld: 5.7 % (ref 4.6–6.5)

## 2023-01-16 LAB — VITAMIN D 25 HYDROXY (VIT D DEFICIENCY, FRACTURES): VITD: 26.39 ng/mL — ABNORMAL LOW (ref 30.00–100.00)

## 2023-01-16 LAB — VITAMIN B12: Vitamin B-12: 364 pg/mL (ref 211–911)

## 2023-01-23 ENCOUNTER — Ambulatory Visit (INDEPENDENT_AMBULATORY_CARE_PROVIDER_SITE_OTHER): Payer: Medicare HMO | Admitting: Internal Medicine

## 2023-01-23 VITALS — BP 130/80 | HR 71 | Temp 97.8°F | Ht 65.0 in | Wt 180.0 lb

## 2023-01-23 DIAGNOSIS — E559 Vitamin D deficiency, unspecified: Secondary | ICD-10-CM

## 2023-01-23 DIAGNOSIS — M5416 Radiculopathy, lumbar region: Secondary | ICD-10-CM | POA: Diagnosis not present

## 2023-01-23 DIAGNOSIS — E785 Hyperlipidemia, unspecified: Secondary | ICD-10-CM | POA: Diagnosis not present

## 2023-01-23 DIAGNOSIS — R739 Hyperglycemia, unspecified: Secondary | ICD-10-CM

## 2023-01-23 DIAGNOSIS — E538 Deficiency of other specified B group vitamins: Secondary | ICD-10-CM

## 2023-01-23 DIAGNOSIS — F17200 Nicotine dependence, unspecified, uncomplicated: Secondary | ICD-10-CM

## 2023-01-23 DIAGNOSIS — R69 Illness, unspecified: Secondary | ICD-10-CM | POA: Diagnosis not present

## 2023-01-23 DIAGNOSIS — Z0001 Encounter for general adult medical examination with abnormal findings: Secondary | ICD-10-CM | POA: Diagnosis not present

## 2023-01-23 MED ORDER — GABAPENTIN 100 MG PO CAPS: 100.0000 mg | ORAL_CAPSULE | Freq: Three times a day (TID) | ORAL | 5 refills | Status: DC

## 2023-01-23 NOTE — Patient Instructions (Signed)
Please take all new medication as prescribed  - the gabapentin 100 mg three times per day  Please take OTC Vitamin D3 at 2000 units per day, indefinitely  Please continue all other medications as before, and refills have been done if requested.  Please have the pharmacy call with any other refills you may need.  Please continue your efforts at being more active, low cholesterol diet, and weight control.  You are otherwise up to date with prevention measures today.  Please keep your appointments with your specialists as you may have planned  You will be contacted regarding the referral for: MRI for the lower back  Please make an Appointment to return for your 1 year visit, or sooner if needed, with Lab testing by Appointment as well, to be done about 3-5 days before at the [facility] (so this is for TWO appointments - please see the scheduling desk as you leave)

## 2023-01-23 NOTE — Progress Notes (Unsigned)
Patient ID: Brittany Burton, female   DOB: [birth year], 67 y.o.   MRN: [id number]         Chief Complaint:: wellness exam and Physical (Numbness/burning of right thigh when walking and standing)  , low vit d, hyperglycemia, hld, smoker, low vit d       HPI:  Brittany Burton is a 67 y.o. female here for wellness exam; declines covid booster, o'w up to date  Still smoking, not ready to quit                        Also s/p hospn sept 12-13 2023 with stroke like symptoms of dizziness and slurred speech, eval neg and started asa 81 mg, had no proven acute stroke.  .  Did have covid infection in jan 2024 river cruise.  Pt denies chest pain, increased sob or doe, wheezing, orthopnea, PND, increased LE swelling, palpitations, dizziness or syncope.   Pt denies polydipsia, polyuria, or new focal neuro s/s.    Pt denies fever, wt loss, night sweats, loss of appetite, or other constitutional symptoms But does have new worsening right lower back pain with radiation to the RLE with numbness and weakness distal mild but pain can be severe. Has had to lift leg into the car, but no falls.     Wt Readings from Last 3 Encounters:  01/23/23 180 lb (81.6 kg)  12/14/22 182 lb 9.6 oz (82.8 kg)  08/31/22 171 lb (77.6 kg)   BP Readings from Last 3 Encounters:  01/23/23 130/80  12/14/22 118/80  08/31/22 118/72   Immunization History  Administered Date(s) Administered   COVID-19, mRNA, vaccine(Comirnaty)12 years and older 09/07/2022   Fluad Quad(high Dose 65+) 09/07/2022   Influenza,inj,Quad PF,6+ Mos 08/28/2018, 08/02/2019, 09/03/2020   Influenza-Unspecified 09/16/2015, 08/26/2021   PFIZER(Purple Top)SARS-COV-2 Vaccination 01/27/2020, 02/26/2020, 09/10/2020, 08/26/2021   PNEUMOCOCCAL CONJUGATE-20 01/17/2022   Respiratory Syncytial Virus Vaccine,Recomb Aduvanted(Arexvy) 09/07/2022   Td 06/03/2009   Tdap 01/07/2019   Zoster Recombinat (Shingrix) 08/29/2019, 11/25/2019   Zoster, Live 02/04/2013  There are no  preventive care reminders to display for this patient.    Past Medical History:  Diagnosis Date   BACK PAIN 03/18/2010   COPD (chronic obstructive pulmonary disease) ([place]) 05/18/2017   GERD 06/03/2009   HYPERLIPIDEMIA 06/03/2009   Kidney stone    LEG PAIN, RIGHT 03/18/2010   Osteopenia 06/2018   T score -1.8 FRAX 9.4% / 1.8%   PAF (paroxysmal atrial fibrillation) ([place]) 09/04/2011   POSTURAL LIGHTHEADEDNESS 06/17/2010   Vertigo    chronic recurrent, BPV, probl left related   VITAMIN D DEFICIENCY 06/03/2009   History reviewed. No pertinent surgical history.  reports that she has been smoking cigarettes. She has a 75.25 pack-year smoking history. She has never used smokeless tobacco. She reports that she does not drink alcohol and does not use drugs. family history includes Cancer in her paternal grandmother; Colon cancer in her paternal grandmother; Esophageal cancer in her father; Melanoma in her father and sister; Ovarian cancer (age of onset: 39) in her sister. No Known Allergies Current Outpatient Medications on File Prior to Visit  Medication Sig Dispense Refill   aspirin 81 MG EC tablet Take 1 tablet (81 mg total) by mouth daily. Swallow whole. 30 tablet 12   meclizine (ANTIVERT) 25 MG tablet Take 1 tablet (25 mg total) by mouth daily as needed. 90 tablet 3   promethazine-dextromethorphan (PROMETHAZINE-DM) 6.25-15 MG/5ML syrup Take 5 mLs by mouth 4 (  four) times daily as needed for cough. 118 mL 0   rosuvastatin (CRESTOR) 20 MG tablet TAKE 1 TABLET BY MOUTH EVERY DAY 90 tablet 3   No current facility-administered medications on file prior to visit.        ROS:  All others reviewed and negative.  Objective        PE:  BP 130/80 (BP Location: Left Arm, Patient Position: Sitting, Cuff Size: Large)   Pulse 71   Temp 97.8 F (36.6 C) (Oral)   Ht '5\' 5"'$  (1.651 m)   Wt 180 lb (81.6 kg)   SpO2 96%   BMI 29.95 kg/m                 Constitutional: Pt appears in NAD                HENT: Head: NCAT.                Right Ear: External ear normal.                 Left Ear: External ear normal.                Eyes: . Pupils are equal, round, and reactive to light. Conjunctivae and EOM are normal               Nose: without d/c or deformity               Neck: Neck supple. Gross normal ROM               Cardiovascular: Normal rate and regular rhythm.                 Pulmonary/Chest: Effort normal and breath sounds without rales or wheezing.                Abd:  Soft, NT, ND, + BS, no organomegaly               Neurological: Pt is alert. At baseline orientation, motor with 4/5 RLE weakness               Skin: Skin is warm. No rashes, no other new lesions, LE edema - none               Psychiatric: Pt behavior is normal without agitation   Micro: none  Cardiac tracings I have personally interpreted today:  none  Pertinent Radiological findings (summarize): none   Lab Results  Component Value Date   WBC 4.9 01/16/2023   HGB 14.9 01/16/2023   HCT 43.8 01/16/2023   PLT 208.0 01/16/2023   GLUCOSE 98 01/16/2023   CHOL 143 01/16/2023   TRIG 70.0 01/16/2023   HDL 55.90 01/16/2023   LDLDIRECT 145.2 01/28/2013   LDLCALC 73 01/16/2023   ALT 25 01/16/2023   AST 29 01/16/2023   NA 140 01/16/2023   K 4.5 01/16/2023   CL 106 01/16/2023   CREATININE 0.69 01/16/2023   BUN 15 01/16/2023   CO2 27 01/16/2023   TSH 4.49 01/16/2023   INR 1.0 08/02/2022   HGBA1C 5.7 01/16/2023   Assessment/Plan:  Brittany Burton is a 67 y.o. White or Caucasian [1] female with  has a past medical history of BACK PAIN (03/18/2010), COPD (chronic obstructive pulmonary disease) ([place]) (05/18/2017), GERD (06/03/2009), HYPERLIPIDEMIA (06/03/2009), Kidney stone, LEG PAIN, RIGHT (03/18/2010), Osteopenia (06/2018), PAF (paroxysmal atrial fibrillation) ([place]) (09/04/2011), POSTURAL LIGHTHEADEDNESS (06/17/2010), Vertigo, and VITAMIN D DEFICIENCY (06/03/2009).  Encounter  for well adult exam with  abnormal findings Age and sex appropriate education and counseling updated with regular exercise and diet Referrals for preventative services - none needed Immunizations addressed - declines covid booster Smoking counseling  - pt counsled to quit, pt not ready Evidence for depression or other mood disorder - none significant Most recent labs reviewed. I have personally reviewed and have noted: 1) the patient's medical and social history 2) The patient's current medications and supplements 3) The patient's height, weight, and BMI have been recorded in the chart   Hyperglycemia Lab Results  Component Value Date   HGBA1C 5.7 01/16/2023   Stable, pt to continue current medical treatment  - diet, wt control   Hyperlipidemia Lab Results  Component Value Date   LDLCALC 73 01/16/2023   Uncontrolled, goal ldl < 70, pt to continue current statin crestor 20 g qd and lower chol diet, declines other change today   Smoker Pt counseled to quit, pt not ready  Vitamin D deficiency Last vitamin D Lab Results  Component Value Date   VD25OH 26.39 (L) 01/16/2023   Low, to start oral replacement   Right lumbar radiculopathy New onset x 2 wks with neuro change and mod to severe pain, for MRI LS spine, gabapentin 100 tid, consider surgical referral pending results  Followup: Return in about 1 year (around 01/23/2024).  Cathlean Cower, MD 01/24/2023 9:02 PM [facility]

## 2023-01-24 ENCOUNTER — Encounter: Payer: Self-pay | Admitting: Internal Medicine

## 2023-01-24 NOTE — Assessment & Plan Note (Signed)
Lab Results  Component Value Date   HGBA1C 5.7 01/16/2023   Stable, pt to continue current medical treatment  - diet, wt control

## 2023-01-24 NOTE — Assessment & Plan Note (Signed)
Pt counseled to quit, pt not ready 

## 2023-01-24 NOTE — Assessment & Plan Note (Signed)
Last vitamin D Lab Results  Component Value Date   VD25OH 26.39 (L) 01/16/2023   Low, to start oral replacement

## 2023-01-24 NOTE — Assessment & Plan Note (Signed)
Lab Results  Component Value Date   LDLCALC 73 01/16/2023   Uncontrolled, goal ldl < 70, pt to continue current statin crestor 20 g qd and lower chol diet, declines other change today

## 2023-01-24 NOTE — Assessment & Plan Note (Signed)
Age and sex appropriate education and counseling updated with regular exercise and diet Referrals for preventative services - none needed Immunizations addressed - declines covid booster Smoking counseling  - pt counsled to quit, pt not ready Evidence for depression or other mood disorder - none significant Most recent labs reviewed. I have personally reviewed and have noted: 1) the patient's medical and social history 2) The patient's current medications and supplements 3) The patient's height, weight, and BMI have been recorded in the chart

## 2023-01-24 NOTE — Assessment & Plan Note (Signed)
New onset x 2 wks with neuro change and mod to severe pain, for MRI LS spine, gabapentin 100 tid, consider surgical referral pending results

## 2023-02-08 ENCOUNTER — Ambulatory Visit
Admission: RE | Admit: 2023-02-08 | Discharge: 2023-02-08 | Disposition: A | Discharge status: Home / Self Care | Payer: Medicare HMO | Source: Ambulatory Visit | Attending: Internal Medicine | Admitting: Internal Medicine

## 2023-02-08 DIAGNOSIS — M545 Low back pain, unspecified: Secondary | ICD-10-CM | POA: Diagnosis not present

## 2023-02-08 DIAGNOSIS — M5416 Radiculopathy, lumbar region: Secondary | ICD-10-CM

## 2023-02-08 DIAGNOSIS — M47816 Spondylosis without myelopathy or radiculopathy, lumbar region: Secondary | ICD-10-CM | POA: Diagnosis not present

## 2023-02-08 DIAGNOSIS — R202 Paresthesia of skin: Secondary | ICD-10-CM | POA: Diagnosis not present

## 2023-02-08 DIAGNOSIS — R200 Anesthesia of skin: Secondary | ICD-10-CM | POA: Diagnosis not present

## 2023-02-13 ENCOUNTER — Encounter: Payer: Self-pay | Admitting: Internal Medicine

## 2023-02-13 DIAGNOSIS — M545 Low back pain, unspecified: Secondary | ICD-10-CM

## 2023-02-13 NOTE — Addendum Note (Signed)
Addended by: Biagio Borg on: 02/13/2023 04:46 PM   Modules accepted: Orders

## 2023-02-13 NOTE — Telephone Encounter (Signed)
Pt requesting referral to [facility]...Brittany Burton

## 2023-02-27 DIAGNOSIS — M7061 Trochanteric bursitis, right hip: Secondary | ICD-10-CM | POA: Diagnosis not present

## 2023-02-27 DIAGNOSIS — M533 Sacrococcygeal disorders, not elsewhere classified: Secondary | ICD-10-CM | POA: Diagnosis not present

## 2023-03-01 ENCOUNTER — Other Ambulatory Visit: Payer: Self-pay | Admitting: Orthopedic Surgery

## 2023-03-01 DIAGNOSIS — M545 Low back pain, unspecified: Secondary | ICD-10-CM

## 2023-03-02 IMAGING — CT CT CHEST LUNG CANCER SCREENING LOW DOSE W/O CM
2 of 5 series · 15 of 40 positions shown, 18 images · non-contrast
Comparison: 05/08/2020

CLINICAL DATA: 64-year-old female with 74 pack-year history of
smoking. Lung cancer screening.

EXAM:
CT CHEST WITHOUT CONTRAST LOW-DOSE FOR LUNG CANCER SCREENING
TECHNIQUE: Multidetector CT imaging of the chest was performed following the
standard protocol without IV contrast.

[Series 4: lung 1.00 br44 cor · coronal · 0.63mm/px · 3 of 347 slices shown]
[im 70/347  lung]
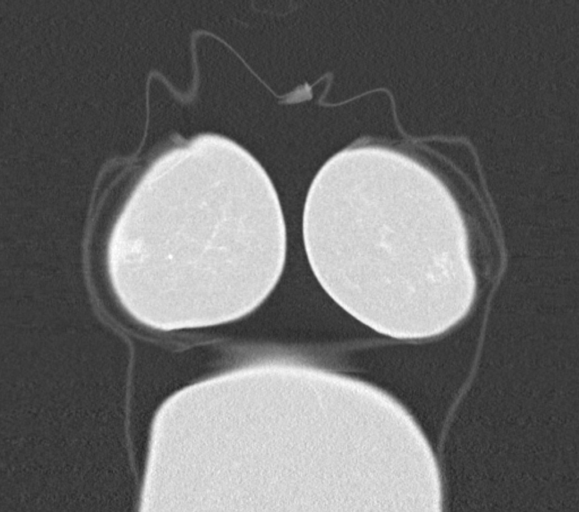
[im 139/347  lung]
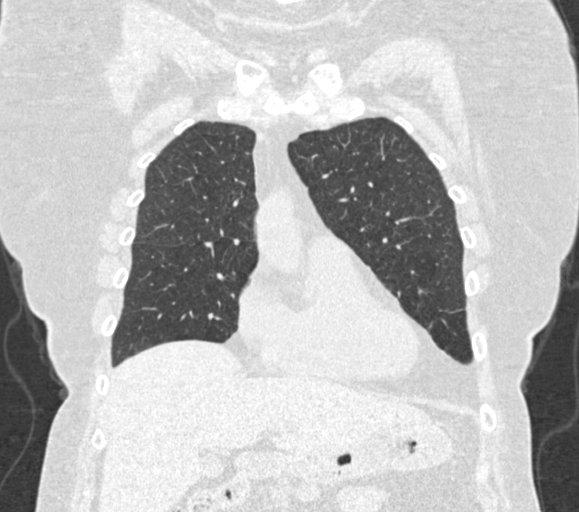
[im 208/347  lung]
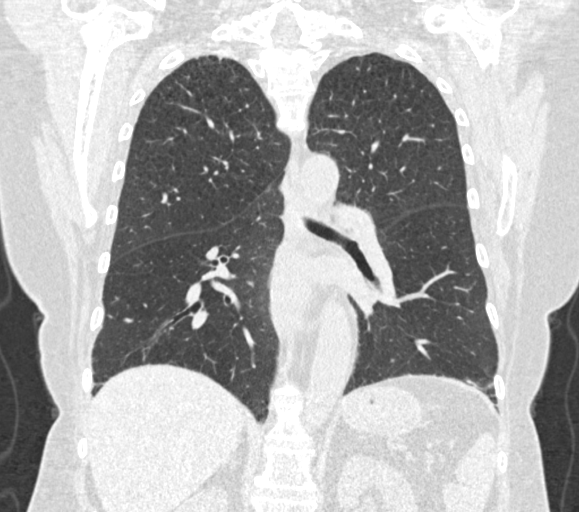

[Series 9: lung 1.00 br60 axial · axial · 0.72mm/px · z∈[-1243,-950]mm · 12 of 325 slices shown, 15 images]
[im 16/325  mediastinal]
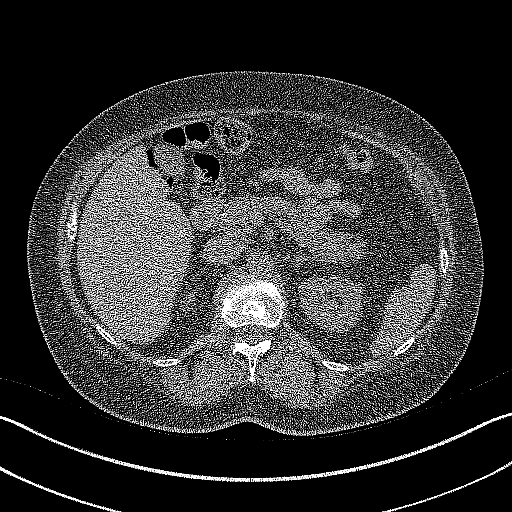
[im 16/325  lung]
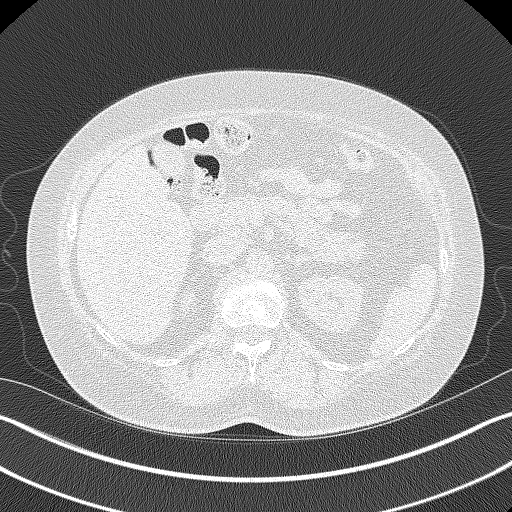
[im 47/325  lung]
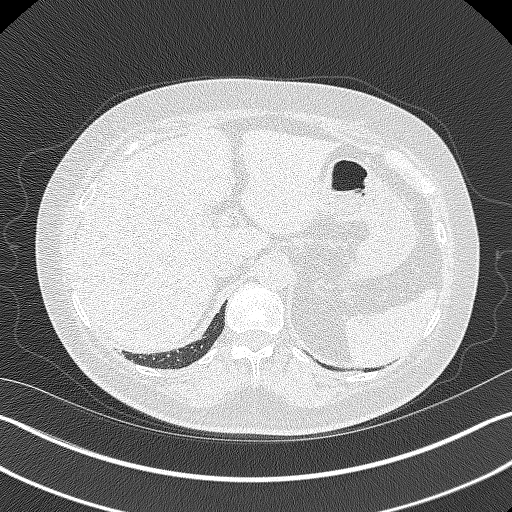
[im 78/325  lung]
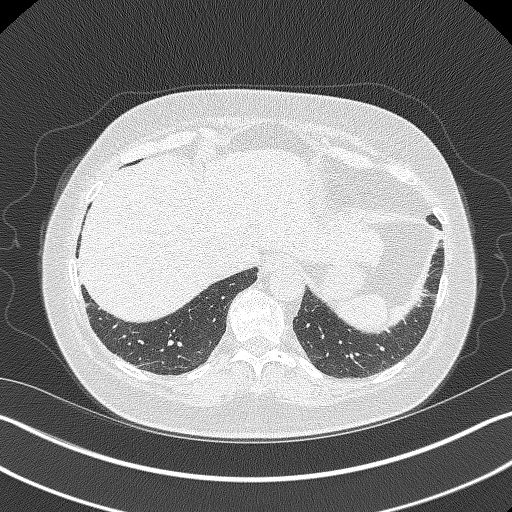
[im 93/325  lung]
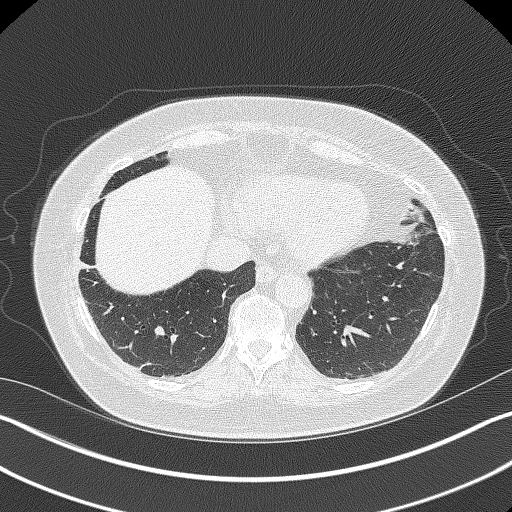
[im 124/325  mediastinal]
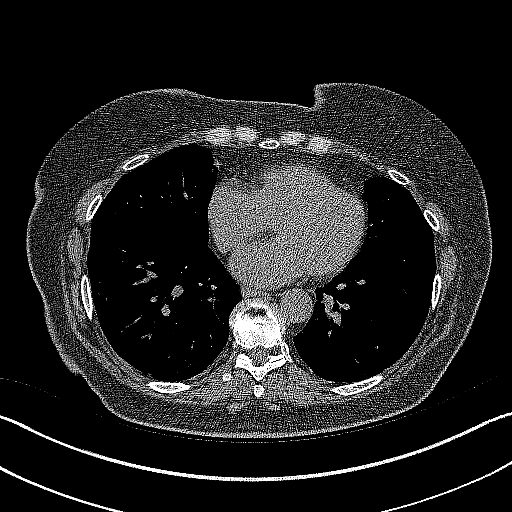
[im 124/325  lung]
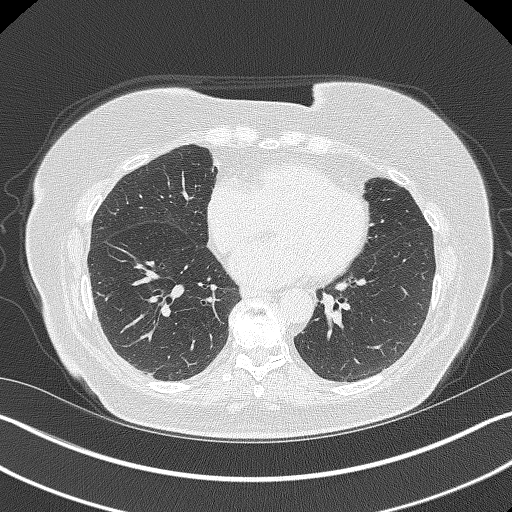
[im 155/325  lung]
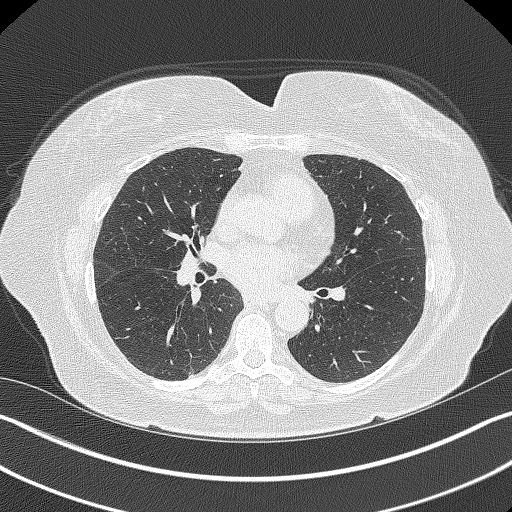
[im 170/325  lung]
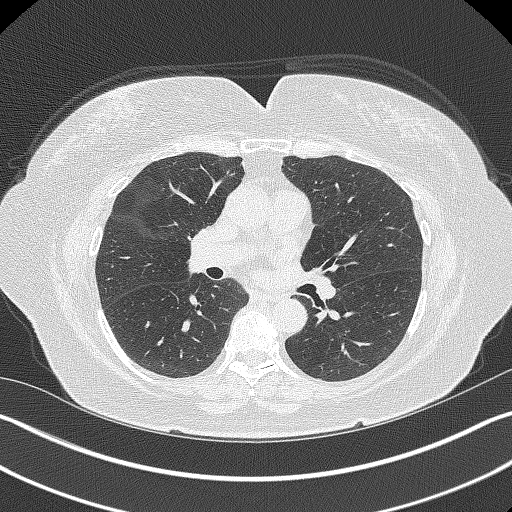
[im 201/325  lung]
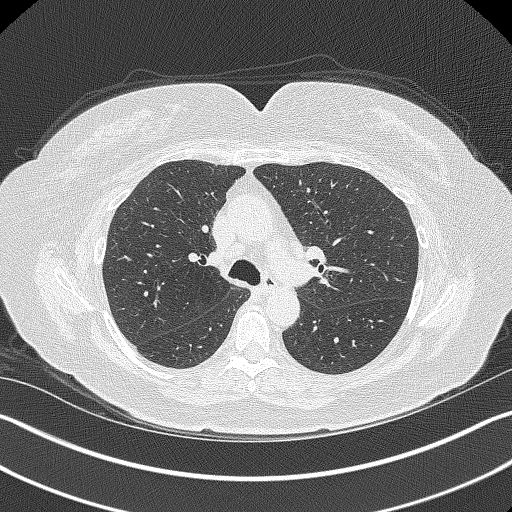
[im 232/325  mediastinal]
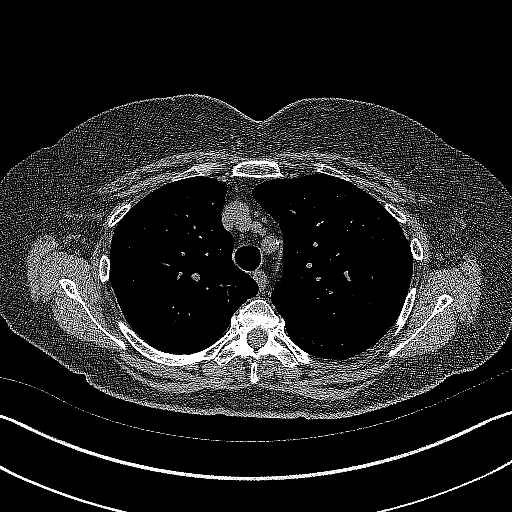
[im 232/325  lung]
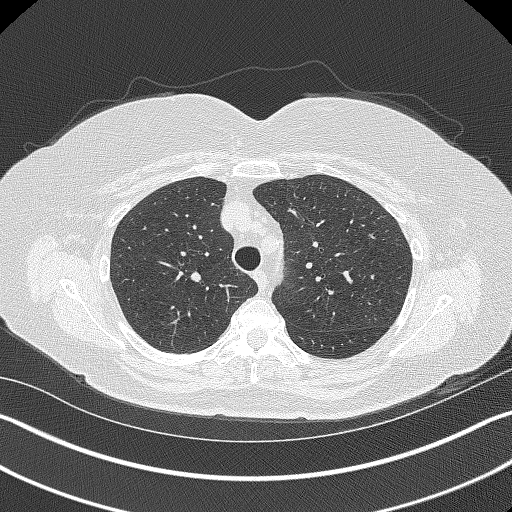
[im 247/325  lung]
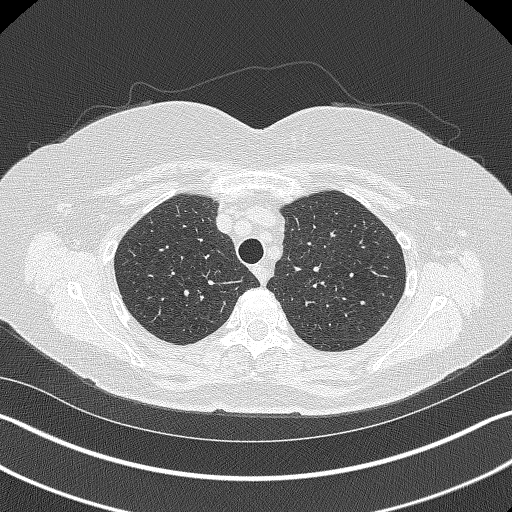
[im 278/325  lung]
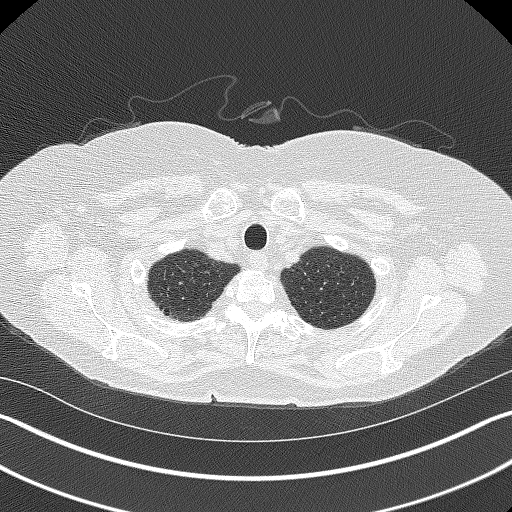
[im 309/325  lung]
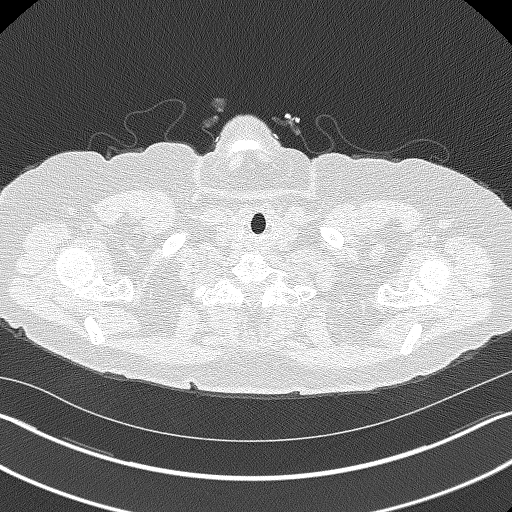

[15 of 40 positions shown; findings below may reference images not displayed]

FINDINGS: Cardiovascular: The heart size is normal. No substantial pericardial
effusion. No thoracic aortic aneurysm.

Mediastinum/Nodes: No mediastinal lymphadenopathy. No evidence for
gross hilar lymphadenopathy although assessment is limited by the
lack of intravenous contrast on today's study. The esophagus has
normal imaging features. There is no axillary lymphadenopathy.

Lungs/Pleura: Centrilobular emphsyema noted. 2.6 mm nodule
identified in the right middle lobe today. There is no new
suspicious pulmonary nodule or mass. No focal airspace
consolidation. No pleural effusion.

Upper Abdomen: Unremarkable.

Musculoskeletal: No worrisome lytic or sclerotic osseous
abnormality.
IMPRESSION: 1. Lung-RADS 2, benign appearance or behavior. Continue annual
screening with low-dose chest CT without contrast in 12 months.
2. Emphysema ([id number]).

## 2023-03-20 ENCOUNTER — Ambulatory Visit
Admission: RE | Admit: 2023-03-20 | Discharge: 2023-03-20 | Disposition: A | Discharge status: Home / Self Care | Payer: Medicare HMO | Source: Ambulatory Visit | Attending: Orthopedic Surgery | Admitting: Orthopedic Surgery

## 2023-03-20 DIAGNOSIS — M79651 Pain in right thigh: Secondary | ICD-10-CM | POA: Diagnosis not present

## 2023-03-20 DIAGNOSIS — M545 Low back pain, unspecified: Secondary | ICD-10-CM

## 2023-03-20 DIAGNOSIS — M25551 Pain in right hip: Secondary | ICD-10-CM | POA: Diagnosis not present

## 2023-03-27 DIAGNOSIS — M533 Sacrococcygeal disorders, not elsewhere classified: Secondary | ICD-10-CM | POA: Diagnosis not present

## 2023-03-28 ENCOUNTER — Other Ambulatory Visit: Payer: Self-pay | Admitting: Orthopedic Surgery

## 2023-03-28 ENCOUNTER — Encounter: Payer: Self-pay | Admitting: Orthopedic Surgery

## 2023-03-28 DIAGNOSIS — M545 Low back pain, unspecified: Secondary | ICD-10-CM

## 2023-04-07 ENCOUNTER — Other Ambulatory Visit: Payer: Self-pay | Admitting: Orthopedic Surgery

## 2023-04-07 ENCOUNTER — Other Ambulatory Visit: Payer: Self-pay | Admitting: Internal Medicine

## 2023-04-07 DIAGNOSIS — G8929 Other chronic pain: Secondary | ICD-10-CM

## 2023-04-07 DIAGNOSIS — M545 Low back pain, unspecified: Secondary | ICD-10-CM

## 2023-04-13 ENCOUNTER — Ambulatory Visit
Admission: RE | Admit: 2023-04-13 | Discharge: 2023-04-13 | Disposition: A | Discharge status: Home / Self Care | Payer: Medicare HMO | Source: Ambulatory Visit | Attending: Orthopedic Surgery | Admitting: Orthopedic Surgery

## 2023-04-13 DIAGNOSIS — M25551 Pain in right hip: Secondary | ICD-10-CM | POA: Diagnosis not present

## 2023-04-13 DIAGNOSIS — G8929 Other chronic pain: Secondary | ICD-10-CM

## 2023-04-13 DIAGNOSIS — M79651 Pain in right thigh: Secondary | ICD-10-CM | POA: Diagnosis not present

## 2023-05-04 ENCOUNTER — Other Ambulatory Visit: Payer: Self-pay | Admitting: Acute Care

## 2023-05-04 DIAGNOSIS — Z87891 Personal history of nicotine dependence: Secondary | ICD-10-CM

## 2023-05-04 DIAGNOSIS — F17210 Nicotine dependence, cigarettes, uncomplicated: Secondary | ICD-10-CM

## 2023-05-04 DIAGNOSIS — Z122 Encounter for screening for malignant neoplasm of respiratory organs: Secondary | ICD-10-CM

## 2023-05-05 ENCOUNTER — Telehealth: Payer: Self-pay | Admitting: Acute Care

## 2023-05-05 NOTE — Telephone Encounter (Signed)
PT can not keep CT sched in July. Please call to resched. Her # is [phone]

## 2023-05-05 NOTE — Telephone Encounter (Signed)
Spoke with pt and rescheduled LDCT for 06/14/23 9:40. PT verbalized understanding.

## 2023-05-31 DIAGNOSIS — L570 Actinic keratosis: Secondary | ICD-10-CM | POA: Diagnosis not present

## 2023-05-31 DIAGNOSIS — L821 Other seborrheic keratosis: Secondary | ICD-10-CM | POA: Diagnosis not present

## 2023-05-31 DIAGNOSIS — L814 Other melanin hyperpigmentation: Secondary | ICD-10-CM | POA: Diagnosis not present

## 2023-05-31 DIAGNOSIS — Z86018 Personal history of other benign neoplasm: Secondary | ICD-10-CM | POA: Diagnosis not present

## 2023-05-31 DIAGNOSIS — L578 Other skin changes due to chronic exposure to nonionizing radiation: Secondary | ICD-10-CM | POA: Diagnosis not present

## 2023-05-31 DIAGNOSIS — D225 Melanocytic nevi of trunk: Secondary | ICD-10-CM | POA: Diagnosis not present

## 2023-05-31 DIAGNOSIS — Z85828 Personal history of other malignant neoplasm of skin: Secondary | ICD-10-CM | POA: Diagnosis not present

## 2023-06-02 ENCOUNTER — Other Ambulatory Visit: Payer: Medicare HMO

## 2023-06-14 ENCOUNTER — Ambulatory Visit
Admission: RE | Admit: 2023-06-14 | Discharge: 2023-06-14 | Disposition: A | Discharge status: Home / Self Care | Payer: Medicare HMO | Source: Ambulatory Visit | Attending: Acute Care | Admitting: Acute Care

## 2023-06-14 DIAGNOSIS — F17210 Nicotine dependence, cigarettes, uncomplicated: Secondary | ICD-10-CM

## 2023-06-14 DIAGNOSIS — Z87891 Personal history of nicotine dependence: Secondary | ICD-10-CM

## 2023-06-14 DIAGNOSIS — Z122 Encounter for screening for malignant neoplasm of respiratory organs: Secondary | ICD-10-CM

## 2023-06-19 ENCOUNTER — Other Ambulatory Visit: Payer: Self-pay | Admitting: Internal Medicine

## 2023-06-22 ENCOUNTER — Other Ambulatory Visit: Payer: Self-pay

## 2023-06-22 DIAGNOSIS — Z87891 Personal history of nicotine dependence: Secondary | ICD-10-CM

## 2023-06-22 DIAGNOSIS — Z122 Encounter for screening for malignant neoplasm of respiratory organs: Secondary | ICD-10-CM

## 2023-06-22 DIAGNOSIS — F17210 Nicotine dependence, cigarettes, uncomplicated: Secondary | ICD-10-CM

## 2023-07-13 ENCOUNTER — Ambulatory Visit
Admission: EM | Admit: 2023-07-13 | Discharge: 2023-07-13 | Disposition: A | Discharge status: Home / Self Care | Payer: Medicare HMO | Attending: Emergency Medicine | Admitting: Emergency Medicine

## 2023-07-13 DIAGNOSIS — J209 Acute bronchitis, unspecified: Secondary | ICD-10-CM

## 2023-07-13 MED ORDER — AMOXICILLIN-POT CLAVULANATE 875-125 MG PO TABS: 1.0000 | ORAL_TABLET | Freq: Two times a day (BID) | ORAL | 0 refills | Status: AC

## 2023-07-13 MED ORDER — BENZONATATE 100 MG PO CAPS: 100.0000 mg | ORAL_CAPSULE | Freq: Three times a day (TID) | ORAL | 0 refills | Status: DC

## 2023-07-13 MED ORDER — PREDNISONE 20 MG PO TABS: 40.0000 mg | ORAL_TABLET | Freq: Every day | ORAL | 0 refills | Status: DC

## 2023-07-13 NOTE — ED Triage Notes (Signed)
Patient to Urgent Care with complaints of "barky" cough. Chest congestion and sinus pain.   Reports symptoms started approx 6 weeks ago.   Tried Corcidin, prescribed promethazine-DM. Little relief.

## 2023-07-13 NOTE — Discharge Instructions (Addendum)
On exam your lungs are clear and you are getting enough air without assistance, at this time do not believe he has pneumonia and therefore we have held off on imaging  Do believe he had bronchitis as you are experiencing shortness of breath and wheezing and your symptoms have been present for 6 weeks therefore we will start use of antibiotic and steroids for treatment  Begin Augmentin every morning and every evening for 10 days, will take 48 hours for medicine to fully get in your system but should see small improvement day by day  Begin prednisone every morning with food for 5 days to open and relax the airway, should help with shortness of breath wheezing and harshness of cough  You may use Tessalon pill every 8 hours as needed to help with coughing  You may continue use of Coricidin, cough syrup etc., may attempt any of the following below in addition    You can take Tylenol and/or Ibuprofen as needed for fever reduction and pain relief.   For cough: honey 1/2 to 1 teaspoon (you can dilute the honey in water or another fluid).  You can also use guaifenesin and dextromethorphan for cough. You can use a humidifier for chest congestion and cough.  If you don't have a humidifier, you can sit in the bathroom with the hot shower running.      For sore throat: try warm salt water gargles, cepacol lozenges, throat spray, warm tea or water with lemon/honey, popsicles or ice, or OTC cold relief medicine for throat discomfort.   For congestion: take a daily anti-histamine like Zyrtec, Claritin, and a oral decongestant, such as pseudoephedrine.  You can also use Flonase 1-2 sprays in each nostril daily.   It is important to stay hydrated: drink plenty of fluids (water, gatorade/powerade/pedialyte, juices, or teas) to keep your throat moisturized and help further relieve irritation/discomfort.

## 2023-07-13 NOTE — ED Provider Notes (Signed)
Renaldo Fiddler    CSN: [id number] Arrival date & time: 07/13/23  1047      History   Chief Complaint Chief Complaint  Patient presents with   Cough    HPI Brittany Burton is a 67 y.o. female.   Patient presents for evaluation of subjective fever, nasal congestion, sinus pain and pressure behind the eyes , a deep croupy nonproductive cough, shortness of breath at rest and wheezing present for 6 weeks.  No known sick contacts prior.  Tolerating food and liquids.  Has attempted use of Coricidin, Promethazine DM and additional over-the-counter medicines without relief.  History of COPD, daily tobacco use.  Past Medical History:  Diagnosis Date   BACK PAIN 03/18/2010   COPD (chronic obstructive pulmonary disease) (HCC) 05/18/2017   GERD 06/03/2009   HYPERLIPIDEMIA 06/03/2009   Kidney stone    LEG PAIN, RIGHT 03/18/2010   Osteopenia 06/2018   T score -1.8 FRAX 9.4% / 1.8%   PAF (paroxysmal atrial fibrillation) (HCC) 09/04/2011   POSTURAL LIGHTHEADEDNESS 06/17/2010   Vertigo    chronic recurrent, BPV, probl left related   VITAMIN D DEFICIENCY 06/03/2009    Patient Active Problem List   Diagnosis Date Noted   Encounter for well adult exam with abnormal findings 01/23/2023   Right lumbar radiculopathy 01/23/2023   Stroke-like symptom 08/02/2022   Tobacco abuse 08/02/2022   Hypothermia 08/02/2022   History of malignant neoplasm of skin 07/14/2021   Lentigo 07/14/2021   Actinic keratosis 07/14/2021   Thrombocytopenia (HCC) 01/07/2019   COPD (chronic obstructive pulmonary disease) (HCC) 05/18/2017   Hyperglycemia 05/17/2017   Centrilobular emphysema (HCC) 12/15/2016   Smoker 01/31/2013   Abnormal liver function tests 01/31/2013   Meralgia paraesthetica, right 02/29/2012   PAF (paroxysmal atrial fibrillation) (HCC) 09/04/2011   Vertigo 09/04/2011   Vitamin D deficiency 06/03/2009   Hyperlipidemia 06/03/2009    History reviewed. No pertinent surgical  history.  OB History     Gravida  0   Para  0   Term  0   Preterm  0   AB  0   Living  0      SAB  0   IAB  0   Ectopic  0   Multiple  0   Live Births  0            Home Medications    Prior to Admission medications   Medication Sig Start Date End Date Taking? Authorizing Provider  amoxicillin-clavulanate (AUGMENTIN) 875-125 MG tablet Take 1 tablet by mouth 2 (two) times daily for 10 days. 07/13/23 07/23/23 Yes Niesha Bame R, NP  benzonatate (TESSALON) 100 MG capsule Take 1 capsule (100 mg total) by mouth every 8 (eight) hours. 07/13/23  Yes Mounir Skipper R, NP  predniSONE (DELTASONE) 20 MG tablet Take 2 tablets (40 mg total) by mouth daily. 07/13/23  Yes Valinda Hoar, NP  aspirin 81 MG EC tablet Take 1 tablet (81 mg total) by mouth daily. Swallow whole. 01/31/13   Corwin Levins, MD  gabapentin (NEURONTIN) 100 MG capsule Take 1 capsule (100 mg total) by mouth 3 (three) times daily. 01/23/23   Corwin Levins, MD  meclizine (ANTIVERT) 25 MG tablet Take 1 tablet (25 mg total) by mouth daily as needed. 01/14/20   Corwin Levins, MD  promethazine-dextromethorphan (PROMETHAZINE-DM) 6.25-15 MG/5ML syrup TAKE 5 MLS BY MOUTH 4 TIMES A DAY AS NEEDED FOR COUGH 06/20/23   Corwin Levins, MD  rosuvastatin (CRESTOR)  20 MG tablet TAKE 1 TABLET BY MOUTH EVERY DAY 04/07/23   Corwin Levins, MD    Family History Family History  Problem Relation Age of Onset   Melanoma Father    Esophageal cancer Father    Ovarian cancer Sister 74       stage 3   Melanoma Sister    Cancer Paternal Grandmother        colon late 70's   Colon cancer Paternal Grandmother    Pancreatic cancer Neg Hx    Rectal cancer Neg Hx    Stomach cancer Neg Hx     Social History Social History   Tobacco Use   Smoking status: Every Day    Current packs/day: 1.75    Average packs/day: 1.8 packs/day for 43.0 years (75.3 ttl pk-yrs)    Types: Cigarettes   Smokeless tobacco: Never  Vaping Use   Vaping  status: Never Used  Substance Use Topics   Alcohol use: No    Alcohol/week: 0.0 standard drinks of alcohol   Drug use: No     Allergies   Patient has no known allergies.   Review of Systems Review of Systems  Respiratory:  Positive for cough.      Physical Exam Triage Vital Signs ED Triage Vitals  Encounter Vitals Group     BP 07/13/23 1052 (!) 151/84     Systolic BP Percentile --      Diastolic BP Percentile --      Pulse Rate 07/13/23 1052 77     Resp 07/13/23 1052 18     Temp 07/13/23 1052 98.1 F (36.7 C)     Temp Source 07/13/23 1052 Oral     SpO2 07/13/23 1052 94 %     Weight --      Height --      Head Circumference --      Peak Flow --      Pain Score 07/13/23 1049 0     Pain Loc --      Pain Education --      Exclude from Growth Chart --    No data found.  Updated Vital Signs BP (!) 151/84 (BP Location: Left Arm)   Pulse 77   Temp 98.1 F (36.7 C) (Oral)   Resp 18   SpO2 94%   Visual Acuity Right Eye Distance:   Left Eye Distance:   Bilateral Distance:    Right Eye Near:   Left Eye Near:    Bilateral Near:     Physical Exam Constitutional:      Appearance: Normal appearance.  HENT:     Head: Normocephalic.     Right Ear: Tympanic membrane, ear canal and external ear normal.     Left Ear: Tympanic membrane, ear canal and external ear normal.     Nose: Congestion present. No rhinorrhea.     Right Sinus: No maxillary sinus tenderness or frontal sinus tenderness.     Left Sinus: No maxillary sinus tenderness or frontal sinus tenderness.     Mouth/Throat:     Mouth: Mucous membranes are moist.     Pharynx: Oropharynx is clear.  Eyes:     Extraocular Movements: Extraocular movements intact.  Cardiovascular:     Rate and Rhythm: Normal rate and regular rhythm.     Pulses: Normal pulses.     Heart sounds: Normal heart sounds.  Pulmonary:     Effort: Pulmonary effort is normal.     Breath sounds: Normal  breath sounds.  Musculoskeletal:      Cervical back: Normal range of motion and neck supple.  Neurological:     Mental Status: She is alert and oriented to person, place, and time. Mental status is at baseline.      UC Treatments / Results  Labs (all labs ordered are listed, but only abnormal results are displayed) Labs Reviewed - No data to display  EKG   Radiology No results found.  Procedures Procedures (including critical care time)  Medications Ordered in UC Medications - No data to display  Initial Impression / Assessment and Plan / UC Course  I have reviewed the triage vital signs and the nursing notes.  Pertinent labs & imaging results that were available during my care of the patient were reviewed by me and considered in my medical decision making (see chart for details).  Acute bronchitis  Vital signs are stable, patient is in no signs of distress nontoxic-appearing, lungs are clear to auscultation and O2 saturation 94% on room air, low suspicion for pneumonia at this time therefore imaging deferred, discussed with patient, experiencing shortness of breath and wheezing and symptoms have persisted for 6 weeks without any signs of resolution therefore treating with antibiotics and steroids as presentation is most consistent with bronchitis, history of COPD, Augmentin and prednisone sent to pharmacy as well as Tessalon for management of coughing, may continue use of over-the-counter medications for supportive care, given strict precautions for follow-up if symptoms continue to persist past use of medication or worsen at any point Final Clinical Impressions(s) / UC Diagnoses   Final diagnoses:  Acute bronchitis, unspecified organism     Discharge Instructions      On exam your lungs are clear and you are getting enough air without assistance, at this time do not believe he has pneumonia and therefore we have held off on imaging  Do believe he had bronchitis as you are experiencing shortness of  breath and wheezing and your symptoms have been present for 6 weeks therefore we will start use of antibiotic and steroids for treatment  Begin Augmentin every morning and every evening for 10 days, will take 48 hours for medicine to fully get in your system but should see small improvement day by day  Begin prednisone every morning with food for 5 days to open and relax the airway, should help with shortness of breath wheezing and harshness of cough  You may use Tessalon pill every 8 hours as needed to help with coughing  You may continue use of Coricidin, cough syrup etc., may attempt any of the following below in addition    You can take Tylenol and/or Ibuprofen as needed for fever reduction and pain relief.   For cough: honey 1/2 to 1 teaspoon (you can dilute the honey in water or another fluid).  You can also use guaifenesin and dextromethorphan for cough. You can use a humidifier for chest congestion and cough.  If you don't have a humidifier, you can sit in the bathroom with the hot shower running.      For sore throat: try warm salt water gargles, cepacol lozenges, throat spray, warm tea or water with lemon/honey, popsicles or ice, or OTC cold relief medicine for throat discomfort.   For congestion: take a daily anti-histamine like Zyrtec, Claritin, and a oral decongestant, such as pseudoephedrine.  You can also use Flonase 1-2 sprays in each nostril daily.   It is important to stay hydrated: drink plenty of fluids (  water, gatorade/powerade/pedialyte, juices, or teas) to keep your throat moisturized and help further relieve irritation/discomfort.    ED Prescriptions     Medication Sig Dispense Auth. Provider   amoxicillin-clavulanate (AUGMENTIN) 875-125 MG tablet Take 1 tablet by mouth 2 (two) times daily for 10 days. 20 tablet Livier Hendel R, NP   predniSONE (DELTASONE) 20 MG tablet Take 2 tablets (40 mg total) by mouth daily. 10 tablet Dontae Minerva R, NP   benzonatate  (TESSALON) 100 MG capsule Take 1 capsule (100 mg total) by mouth every 8 (eight) hours. 21 capsule Saket Hellstrom, Elita Boone, NP      PDMP not reviewed this encounter.   Valinda Hoar, NP 07/13/23 1110

## 2023-07-19 DIAGNOSIS — H5213 Myopia, bilateral: Secondary | ICD-10-CM | POA: Diagnosis not present

## 2023-08-03 DIAGNOSIS — Z0100 Encounter for examination of eyes and vision without abnormal findings: Secondary | ICD-10-CM | POA: Diagnosis not present

## 2023-09-20 DIAGNOSIS — Z1231 Encounter for screening mammogram for malignant neoplasm of breast: Secondary | ICD-10-CM | POA: Diagnosis not present

## 2023-09-20 LAB — HM MAMMOGRAPHY

## 2024-01-17 ENCOUNTER — Other Ambulatory Visit (INDEPENDENT_AMBULATORY_CARE_PROVIDER_SITE_OTHER): Payer: Medicare HMO

## 2024-01-17 DIAGNOSIS — R739 Hyperglycemia, unspecified: Secondary | ICD-10-CM

## 2024-01-17 DIAGNOSIS — E559 Vitamin D deficiency, unspecified: Secondary | ICD-10-CM

## 2024-01-17 DIAGNOSIS — E538 Deficiency of other specified B group vitamins: Secondary | ICD-10-CM | POA: Diagnosis not present

## 2024-01-17 DIAGNOSIS — E785 Hyperlipidemia, unspecified: Secondary | ICD-10-CM

## 2024-01-17 LAB — HEMOGLOBIN A1C: Hgb A1c MFr Bld: 5.9 % (ref 4.6–6.5)

## 2024-01-17 LAB — URINALYSIS, ROUTINE W REFLEX MICROSCOPIC
Bilirubin Urine: NEGATIVE
Hgb urine dipstick: NEGATIVE
Ketones, ur: NEGATIVE
Leukocytes,Ua: NEGATIVE
Nitrite: NEGATIVE
RBC / HPF: NONE SEEN (ref 0–?)
Specific Gravity, Urine: >=1.03 — AB (ref 1.000–1.030)
Total Protein, Urine: NEGATIVE
Urine Glucose: NEGATIVE
Urobilinogen, UA: 0.2 (ref 0.0–1.0)
pH: 6 (ref 5.0–8.0)

## 2024-01-17 LAB — BASIC METABOLIC PANEL
BUN: 18 mg/dL (ref 6–23)
CO2: 26 meq/L (ref 19–32)
Calcium: 9.6 mg/dL (ref 8.4–10.5)
Chloride: 103 meq/L (ref 96–112)
Creatinine, Ser: 0.69 mg/dL (ref 0.40–1.20)
GFR: 89.71 mL/min (ref >60.00)
Glucose, Bld: 101 mg/dL — ABNORMAL HIGH (ref 70–99)
Potassium: 4.7 meq/L (ref 3.5–5.1)
Sodium: 138 meq/L (ref 135–145)

## 2024-01-17 LAB — CBC WITH DIFFERENTIAL/PLATELET
Basophils Absolute: 0 10*3/uL (ref 0.0–0.1)
Basophils Relative: 0.7 % (ref 0.0–3.0)
Eosinophils Absolute: 0.1 10*3/uL (ref 0.0–0.7)
Eosinophils Relative: 1.5 % (ref 0.0–5.0)
HCT: 46.3 % — ABNORMAL HIGH (ref 36.0–46.0)
Hemoglobin: 15.6 g/dL — ABNORMAL HIGH (ref 12.0–15.0)
Lymphocytes Relative: 35.6 % (ref 12.0–46.0)
Lymphs Abs: 1.9 10*3/uL (ref 0.7–4.0)
MCHC: 33.6 g/dL (ref 30.0–36.0)
MCV: 96.5 fl (ref 78.0–100.0)
Monocytes Absolute: 0.5 10*3/uL (ref 0.1–1.0)
Monocytes Relative: 10 % (ref 3.0–12.0)
Neutro Abs: 2.9 10*3/uL (ref 1.4–7.7)
Neutrophils Relative %: 52.2 % (ref 43.0–77.0)
Platelets: 215 10*3/uL (ref 150.0–400.0)
RBC: 4.8 Mil/uL (ref 3.87–5.11)
RDW: 12.7 % (ref 11.5–15.5)
WBC: 5.5 10*3/uL (ref 4.0–10.5)

## 2024-01-17 LAB — VITAMIN D 25 HYDROXY (VIT D DEFICIENCY, FRACTURES): VITD: 33.4 ng/mL (ref 30.00–100.00)

## 2024-01-17 LAB — HEPATIC FUNCTION PANEL
ALT: 27 U/L (ref 0–35)
AST: 25 U/L (ref 0–37)
Albumin: 4.3 g/dL (ref 3.5–5.2)
Alkaline Phosphatase: 90 U/L (ref 39–117)
Bilirubin, Direct: 0.2 mg/dL (ref 0.0–0.3)
Total Bilirubin: 1.1 mg/dL (ref 0.2–1.2)
Total Protein: 7.6 g/dL (ref 6.0–8.3)

## 2024-01-17 LAB — LIPID PANEL
Cholesterol: 161 mg/dL (ref 0–200)
HDL: 48.6 mg/dL (ref >39.00)
LDL Cholesterol: 89 mg/dL (ref 0–99)
NonHDL: 112.03
Total CHOL/HDL Ratio: 3
Triglycerides: 113 mg/dL (ref 0.0–149.0)
VLDL: 22.6 mg/dL (ref 0.0–40.0)

## 2024-01-17 LAB — VITAMIN B12: Vitamin B-12: 297 pg/mL (ref 211–911)

## 2024-01-17 LAB — TSH: TSH: 3.35 u[IU]/mL (ref 0.35–5.50)

## 2024-01-19 NOTE — Progress Notes (Signed)
 This encounter was created in error - please disregard. Called pt X3 no answer/ LDM to call office back to reschedule visit.

## 2024-01-24 ENCOUNTER — Ambulatory Visit (INDEPENDENT_AMBULATORY_CARE_PROVIDER_SITE_OTHER): Payer: Medicare HMO | Admitting: Internal Medicine

## 2024-01-24 ENCOUNTER — Encounter: Payer: Self-pay | Admitting: Internal Medicine

## 2024-01-24 VITALS — BP 120/72 | HR 70 | Temp 98.8°F | Ht 65.0 in | Wt 192.0 lb

## 2024-01-24 DIAGNOSIS — E559 Vitamin D deficiency, unspecified: Secondary | ICD-10-CM | POA: Diagnosis not present

## 2024-01-24 DIAGNOSIS — Z0000 Encounter for general adult medical examination without abnormal findings: Secondary | ICD-10-CM

## 2024-01-24 DIAGNOSIS — E538 Deficiency of other specified B group vitamins: Secondary | ICD-10-CM | POA: Diagnosis not present

## 2024-01-24 DIAGNOSIS — F17200 Nicotine dependence, unspecified, uncomplicated: Secondary | ICD-10-CM

## 2024-01-24 DIAGNOSIS — J449 Chronic obstructive pulmonary disease, unspecified: Secondary | ICD-10-CM | POA: Diagnosis not present

## 2024-01-24 DIAGNOSIS — E7800 Pure hypercholesterolemia, unspecified: Secondary | ICD-10-CM | POA: Diagnosis not present

## 2024-01-24 DIAGNOSIS — M533 Sacrococcygeal disorders, not elsewhere classified: Secondary | ICD-10-CM | POA: Diagnosis not present

## 2024-01-24 DIAGNOSIS — R739 Hyperglycemia, unspecified: Secondary | ICD-10-CM

## 2024-01-24 DIAGNOSIS — Z0001 Encounter for general adult medical examination with abnormal findings: Secondary | ICD-10-CM

## 2024-01-24 MED ORDER — ROSUVASTATIN CALCIUM 20 MG PO TABS: 20.0000 mg | ORAL_TABLET | Freq: Every day | ORAL | 3 refills | Status: DC

## 2024-01-24 NOTE — Progress Notes (Signed)
 Patient ID: Brittany Burton, female   DOB: 03-Apr-1956, 68 y.o.   MRN: [id number]         Chief Complaint:: wellness exam and copd, hyperglycemia, low vit d, smoker       HPI:  Brittany Burton is a 68 y.o. female here for wellness exam; decliens covid booster; still smoking, not ready to quit                        Also Pt denies chest pain, increased sob or doe, wheezing, orthopnea, PND, increased LE swelling, palpitations, dizziness or syncope.    Pt denies polydipsia, polyuria, or new focal neuro s/s.    Pt denies fever, wt loss, night sweats, loss of appetite, or other constitutional symptoms     Wt Readings from Last 3 Encounters:  01/24/24 192 lb (87.1 kg)  01/23/23 180 lb (81.6 kg)  12/14/22 182 lb 9.6 oz (82.8 kg)   BP Readings from Last 3 Encounters:  01/24/24 120/72  07/13/23 (!) 151/84  01/23/23 130/80   Immunization History  Administered Date(s) Administered   Fluad Quad(high Dose 65+) 09/07/2022   Influenza, High Dose Seasonal PF 08/25/2023   Influenza,inj,Quad PF,6+ Mos 08/28/2018, 08/02/2019, 09/03/2020   Influenza-Unspecified 09/16/2015, 08/26/2021   PFIZER(Purple Top)SARS-COV-2 Vaccination 01/27/2020, 02/26/2020, 09/10/2020, 08/26/2021   PNEUMOCOCCAL CONJUGATE-20 01/17/2022   Pfizer(Comirnaty)Fall Seasonal Vaccine 12 years and older 09/07/2022, 08/25/2023   Respiratory Syncytial Virus Vaccine,Recomb Aduvanted(Arexvy) 09/07/2022   Td 06/03/2009   Tdap 01/07/2019   Zoster Recombinant(Shingrix) 08/29/2019, 11/25/2019   Zoster, Live 02/04/2013   Health Maintenance Due  Topic Date Due   Medicare Annual Wellness (AWV)  12/15/2023      Past Medical History:  Diagnosis Date   BACK PAIN 03/18/2010   COPD (chronic obstructive pulmonary disease) (HCC) 05/18/2017   GERD 06/03/2009   HYPERLIPIDEMIA 06/03/2009   Kidney stone    LEG PAIN, RIGHT 03/18/2010   Osteopenia 06/2018   T score -1.8 FRAX 9.4% / 1.8%   PAF (paroxysmal atrial fibrillation) (HCC) 09/04/2011    POSTURAL LIGHTHEADEDNESS 06/17/2010   Vertigo    chronic recurrent, BPV, probl left related   VITAMIN D DEFICIENCY 06/03/2009   History reviewed. No pertinent surgical history.  reports that she has been smoking cigarettes. She has a 75.3 pack-year smoking history. She has never used smokeless tobacco. She reports that she does not drink alcohol and does not use drugs. family history includes Cancer in her paternal grandmother; Colon cancer in her paternal grandmother; Esophageal cancer in her father; Melanoma in her father and sister; Ovarian cancer (age of onset: 21) in her sister. No Known Allergies Current Outpatient Medications on File Prior to Visit  Medication Sig Dispense Refill   aspirin 81 MG EC tablet Take 1 tablet (81 mg total) by mouth daily. Swallow whole. 30 tablet 12   gabapentin (NEURONTIN) 100 MG capsule Take 1 capsule (100 mg total) by mouth 3 (three) times daily. 90 capsule 5   meclizine (ANTIVERT) 25 MG tablet Take 1 tablet (25 mg total) by mouth daily as needed. 90 tablet 3   No current facility-administered medications on file prior to visit.        ROS:  All others reviewed and negative.  Objective        PE:  BP 120/72 (BP Location: Right Arm, Patient Position: Sitting, Cuff Size: Normal)   Pulse 70   Temp 98.8 F (37.1 C) (Oral)   Ht 5\' 5"  (1.651 m)  Wt 192 lb (87.1 kg)   SpO2 95%   BMI 31.95 kg/m                 Constitutional: Pt appears in NAD               HENT: Head: NCAT.                Right Ear: External ear normal.                 Left Ear: External ear normal.                Eyes: . Pupils are equal, round, and reactive to light. Conjunctivae and EOM are normal               Nose: without d/c or deformity               Neck: Neck supple. Gross normal ROM               Cardiovascular: Normal rate and regular rhythm.                 Pulmonary/Chest: Effort normal and breath sounds without rales or wheezing.                Abd:  Soft, NT,  ND, + BS, no organomegaly               Neurological: Pt is alert. At baseline orientation, motor grossly intact               Skin: Skin is warm. No rashes, no other new lesions, LE edema - none               Psychiatric: Pt behavior is normal without agitation   Micro: none  Cardiac tracings I have personally interpreted today:  none  Pertinent Radiological findings (summarize): none   Lab Results  Component Value Date   WBC 5.5 01/17/2024   HGB 15.6 (H) 01/17/2024   HCT 46.3 (H) 01/17/2024   PLT 215.0 01/17/2024   GLUCOSE 101 (H) 01/17/2024   CHOL 161 01/17/2024   TRIG 113.0 01/17/2024   HDL 48.60 01/17/2024   LDLDIRECT 145.2 01/28/2013   LDLCALC 89 01/17/2024   ALT 27 01/17/2024   AST 25 01/17/2024   NA 138 01/17/2024   K 4.7 01/17/2024   CL 103 01/17/2024   CREATININE 0.69 01/17/2024   BUN 18 01/17/2024   CO2 26 01/17/2024   TSH 3.35 01/17/2024   INR 1.0 08/02/2022   HGBA1C 5.9 01/17/2024   Assessment/Plan:  Brittany Burton is a 67 y.o. White or Caucasian [1] female with  has a past medical history of BACK PAIN (03/18/2010), COPD (chronic obstructive pulmonary disease) (HCC) (05/18/2017), GERD (06/03/2009), HYPERLIPIDEMIA (06/03/2009), Kidney stone, LEG PAIN, RIGHT (03/18/2010), Osteopenia (06/2018), PAF (paroxysmal atrial fibrillation) (HCC) (09/04/2011), POSTURAL LIGHTHEADEDNESS (06/17/2010), Vertigo, and VITAMIN D DEFICIENCY (06/03/2009).  COPD (chronic obstructive pulmonary disease) (HCC) Very mild, does not require inhaler prn  Sacroiliac pain Mild recurrent, to consider otc volt gel prn  Encounter for well adult exam with abnormal findings Age and sex appropriate education and counseling updated with regular exercise and diet Referrals for preventative services - none needed Immunizations addressed - decliens covid booster Smoking counseling  - pt still smoking, not ready to quit after counseling Evidence for depression or other mood disorder - none  significant Most recent labs reviewed. I have personally reviewed and have noted: 1) the  patient's medical and social history 2) The patient's current medications and supplements 3) The patient's height, weight, and BMI have been recorded in the chart   Hyperglycemia Lab Results  Component Value Date   HGBA1C 5.9 01/17/2024   Stable, pt to continue current medical treatment  - diet, wt control   Smoker Pt counsled to quit, pt not ready  Vitamin D deficiency Last vitamin D Lab Results  Component Value Date   VD25OH 33.40 01/17/2024   Low, to start oral replacement   B12 deficiency Lab Results  Component Value Date   VITAMINB12 297 01/17/2024   Low, to start oral replacement - b12 1000 mcg qd   HLD (hyperlipidemia) Lab Results  Component Value Date   LDLCALC 89 01/17/2024   Stable, pt to continue current statin crestor 20 mg qd   Followup: Return in about 1 year (around 01/23/2025).  Oliver Barre, MD 01/28/2024 11:45 AM [facility]

## 2024-01-24 NOTE — Assessment & Plan Note (Signed)
 Mild recurrent, to consider otc volt gel prn

## 2024-01-24 NOTE — Patient Instructions (Signed)
Please continue all other medications as before, and refills have been done if requested.  Please have the pharmacy call with any other refills you may need.  Please continue your efforts at being more active, low cholesterol diet, and weight control.  You are otherwise up to date with prevention measures today.  Please keep your appointments with your specialists as you may have planned  Please make an Appointment to return for your 1 year visit, or sooner if needed, with Lab testing by Appointment as well, to be done about 3-5 days before at the FIRST FLOOR Lab (so this is for TWO appointments - please see the scheduling desk as you leave)    

## 2024-01-24 NOTE — Assessment & Plan Note (Signed)
 Very mild, does not require inhaler prn

## 2024-01-28 ENCOUNTER — Encounter: Payer: Self-pay | Admitting: Internal Medicine

## 2024-01-28 DIAGNOSIS — E538 Deficiency of other specified B group vitamins: Secondary | ICD-10-CM | POA: Insufficient documentation

## 2024-01-28 DIAGNOSIS — E785 Hyperlipidemia, unspecified: Secondary | ICD-10-CM | POA: Insufficient documentation

## 2024-01-28 NOTE — Assessment & Plan Note (Signed)
 Lab Results  Component Value Date   LDLCALC 89 01/17/2024   Stable, pt to continue current statin crestor 20 mg qd

## 2024-01-28 NOTE — Assessment & Plan Note (Signed)
 Last vitamin D Lab Results  Component Value Date   VD25OH 33.40 01/17/2024   Low, to start oral replacement

## 2024-01-28 NOTE — Assessment & Plan Note (Signed)
 Pt counsled to quit, pt not ready

## 2024-01-28 NOTE — Assessment & Plan Note (Signed)
 Lab Results  Component Value Date   HGBA1C 5.9 01/17/2024   Stable, pt to continue current medical treatment  - diet, wt control

## 2024-01-28 NOTE — Assessment & Plan Note (Signed)
 Age and sex appropriate education and counseling updated with regular exercise and diet Referrals for preventative services - none needed Immunizations addressed - decliens covid booster Smoking counseling  - pt still smoking, not ready to quit after counseling Evidence for depression or other mood disorder - none significant Most recent labs reviewed. I have personally reviewed and have noted: 1) the patient's medical and social history 2) The patient's current medications and supplements 3) The patient's height, weight, and BMI have been recorded in the chart

## 2024-01-28 NOTE — Assessment & Plan Note (Signed)
 Lab Results  Component Value Date   VITAMINB12 297 01/17/2024   Low, to start oral replacement - b12 1000 mcg qd

## 2024-02-26 ENCOUNTER — Ambulatory Visit: Payer: Medicare HMO

## 2024-02-26 VITALS — Ht 65.0 in | Wt 192.0 lb

## 2024-02-26 DIAGNOSIS — F17200 Nicotine dependence, unspecified, uncomplicated: Secondary | ICD-10-CM

## 2024-02-26 DIAGNOSIS — Z122 Encounter for screening for malignant neoplasm of respiratory organs: Secondary | ICD-10-CM

## 2024-02-26 DIAGNOSIS — Z0000 Encounter for general adult medical examination without abnormal findings: Secondary | ICD-10-CM

## 2024-02-26 NOTE — Patient Instructions (Addendum)
 Ms. Brittany Burton , Thank you for taking time to come for your Medicare Wellness Visit. I appreciate your ongoing commitment to your health goals. Please review the following plan we discussed and let me know if I can assist you in the future.   Referrals/Orders/Follow-Ups/Clinician Recommendations: Aim for 30 minutes of exercise or brisk walking, 6-8 glasses of water, and 5 servings of fruits and vegetables each day. Lung Cancer Screening ordered.    This is a list of the screening recommended for you and due dates:  Health Maintenance  Topic Date Due   COVID-19 Vaccine (7 - Pfizer risk 2024-25 season) 02/23/2024   Screening for Lung Cancer  06/13/2024   Flu Shot  06/21/2024   Medicare Annual Wellness Visit  02/25/2025   Mammogram  09/19/2025   Colon Cancer Screening  12/13/2027   DTaP/Tdap/Td vaccine (3 - Td or Tdap) 01/07/2029   Pneumonia Vaccine  Completed   DEXA scan (bone density measurement)  Completed   Hepatitis C Screening  Completed   Zoster (Shingles) Vaccine  Completed   HPV Vaccine  Aged Out    Advanced directives: (Copy Requested) Please bring a copy of your health care power of attorney and living will to the office to be added to your chart at your convenience. You can mail to [facility] [address] [place], [place] [address] or email to [email or web address]  Next Medicare Annual Wellness Visit scheduled for next year: Yes

## 2024-02-26 NOTE — Progress Notes (Cosign Needed Addendum)
 Subjective:   Brittany Burton is a 68 y.o. who presents for a Medicare Wellness preventive visit.  Visit Complete: Virtual I connected with  Brittany Burton on 02/26/2024 by a audio enabled telemedicine application and verified that I am speaking with the correct person using two identifiers.  Patient Location: Home  Provider Location: Office/Clinic  I discussed the limitations of evaluation and management by telemedicine. The patient expressed understanding and agreed to proceed.  Vital Signs: Because this visit was a virtual/telehealth visit, some criteria may be missing or patient reported. Any vitals not documented were not able to be obtained and vitals that have been documented are patient reported.  VideoDeclined- This patient declined Librarian, academic. Therefore the visit was completed with audio only.  Persons Participating in Visit: Patient.  AWV Questionnaire: No: Patient Medicare AWV questionnaire was not completed prior to this visit.  Cardiac Risk Factors include: advanced age (>83men, >75 women);dyslipidemia;obesity (BMI >30kg/m2)     Objective:    Today's Vitals   02/26/24 0806  Weight: 192 lb (87.1 kg)  Height: 5\' 5"  (1.651 m)   Body mass index is 31.95 kg/m.     02/26/2024    8:06 AM 07/13/2023   10:49 AM 12/14/2022    8:54 AM 08/02/2022    6:00 PM 08/02/2022    1:27 PM 06/21/2017    8:51 AM  Advanced Directives  Does Patient Have a Medical Advance Directive? Yes No Yes No Unable to assess, patient is non-responsive or altered mental status Yes  Type of Public librarian Power of [place];Living will  Healthcare Power of [place];Living will   Healthcare Power of [place];Living will  Copy of Healthcare Power of Attorney in Chart? No - copy requested  No - copy requested     Would patient like information on creating a medical advance directive?    No - Patient declined      Current Medications  (verified) Outpatient Encounter Medications as of 02/26/2024  Medication Sig   aspirin 81 MG EC tablet Take 1 tablet (81 mg total) by mouth daily. Swallow whole.   gabapentin (NEURONTIN) 100 MG capsule Take 1 capsule (100 mg total) by mouth 3 (three) times daily.   meclizine (ANTIVERT) 25 MG tablet Take 1 tablet (25 mg total) by mouth daily as needed.   rosuvastatin (CRESTOR) 20 MG tablet Take 1 tablet (20 mg total) by mouth daily.   No facility-administered encounter medications on file as of 02/26/2024.    Allergies (verified) Patient has no known allergies.   History: Past Medical History:  Diagnosis Date   BACK PAIN 03/18/2010   COPD (chronic obstructive pulmonary disease) (HCC) 05/18/2017   GERD 06/03/2009   HYPERLIPIDEMIA 06/03/2009   Kidney stone    LEG PAIN, RIGHT 03/18/2010   Osteopenia 06/2018   T score -1.8 FRAX 9.4% / 1.8%   PAF (paroxysmal atrial fibrillation) (HCC) 09/04/2011   POSTURAL LIGHTHEADEDNESS 06/17/2010   Vertigo    chronic recurrent, BPV, probl left related   VITAMIN D DEFICIENCY 06/03/2009   History reviewed. No pertinent surgical history. Family History  Problem Relation Age of Onset   Melanoma Father    Esophageal cancer Father    Ovarian cancer Sister 30       stage 3   Melanoma Sister    Cancer Paternal Grandmother        colon late 70's   Colon cancer Paternal Grandmother    Pancreatic cancer Neg Hx  Rectal cancer Neg Hx    Stomach cancer Neg Hx    Social History   Socioeconomic History   Marital status: Married    Spouse name: Not on file   Number of children: 0   Years of education: Not on file   Highest education level: Bachelor's degree (e.g., BA, AB, BS)  Occupational History   Occupation: [employer]    Employer: [employer]  Tobacco Use   Smoking status: Every Day    Current packs/day: 1.75    Average packs/day: 1.8 packs/day for 49.3 years (86.2 ttl pk-yrs)    Types: Cigarettes    Start date: 11/21/1974    Passive  exposure: Current   Smokeless tobacco: Never  Vaping Use   Vaping status: Never Used  Substance and Sexual Activity   Alcohol use: No    Alcohol/week: 0.0 standard drinks of alcohol   Drug use: No   Sexual activity: Yes    Birth control/protection: Post-menopausal    Comment: 1st intercourse 68 yo-Fewer than 5 partners,des neg  Other Topics Concern   Not on file  Social History Narrative   Married   Current smoker   Social Drivers of Corporate investment banker Strain: Low Risk  (02/26/2024)   Overall Financial Resource Strain (CARDIA)    Difficulty of Paying Living Expenses: Not hard at all  Food Insecurity: No Food Insecurity (02/26/2024)   Hunger Vital Sign    Worried About Running Out of Food in the Last Year: Never true    Ran Out of Food in the Last Year: Never true  Transportation Needs: No Transportation Needs (02/26/2024)   PRAPARE - Administrator, Civil Service (Medical): No    Lack of Transportation (Non-Medical): No  Physical Activity: Sufficiently Active (02/26/2024)   Exercise Vital Sign    Days of Exercise per Week: 3 days    Minutes of Exercise per Session: 60 min  Recent Concern: Physical Activity - Insufficiently Active (01/17/2024)   Exercise Vital Sign    Days of Exercise per Week: 2 days    Minutes of Exercise per Session: 60 min  Stress: No Stress Concern Present (02/26/2024)   [employer] of Occupational Health - Occupational Stress Questionnaire    Feeling of Stress : Not at all  Recent Concern: Stress - Stress Concern Present (01/17/2024)   [employer] of Occupational Health - Occupational Stress Questionnaire    Feeling of Stress : To some extent  Social Connections: Moderately Integrated (02/26/2024)   Social Connection and Isolation Panel [NHANES]    Frequency of Communication with Friends and Family: More than three times a week    Frequency of Social Gatherings with Friends and Family: More than three times a week    Attends  Religious Services: Never    Database administrator or Organizations: Yes    Attends Engineer, structural: More than 4 times per year    Marital Status: Married    Tobacco Counseling Ready to quit: No Counseling given: Yes    Clinical Intake:  Pre-visit preparation completed: Yes  Pain : No/denies pain     BMI - recorded: 31.95 Nutritional Status: BMI > 30  Obese Nutritional Risks: None Diabetes: No  Lab Results  Component Value Date   HGBA1C 5.9 01/17/2024   HGBA1C 5.7 01/16/2023   HGBA1C 5.6 08/02/2022     How often do you need to have someone help you when you read instructions, pamphlets, or other written materials  from your doctor or pharmacy?: 1 - Never  Interpreter Needed?: No  Information entered by :: Kandy Orris, CMA   Activities of Daily Living     02/26/2024    8:10 AM  In your present state of health, do you have any difficulty performing the following activities:  Hearing? 0  Vision? 0  Difficulty concentrating or making decisions? 0  Walking or climbing stairs? 0  Dressing or bathing? 0  Doing errands, shopping? 0  Preparing Food and eating ? N  Using the Toilet? N  In the past six months, have you accidently leaked urine? N  Do you have problems with loss of bowel control? N  Managing your Medications? N  Managing your Finances? N  Housekeeping or managing your Housekeeping? N    Patient Care Team: Roslyn Coombe, MD as PCP - General (Internal Medicine) Marshel Skeeters, OD as Referring Physician (Ophthalmology)  Indicate any recent Medical Services you may have received from other than [facility] providers in the past year (date may be approximate).     Assessment:   This is a routine wellness examination for Brittany Burton.  Hearing/Vision screen Hearing Screening - Comments:: Denies hearing difficulties   Vision Screening - Comments:: Wears rx glasses - up to date with routine eye exams with Dr Flordia Hung   Goals Addressed                This Visit's Progress     Weight (lb) < 200 lb (90.7 kg) (pt-stated)   192 lb (87.1 kg)     Patient stated she wants to lose weight (about 15lbs) and continue exercising.       Depression Screen     02/26/2024   10:43 AM 01/24/2024    8:14 AM 01/23/2023   10:15 AM 12/14/2022    9:06 AM 01/17/2022    1:16 PM 01/17/2022    1:02 PM 01/15/2021    9:26 AM  PHQ 2/9 Scores  PHQ - 2 Score 0 0 0 0 0 0 0  PHQ- 9 Score 0  0        Fall Risk     02/26/2024    8:11 AM 01/24/2024    8:19 AM 01/23/2023   10:15 AM 12/14/2022    9:08 AM 01/17/2022    1:16 PM  Fall Risk   Falls in the past year? 0 0 0 0 0  Number falls in past yr: 0 0 0 0 0  Injury with Fall? 0 0 0 0 0  Risk for fall due to : No Fall Risks No Fall Risks No Fall Risks No Fall Risks   Follow up Falls prevention discussed;Falls evaluation completed Falls evaluation completed Falls evaluation completed;Education provided Falls prevention discussed     MEDICARE RISK AT HOME:  Medicare Risk at Home Any stairs in or around the home?: No If so, are there any without handrails?: No Home free of loose throw rugs in walkways, pet beds, electrical cords, etc?: Yes Adequate lighting in your home to reduce risk of falls?: Yes Life alert?: No Use of a cane, walker or w/c?: No Grab bars in the bathroom?: No Shower chair or bench in shower?: No Elevated toilet seat or a handicapped toilet?: Yes  TIMED UP AND GO:  Was the test performed?  No  Cognitive Function: 6CIT completed        02/26/2024    8:12 AM 12/14/2022    9:09 AM  6CIT Screen  What Year?  0 points 0 points  What month? 0 points 0 points  What time? 0 points 0 points  Count back from 20 0 points 0 points  Months in reverse 0 points 0 points  Repeat phrase 0 points 0 points  Total Score 0 points 0 points    Immunizations Immunization History  Administered Date(s) Administered   Fluad Quad(high Dose 65+) 09/07/2022   Influenza, High Dose Seasonal PF  08/25/2023   Influenza,inj,Quad PF,6+ Mos 08/28/2018, 08/02/2019, 09/03/2020   Influenza-Unspecified 09/16/2015, 08/26/2021   PFIZER(Purple Top)SARS-COV-2 Vaccination 01/27/2020, 02/26/2020, 09/10/2020, 08/26/2021   PNEUMOCOCCAL CONJUGATE-20 01/17/2022   Pfizer(Comirnaty)Fall Seasonal Vaccine 12 years and older 09/07/2022, 08/25/2023   Respiratory Syncytial Virus Vaccine,Recomb Aduvanted(Arexvy) 09/07/2022   Td 06/03/2009   Tdap 01/07/2019   Zoster Recombinant(Shingrix) 08/29/2019, 11/25/2019   Zoster, Live 02/04/2013    Screening Tests Health Maintenance  Topic Date Due   COVID-19 Vaccine (7 - Pfizer risk 2024-25 season) 02/23/2024   Lung Cancer Screening  06/13/2024   INFLUENZA VACCINE  06/21/2024   Medicare Annual Wellness (AWV)  02/25/2025   MAMMOGRAM  09/19/2025   Colonoscopy  12/13/2027   DTaP/Tdap/Td (3 - Td or Tdap) 01/07/2029   Pneumonia Vaccine 35+ Years old  Completed   DEXA SCAN  Completed   Hepatitis C Screening  Completed   Zoster Vaccines- Shingrix  Completed   HPV VACCINES  Aged Out   Meningococcal B Vaccine  Aged Out    Health Maintenance  Health Maintenance Due  Topic Date Due   COVID-19 Vaccine (7 - Pfizer risk 2024-25 season) 02/23/2024   Health Maintenance Items Addressed: Lung Cancer Screening ordered - current smoker  Additional Screening:  Vision Screening: Recommended annual ophthalmology exams for early detection of glaucoma and other disorders of the eye.  Dental Screening: Recommended annual dental exams for proper oral hygiene  Community Resource Referral / Chronic Care Management: CRR required this visit?  No   CCM required this visit?  No     Plan:     I have personally reviewed and noted the following in the patient's chart:   Medical and social history Use of alcohol, tobacco or illicit drugs  Current medications and supplements including opioid prescriptions. Patient is not currently taking opioid prescriptions. Functional  ability and status Nutritional status Physical activity Advanced directives List of other physicians Hospitalizations, surgeries, and ER visits in previous 12 months Vitals Screenings to include cognitive, depression, and falls Referrals and appointments  In addition, I have reviewed and discussed with patient certain preventive protocols, quality metrics, and best practice recommendations. A written personalized care plan for preventive services as well as general preventive health recommendations were provided to patient.     Patria Bookbinder, CMA   03/04/2024   After Visit Summary: (MyChart) Due to this being a telephonic visit, the after visit summary with patients personalized plan was offered to patient via MyChart   Notes: Nothing significant to report at this time.

## 2024-06-14 ENCOUNTER — Ambulatory Visit
Admission: RE | Admit: 2024-06-14 | Discharge: 2024-06-14 | Disposition: A | Discharge status: Home / Self Care | Source: Ambulatory Visit | Attending: Acute Care | Admitting: Acute Care

## 2024-06-14 DIAGNOSIS — F17210 Nicotine dependence, cigarettes, uncomplicated: Secondary | ICD-10-CM

## 2024-06-14 DIAGNOSIS — Z87891 Personal history of nicotine dependence: Secondary | ICD-10-CM

## 2024-06-14 DIAGNOSIS — Z122 Encounter for screening for malignant neoplasm of respiratory organs: Secondary | ICD-10-CM | POA: Diagnosis not present

## 2024-06-24 ENCOUNTER — Other Ambulatory Visit: Payer: Self-pay

## 2024-06-24 DIAGNOSIS — F17210 Nicotine dependence, cigarettes, uncomplicated: Secondary | ICD-10-CM

## 2024-06-24 DIAGNOSIS — Z87891 Personal history of nicotine dependence: Secondary | ICD-10-CM

## 2024-06-24 DIAGNOSIS — Z122 Encounter for screening for malignant neoplasm of respiratory organs: Secondary | ICD-10-CM

## 2024-07-19 DIAGNOSIS — H5213 Myopia, bilateral: Secondary | ICD-10-CM | POA: Diagnosis not present

## 2024-07-26 DIAGNOSIS — Z0100 Encounter for examination of eyes and vision without abnormal findings: Secondary | ICD-10-CM | POA: Diagnosis not present

## 2024-08-26 DIAGNOSIS — Z86018 Personal history of other benign neoplasm: Secondary | ICD-10-CM | POA: Diagnosis not present

## 2024-08-26 DIAGNOSIS — L309 Dermatitis, unspecified: Secondary | ICD-10-CM | POA: Diagnosis not present

## 2024-08-26 DIAGNOSIS — L821 Other seborrheic keratosis: Secondary | ICD-10-CM | POA: Diagnosis not present

## 2024-08-26 DIAGNOSIS — L814 Other melanin hyperpigmentation: Secondary | ICD-10-CM | POA: Diagnosis not present

## 2024-08-26 DIAGNOSIS — L570 Actinic keratosis: Secondary | ICD-10-CM | POA: Diagnosis not present

## 2024-08-26 DIAGNOSIS — Z85828 Personal history of other malignant neoplasm of skin: Secondary | ICD-10-CM | POA: Diagnosis not present

## 2024-08-26 DIAGNOSIS — D225 Melanocytic nevi of trunk: Secondary | ICD-10-CM | POA: Diagnosis not present

## 2024-08-29 NOTE — Progress Notes (Signed)
 67 y.o. G0P0000 postmenopausal female here for annual exam. Married. PCP: Norleen Lynwood ORN, MD   She reports no concerns.  Postmenopausal bleeding: none Pelvic discharge or pain: none Breast mass, nipple discharge or skin changes : none Sexually active: yes, some dyspareunia, infrequent   Last PAP:     Component Value Date/Time   DIAGPAP  07/14/2021 1635    - Negative for intraepithelial lesion or malignancy (NILM)   HPVHIGH Negative 07/14/2021 1635   ADEQPAP  07/14/2021 1635    Satisfactory for evaluation; transformation zone component PRESENT.   Last mammogram: 09/20/23 BIRADS 1, density b Last DXA: 07/28/21 osteopenia, DUE Last colonoscopy: 12/12/17 q46yr  Exercising: golf 2-3x/wk Smoker: current  [employer] Visit from 09/02/2024 in [facility]  PHQ-2 Total Score 0    Flowsheet Row Clinical Support from 02/26/2024 in [facility]  PHQ-9 Total Score 0     GYN HISTORY: No sig hx  OB History  Gravida Para Term Preterm AB Living  0 0 0 0 0 0  SAB IAB Ectopic Multiple Live Births  0 0 0 0 0   Past Medical History:  Diagnosis Date   BACK PAIN 03/18/2010   COPD (chronic obstructive pulmonary disease) (HCC) 05/18/2017   GERD 06/03/2009   HYPERLIPIDEMIA 06/03/2009   Kidney stone    LEG PAIN, RIGHT 03/18/2010   Osteopenia 06/2018   T score -1.8 FRAX 9.4% / 1.8%   PAF (paroxysmal atrial fibrillation) (HCC) 09/04/2011   POSTURAL LIGHTHEADEDNESS 06/17/2010   Vertigo    chronic recurrent, BPV, probl left related   VITAMIN D  DEFICIENCY 06/03/2009   History reviewed. No pertinent surgical history. Current Outpatient Medications on File Prior to Visit  Medication Sig Dispense Refill   aspirin  81 MG EC tablet Take 1 tablet (81 mg total) by mouth daily. Swallow whole. 30 tablet 12   meclizine  (ANTIVERT ) 25 MG tablet Take 1 tablet (25 mg total) by mouth daily as needed. 90 tablet 3   rosuvastatin   (CRESTOR ) 20 MG tablet Take 1 tablet (20 mg total) by mouth daily. 90 tablet 3   No current facility-administered medications on file prior to visit.   Social History   Socioeconomic History   Marital status: Married    Spouse name: Not on file   Number of children: 0   Years of education: Not on file   Highest education level: Bachelor's degree (e.g., BA, AB, BS)  Occupational History   Occupation: [employer]    Employer: [employer]  Tobacco Use   Smoking status: Every Day    Current packs/day: 1.75    Average packs/day: 1.8 packs/day for 49.8 years (87.1 ttl pk-yrs)    Types: Cigarettes    Start date: 11/21/1974    Passive exposure: Current   Smokeless tobacco: Never  Vaping Use   Vaping status: Never Used  Substance and Sexual Activity   Alcohol use: No    Comment: OCC   Drug use: No   Sexual activity: Yes    Birth control/protection: Post-menopausal    Comment: 1st intercourse 68 yo-Fewer than 5 partners,des neg  Other Topics Concern   Not on file  Social History Narrative   Married   Current smoker   Social Drivers of Corporate investment banker Strain: Low Risk  (02/26/2024)   Overall Financial Resource Strain (CARDIA)    Difficulty of Paying Living Expenses: Not hard at all  Food Insecurity: No Food Insecurity (  02/26/2024)   Hunger Vital Sign    Worried About Running Out of Food in the Last Year: Never true    Ran Out of Food in the Last Year: Never true  Transportation Needs: No Transportation Needs (02/26/2024)   PRAPARE - Administrator, Civil Service (Medical): No    Lack of Transportation (Non-Medical): No  Physical Activity: Sufficiently Active (02/26/2024)   Exercise Vital Sign    Days of Exercise per Week: 3 days    Minutes of Exercise per Session: 60 min  Recent Concern: Physical Activity - Insufficiently Active (01/17/2024)   Exercise Vital Sign    Days of Exercise per Week: 2 days    Minutes of Exercise per Session: 60 min  Stress: No  Stress Concern Present (02/26/2024)   [employer] of Occupational Health - Occupational Stress Questionnaire    Feeling of Stress : Not at all  Recent Concern: Stress - Stress Concern Present (01/17/2024)   [employer] of Occupational Health - Occupational Stress Questionnaire    Feeling of Stress : To some extent  Social Connections: Moderately Integrated (02/26/2024)   Social Connection and Isolation Panel    Frequency of Communication with Friends and Family: More than three times a week    Frequency of Social Gatherings with Friends and Family: More than three times a week    Attends Religious Services: Never    Database administrator or Organizations: Yes    Attends Engineer, structural: More than 4 times per year    Marital Status: Married  Catering manager Violence: Not At Risk (02/26/2024)   Humiliation, Afraid, Rape, and Kick questionnaire    Fear of Current or Ex-Partner: No    Emotionally Abused: No    Physically Abused: No    Sexually Abused: No   Family History  Problem Relation Age of Onset   Melanoma Father    Esophageal cancer Father    Ovarian cancer Sister 21       stage 3   Melanoma Sister    Cancer Paternal Grandmother        colon late 70's   Colon cancer Paternal Grandmother    Pancreatic cancer Neg Hx    Rectal cancer Neg Hx    Stomach cancer Neg Hx    No Known Allergies    PE Today's Vitals   09/02/24 1331  BP: 118/78  Pulse: 77  Temp: 97.8 F (36.6 C)  TempSrc: Oral  SpO2: 94%  Weight: 187 lb (84.8 kg)  Height: 5' 4.5 (1.638 m)   Body mass index is 31.6 kg/m.  Physical Exam Vitals reviewed. Exam conducted with a chaperone present.  Constitutional:      General: She is not in acute distress.    Appearance: Normal appearance.  HENT:     Head: Normocephalic and atraumatic.     Nose: Nose normal.  Eyes:     Extraocular Movements: Extraocular movements intact.     Conjunctiva/sclera: Conjunctivae normal.  Pulmonary:      Effort: Pulmonary effort is normal.  Chest:     Chest wall: No mass or tenderness.  Breasts:    Right: Normal. No swelling, mass, nipple discharge, skin change or tenderness.     Left: Normal. No swelling, mass, nipple discharge, skin change or tenderness.  Abdominal:     General: There is no distension.     Palpations: Abdomen is soft.     Tenderness: There is no abdominal tenderness.  Genitourinary:  General: Normal vulva.     Exam position: Lithotomy position.     Urethra: No prolapse.     Vagina: Normal. No vaginal discharge or bleeding.     Cervix: Normal. No lesion.     Uterus: Normal. Not enlarged and not tender.      Adnexa: Right adnexa normal and left adnexa normal.  Musculoskeletal:        General: Normal range of motion.     Cervical back: Normal range of motion.  Lymphadenopathy:     Upper Body:     Right upper body: No axillary adenopathy.     Left upper body: No axillary adenopathy.     Lower Body: No right inguinal adenopathy. No left inguinal adenopathy.  Skin:    General: Skin is warm and dry.  Neurological:     General: No focal deficit present.     Mental Status: She is alert.  Psychiatric:        Mood and Affect: Mood normal.        Behavior: Behavior normal.       Assessment and Plan:        Encounter for breast and pelvic examination Assessment & Plan: Cervical cancer screening performed according to ASCCP guidelines. Encouraged annual mammogram screening Colonoscopy UTD DXA due Labs and immunizations with her primary Encouraged safe sexual practices as indicated Encouraged healthy lifestyle practices with diet and exercise For patients under 50-70yo, I recommend 1200mg  calcium  daily and 600IU of vitamin D  daily.    Osteopenia, unspecified location Assessment & Plan: Continue vitamin D +Calcium  Encouraged weight based exercise DXA due, ordered   Orders: -     DG Bone Density; Future  Negative depression screening   Vera LULLA Pa, MD

## 2024-09-02 ENCOUNTER — Encounter: Payer: Self-pay | Admitting: Obstetrics and Gynecology

## 2024-09-02 ENCOUNTER — Ambulatory Visit: Payer: Medicare Other | Admitting: Obstetrics and Gynecology

## 2024-09-02 VITALS — BP 118/78 | HR 77 | Temp 97.8°F | Ht 64.5 in | Wt 187.0 lb

## 2024-09-02 DIAGNOSIS — M8580 Other specified disorders of bone density and structure, unspecified site: Secondary | ICD-10-CM | POA: Insufficient documentation

## 2024-09-02 DIAGNOSIS — Z01419 Encounter for gynecological examination (general) (routine) without abnormal findings: Secondary | ICD-10-CM | POA: Insufficient documentation

## 2024-09-02 DIAGNOSIS — Z1331 Encounter for screening for depression: Secondary | ICD-10-CM | POA: Diagnosis not present

## 2024-09-02 NOTE — Assessment & Plan Note (Signed)
 Cervical cancer screening performed according to ASCCP guidelines. Encouraged annual mammogram screening Colonoscopy UTD DXA due Labs and immunizations with her primary Encouraged safe sexual practices as indicated Encouraged healthy lifestyle practices with diet and exercise For patients under 50-68yo, I recommend 1200mg  calcium daily and 600IU of vitamin D daily.

## 2024-09-02 NOTE — Assessment & Plan Note (Signed)
 Continue vitamin D+Calcium Encouraged weight based exercise DXA due, ordered

## 2024-09-02 NOTE — Patient Instructions (Signed)
 For patients under 50-68yo, I recommend 1200mg  calcium  daily and 600IU of vitamin D daily. For patients over 68yo, I recommend 1200mg  calcium  daily and 800IU of vitamin D daily.  Health Maintenance, Female Adopting a healthy lifestyle and getting preventive care are important in promoting health and wellness. Ask your health care provider about: The right schedule for you to have regular tests and exams. Things you can do on your own to prevent diseases and keep yourself healthy. What should I know about diet, weight, and exercise? Eat a healthy diet  Eat a diet that includes plenty of vegetables, fruits, low-fat dairy products, and lean protein. Do not eat a lot of foods that are high in solid fats, added sugars, or sodium. Maintain a healthy weight Body mass index (BMI) is used to identify weight problems. It estimates body fat based on height and weight. Your health care provider can help determine your BMI and help you achieve or maintain a healthy weight. Get regular exercise Get regular exercise. This is one of the most important things you can do for your health. Most adults should: Exercise for at least 150 minutes each week. The exercise should increase your heart rate and make you sweat (moderate-intensity exercise). Do strengthening exercises at least twice a week. This is in addition to the moderate-intensity exercise. Spend less time sitting. Even light physical activity can be beneficial. Watch cholesterol and blood lipids Have your blood tested for lipids and cholesterol at 68 years of age, then have this test every 5 years. Have your cholesterol levels checked more often if: Your lipid or cholesterol levels are high. You are older than 68 years of age. You are at high risk for heart disease. What should I know about cancer screening? Depending on your health history and family history, you may need to have cancer screening at various ages. This may include screening  for: Breast cancer. Cervical cancer. Colorectal cancer. Skin cancer. Lung cancer. What should I know about heart disease, diabetes, and high blood pressure? Blood pressure and heart disease High blood pressure causes heart disease and increases the risk of stroke. This is more likely to develop in people who have high blood pressure readings or are overweight. Have your blood pressure checked: Every 3-5 years if you are 25-57 years of age. Every year if you are 24 years old or older. Diabetes Have regular diabetes screenings. This checks your fasting blood sugar level. Have the screening done: Once every three years after age 62 if you are at a normal weight and have a low risk for diabetes. More often and at a younger age if you are overweight or have a high risk for diabetes. What should I know about preventing infection? Hepatitis B If you have a higher risk for hepatitis B, you should be screened for this virus. Talk with your health care provider to find out if you are at risk for hepatitis B infection. Hepatitis C Testing is recommended for: Everyone born from [birth year] through 1965. Anyone with known risk factors for hepatitis C. Sexually transmitted infections (STIs) Get screened for STIs, including gonorrhea and chlamydia, if: You are sexually active and are younger than 68 years of age. You are older than 68 years of age and your health care provider tells you that you are at risk for this type of infection. Your sexual activity has changed since you were last screened, and you are at increased risk for chlamydia or gonorrhea. Ask your health care provider if  you are at risk. Ask your health care provider about whether you are at high risk for HIV. Your health care provider may recommend a prescription medicine to help prevent HIV infection. If you choose to take medicine to prevent HIV, you should first get tested for HIV. You should then be tested every 3 months for as long as you  are taking the medicine. Osteoporosis and menopause Osteoporosis is a disease in which the bones lose minerals and strength with aging. This can result in bone fractures. If you are 72 years old or older, or if you are at risk for osteoporosis and fractures, ask your health care provider if you should: Be screened for bone loss. Take a calcium  or vitamin D supplement to lower your risk of fractures. Be given hormone replacement therapy (HRT) to treat symptoms of menopause. Follow these instructions at home: Alcohol use Do not drink alcohol if: Your health care provider tells you not to drink. You are pregnant, may be pregnant, or are planning to become pregnant. If you drink alcohol: Limit how much you have to: 0-1 drink a day. Know how much alcohol is in your drink. In the [place] one drink equals one 12 oz bottle of beer (355 mL), one 5 oz glass of wine (148 mL), or one 1 oz glass of hard liquor (44 mL). Lifestyle Do not use any products that contain nicotine or tobacco. These products include cigarettes, chewing tobacco, and vaping devices, such as e-cigarettes. If you need help quitting, ask your health care provider. Do not use street drugs. Do not share needles. Ask your health care provider for help if you need support or information about quitting drugs. General instructions Schedule regular health, dental, and eye exams. Stay current with your vaccines. Tell your health care provider if: You often feel depressed. You have ever been abused or do not feel safe at home. Summary Adopting a healthy lifestyle and getting preventive care are important in promoting health and wellness. Follow your health care provider's instructions about healthy diet, exercising, and getting tested or screened for diseases. Follow your health care provider's instructions on monitoring your cholesterol and blood pressure. This information is not intended to replace advice given to you by your health  care provider. Make sure you discuss any questions you have with your health care provider. Document Revised: 03/29/2021 Document Reviewed: 03/29/2021 Elsevier Patient Education  2024 [employer].

## 2024-09-10 NOTE — Progress Notes (Unsigned)
     Thea Holshouser T. Latrisha Coiro, MD, CAQ Sports Medicine [facility] at [facility] [address] [place], [address]  Phone: [phone]  FAX: [phone]  Brittany Burton - 68 y.o. female  MRN [id number]  Date of Birth: 1956-10-26  Date: 09/11/2024  PCP: Norleen Lynwood ORN, MD  Referral: Norleen Lynwood ORN, MD  No chief complaint on file.  Subjective:   Brittany Burton is a 68 y.o. very pleasant female patient with There is no height or weight on file to calculate BMI. who presents with the following:  Discussed the use of AI scribe software for clinical note transcription with the patient, who gave verbal consent to proceed.  Patient presents ongoing left-sided shoulder pain.  She is a patient of Dr. Norleen. History of Present Illness     Review of Systems is noted in the HPI, as appropriate  Objective:   There were no vitals taken for this visit.  GEN: No acute distress; alert,appropriate. PULM: Breathing comfortably in no respiratory distress PSYCH: Normally interactive.   Laboratory and Imaging Data:  Assessment and Plan:   No diagnosis found. Assessment & Plan   Medication Management during today's office visit: No orders of the defined types were placed in this encounter.  There are no discontinued medications.  Orders placed today for conditions managed today: No orders of the defined types were placed in this encounter.   Disposition: No follow-ups on file.  Dragon [employer] One speech-to-text software was used for transcription in this dictation.  Possible transcriptional errors can occur using Animal nutritionist.   Signed,  Jacques DASEN. Aniel Hubble, MD   Outpatient Encounter Medications as of 09/11/2024  Medication Sig   aspirin  81 MG EC tablet Take 1 tablet (81 mg total) by mouth daily. Swallow whole.   meclizine  (ANTIVERT ) 25 MG tablet Take 1 tablet (25 mg total) by mouth daily as needed.   rosuvastatin  (CRESTOR ) 20 MG tablet Take 1  tablet (20 mg total) by mouth daily.   No facility-administered encounter medications on file as of 09/11/2024.

## 2024-09-11 ENCOUNTER — Ambulatory Visit: Admitting: Family Medicine

## 2024-09-11 ENCOUNTER — Ambulatory Visit
Admission: RE | Admit: 2024-09-11 | Discharge: 2024-09-11 | Disposition: A | Source: Ambulatory Visit | Attending: Family Medicine | Admitting: Family Medicine

## 2024-09-11 ENCOUNTER — Encounter: Payer: Self-pay | Admitting: Family Medicine

## 2024-09-11 VITALS — BP 128/80 | HR 74 | Temp 98.0°F | Ht 65.0 in | Wt 191.5 lb

## 2024-09-11 DIAGNOSIS — M25512 Pain in left shoulder: Secondary | ICD-10-CM | POA: Diagnosis not present

## 2024-09-11 DIAGNOSIS — M7502 Adhesive capsulitis of left shoulder: Secondary | ICD-10-CM

## 2024-09-11 DIAGNOSIS — M19012 Primary osteoarthritis, left shoulder: Secondary | ICD-10-CM | POA: Diagnosis not present

## 2024-09-12 ENCOUNTER — Encounter: Payer: Self-pay | Admitting: Family Medicine

## 2024-09-12 ENCOUNTER — Ambulatory Visit: Payer: Self-pay | Admitting: Family Medicine

## 2024-09-12 DIAGNOSIS — M25512 Pain in left shoulder: Secondary | ICD-10-CM | POA: Diagnosis not present

## 2024-09-12 DIAGNOSIS — M7502 Adhesive capsulitis of left shoulder: Secondary | ICD-10-CM | POA: Diagnosis not present

## 2024-09-12 MED ORDER — TRIAMCINOLONE ACETONIDE 40 MG/ML IJ SUSP
40.0000 mg | Freq: Once | INTRAMUSCULAR | Status: AC
Start: 2024-09-12 — End: 2024-09-12
  Administered 2024-09-12: 40 mg via INTRA_ARTICULAR

## 2024-09-12 NOTE — Addendum Note (Signed)
 Addended by: WENDELL ARLAND RAMAN on: 09/12/2024 10:00 AM   Modules accepted: Orders

## 2024-09-25 DIAGNOSIS — Z1231 Encounter for screening mammogram for malignant neoplasm of breast: Secondary | ICD-10-CM | POA: Diagnosis not present

## 2024-09-25 LAB — HM MAMMOGRAPHY

## 2024-09-26 ENCOUNTER — Encounter: Payer: Self-pay | Admitting: Internal Medicine

## 2024-11-10 NOTE — Progress Notes (Unsigned)
" ° ° ° °  Melchor Kirchgessner T. Skai Lickteig, MD, CAQ Sports Medicine [facility] [address] [place], [address]  Phone: [phone]  FAX: [phone]  Brittany Burton - 68 y.o. female  MRN [id number]  Date of Birth: 1956/06/15  Date: 11/11/2024  PCP: Norleen Lynwood ORN, MD  Referral: Norleen Lynwood ORN, MD  No chief complaint on file.  Subjective:   Brittany Burton is a 68 y.o. very pleasant female patient with There is no height or weight on file to calculate BMI. who presents with the following:  Discussed the use of AI scribe software for clinical note transcription with the patient, who gave verbal consent to proceed.  Pleasant female patient presents for follow-up left-sided frozen shoulder.  At that time, she had marked restriction of motion in all directions.  I had her start on [facility] frozen shoulder protocol, we also did an intra-articular injection of the left shoulder. History of Present Illness     Review of Systems is noted in the HPI, as appropriate  Objective:   There were no vitals taken for this visit.  GEN: No acute distress; alert,appropriate. PULM: Breathing comfortably in no respiratory distress PSYCH: Normally interactive.   Laboratory and Imaging Data:  Assessment and Plan:   No diagnosis found. Assessment & Plan   Medication Management during today's office visit: No orders of the defined types were placed in this encounter.  There are no discontinued medications.  Orders placed today for conditions managed today: No orders of the defined types were placed in this encounter.   Disposition: No follow-ups on file.  Dr[employer] One speech-to-text software was used for transcription in this dictation.  Possible transcriptional errors can occur using Animal nutritionist.   Signed,  Jacques DASEN. Cailah Reach, MD   Outpatient Encounter Medications as of 11/11/2024  Medication Sig   aspirin  81 MG EC tablet Take 1 tablet (81  mg total) by mouth daily. Swallow whole.   meclizine  (ANTIVERT ) 25 MG tablet Take 1 tablet (25 mg total) by mouth daily as needed.   rosuvastatin  (CRESTOR ) 20 MG tablet Take 1 tablet (20 mg total) by mouth daily.   No facility-administered encounter medications on file as of 11/11/2024.   "

## 2024-11-11 ENCOUNTER — Encounter: Payer: Self-pay | Admitting: Family Medicine

## 2024-11-11 ENCOUNTER — Ambulatory Visit: Admitting: Family Medicine

## 2024-11-11 VITALS — BP 120/74 | HR 85 | Temp 97.3°F | Ht 65.0 in | Wt 193.2 lb

## 2024-11-11 DIAGNOSIS — M7502 Adhesive capsulitis of left shoulder: Secondary | ICD-10-CM

## 2024-11-18 ENCOUNTER — Encounter: Payer: Self-pay | Admitting: Internal Medicine

## 2024-11-20 ENCOUNTER — Other Ambulatory Visit (INDEPENDENT_AMBULATORY_CARE_PROVIDER_SITE_OTHER)

## 2024-11-20 DIAGNOSIS — R739 Hyperglycemia, unspecified: Secondary | ICD-10-CM

## 2024-11-20 DIAGNOSIS — E7800 Pure hypercholesterolemia, unspecified: Secondary | ICD-10-CM

## 2024-11-20 DIAGNOSIS — E559 Vitamin D deficiency, unspecified: Secondary | ICD-10-CM

## 2024-11-20 DIAGNOSIS — E538 Deficiency of other specified B group vitamins: Secondary | ICD-10-CM | POA: Diagnosis not present

## 2024-11-20 LAB — URINALYSIS, ROUTINE W REFLEX MICROSCOPIC
Bilirubin Urine: NEGATIVE
Hgb urine dipstick: NEGATIVE
Ketones, ur: NEGATIVE
Leukocytes,Ua: NEGATIVE
Nitrite: NEGATIVE
RBC / HPF: NONE SEEN
Specific Gravity, Urine: >=1.03 — AB (ref 1.000–1.030)
Total Protein, Urine: NEGATIVE
Urine Glucose: NEGATIVE
Urobilinogen, UA: 0.2 (ref 0.0–1.0)
pH: 5.5 (ref 5.0–8.0)

## 2024-11-20 LAB — LIPID PANEL
Cholesterol: 153 mg/dL (ref 28–200)
HDL: 47.8 mg/dL
LDL Cholesterol: 79 mg/dL (ref 10–99)
NonHDL: 105.46
Total CHOL/HDL Ratio: 3
Triglycerides: 131 mg/dL (ref 10.0–149.0)
VLDL: 26.2 mg/dL (ref 0.0–40.0)

## 2024-11-20 LAB — HEPATIC FUNCTION PANEL
ALT: 40 U/L — ABNORMAL HIGH (ref 3–35)
AST: 32 U/L (ref 5–37)
Albumin: 4.2 g/dL (ref 3.5–5.2)
Alkaline Phosphatase: 71 U/L (ref 39–117)
Bilirubin, Direct: 0.2 mg/dL (ref 0.1–0.3)
Total Bilirubin: 1.1 mg/dL (ref 0.2–1.2)
Total Protein: 6.6 g/dL (ref 6.0–8.3)

## 2024-11-20 LAB — BASIC METABOLIC PANEL WITH GFR
BUN: 16 mg/dL (ref 6–23)
CO2: 28 meq/L (ref 19–32)
Calcium: 9.3 mg/dL (ref 8.4–10.5)
Chloride: 105 meq/L (ref 96–112)
Creatinine, Ser: 0.67 mg/dL (ref 0.40–1.20)
GFR: 89.81 mL/min
Glucose, Bld: 102 mg/dL — ABNORMAL HIGH (ref 70–99)
Potassium: 4.8 meq/L (ref 3.5–5.1)
Sodium: 140 meq/L (ref 135–145)

## 2024-11-20 LAB — CBC WITH DIFFERENTIAL/PLATELET
Basophils Absolute: 0 K/uL (ref 0.0–0.1)
Basophils Relative: 0.4 % (ref 0.0–3.0)
Eosinophils Absolute: 0.1 K/uL (ref 0.0–0.7)
Eosinophils Relative: 1.8 % (ref 0.0–5.0)
HCT: 43.7 % (ref 36.0–46.0)
Hemoglobin: 15 g/dL (ref 12.0–15.0)
Lymphocytes Relative: 29.6 % (ref 12.0–46.0)
Lymphs Abs: 1.6 K/uL (ref 0.7–4.0)
MCHC: 34.3 g/dL (ref 30.0–36.0)
MCV: 96.5 fl (ref 78.0–100.0)
Monocytes Absolute: 0.5 K/uL (ref 0.1–1.0)
Monocytes Relative: 9.6 % (ref 3.0–12.0)
Neutro Abs: 3.2 K/uL (ref 1.4–7.7)
Neutrophils Relative %: 58.6 % (ref 43.0–77.0)
Platelets: 192 K/uL (ref 150.0–400.0)
RBC: 4.52 Mil/uL (ref 3.87–5.11)
RDW: 13.4 % (ref 11.5–15.5)
WBC: 5.5 K/uL (ref 4.0–10.5)

## 2024-11-20 LAB — VITAMIN B12: Vitamin B-12: 598 pg/mL (ref 211–911)

## 2024-11-20 LAB — TSH: TSH: 3.67 u[IU]/mL (ref 0.35–5.50)

## 2024-11-20 LAB — VITAMIN D 25 HYDROXY (VIT D DEFICIENCY, FRACTURES): VITD: 38.11 ng/mL (ref 30.00–100.00)

## 2024-11-20 LAB — HEMOGLOBIN A1C: Hgb A1c MFr Bld: 5.7 % (ref 4.6–6.5)

## 2024-11-27 ENCOUNTER — Ambulatory Visit: Admitting: Internal Medicine

## 2024-11-27 ENCOUNTER — Encounter: Payer: Self-pay | Admitting: Internal Medicine

## 2024-11-27 VITALS — BP 122/78 | HR 72 | Temp 98.6°F | Ht 65.0 in | Wt 195.0 lb

## 2024-11-27 DIAGNOSIS — E559 Vitamin D deficiency, unspecified: Secondary | ICD-10-CM

## 2024-11-27 DIAGNOSIS — J449 Chronic obstructive pulmonary disease, unspecified: Secondary | ICD-10-CM

## 2024-11-27 DIAGNOSIS — E7800 Pure hypercholesterolemia, unspecified: Secondary | ICD-10-CM | POA: Diagnosis not present

## 2024-11-27 DIAGNOSIS — L299 Pruritus, unspecified: Secondary | ICD-10-CM

## 2024-11-27 DIAGNOSIS — M79671 Pain in right foot: Secondary | ICD-10-CM

## 2024-11-27 DIAGNOSIS — M79672 Pain in left foot: Secondary | ICD-10-CM

## 2024-11-27 MED ORDER — ROSUVASTATIN CALCIUM 20 MG PO TABS: 20.0000 mg | ORAL_TABLET | Freq: Every day | ORAL | 3 refills | Status: AC

## 2024-11-27 MED ORDER — PREDNISONE 10 MG PO TABS: ORAL_TABLET | ORAL | 0 refills | Status: AC

## 2024-11-27 NOTE — Progress Notes (Signed)
 Patient ID: Brittany Burton, female   DOB: [birth year], 69 y.o.   MRN: [id number]        Chief Complaint: follow up pruritus, hld, low vit d, bilat feet pain       HPI:  Brittany Burton is a 69 y.o. female here with c/o diffuse itching all over that just wont stop over the past wk, cannot sleep well.  No rash, swelling.  Pt denies chest pain, increased sob or doe, wheezing, orthopnea, PND, increased LE swelling, palpitations, dizziness or syncope.   Pt denies polydipsia, polyuria, or new focal neuro s/s.    Pt denies fever, wt loss, night sweats, loss of appetite, or other constitutional symptoms  Is taking Vit D once per wk.  Also has ill defined bilateral feet pain just not improving despite other ortho evalaution.         Wt Readings from Last 3 Encounters:  11/27/24 195 lb (88.5 kg)  11/11/24 193 lb 4 oz (87.7 kg)  09/11/24 191 lb 8 oz (86.9 kg)   BP Readings from Last 3 Encounters:  11/27/24 122/78  11/11/24 120/74  09/11/24 128/80         Past Medical History:  Diagnosis Date   BACK PAIN 03/18/2010   COPD (chronic obstructive pulmonary disease) (HCC) 05/18/2017   GERD 06/03/2009   HYPERLIPIDEMIA 06/03/2009   Kidney stone    LEG PAIN, RIGHT 03/18/2010   Osteopenia 06/2018   T score -1.8 FRAX 9.4% / 1.8%   PAF (paroxysmal atrial fibrillation) (HCC) 09/04/2011   POSTURAL LIGHTHEADEDNESS 06/17/2010   Vertigo    chronic recurrent, BPV, probl left related   VITAMIN D  DEFICIENCY 06/03/2009   History reviewed. No pertinent surgical history.  reports that she has been smoking cigarettes. She started smoking about 50 years ago. She has a 87.5 pack-year smoking history. She has been exposed to tobacco smoke. She has never used smokeless tobacco. She reports that she does not drink alcohol and does not use drugs. family history includes Cancer in her paternal grandmother; Colon cancer in her paternal grandmother; Esophageal cancer in her father; Melanoma in her father and sister;  Ovarian cancer (age of onset: 64) in her sister. Allergies[1] Medications Ordered Prior to Encounter[2]      ROS:  All others reviewed and negative.  Objective        PE:  BP 122/78 (BP Location: Right Arm, Patient Position: Sitting, Cuff Size: Normal)   Pulse 72   Temp 98.6 F (37 C) (Oral)   Ht 5' 5 (1.651 m)   Wt 195 lb (88.5 kg)   SpO2 95%   BMI 32.45 kg/m                 Constitutional: Pt appears in NAD               HENT: Head: NCAT.                Right Ear: External ear normal.                 Left Ear: External ear normal.                Eyes: . Pupils are equal, round, and reactive to light. Conjunctivae and EOM are normal               Nose: without d/c or deformity               Neck: Neck supple. Sheldon  normal ROM               Cardiovascular: Normal rate and regular rhythm.                 Pulmonary/Chest: Effort normal and breath sounds without rales or wheezing.                Abd:  Soft, NT, ND, + BS, no organomegaly               Neurological: Pt is alert. At baseline orientation, motor grossly intact               Skin: Skin is warm. No rashes, no other new lesions, LE edema - none               Psychiatric: Pt behavior is normal without agitation   Micro: none  Cardiac tracings I have personally interpreted today:  none  Pertinent Radiological findings (summarize): none   Lab Results  Component Value Date   WBC 5.5 11/20/2024   HGB 15.0 11/20/2024   HCT 43.7 11/20/2024   PLT 192.0 11/20/2024   GLUCOSE 102 (H) 11/20/2024   CHOL 153 11/20/2024   TRIG 131.0 11/20/2024   HDL 47.80 11/20/2024   LDLDIRECT 145.2 01/28/2013   LDLCALC 79 11/20/2024   ALT 40 (H) 11/20/2024   AST 32 11/20/2024   NA 140 11/20/2024   K 4.8 11/20/2024   CL 105 11/20/2024   CREATININE 0.67 11/20/2024   BUN 16 11/20/2024   CO2 28 11/20/2024   TSH 3.67 11/20/2024   INR 1.0 08/02/2022   HGBA1C 5.7 11/20/2024   Assessment/Plan:  Brittany Burton is a 69 y.o. White or  Caucasian [1] female with  has a past medical history of BACK PAIN (03/18/2010), COPD (chronic obstructive pulmonary disease) (HCC) (05/18/2017), GERD (06/03/2009), HYPERLIPIDEMIA (06/03/2009), Kidney stone, LEG PAIN, RIGHT (03/18/2010), Osteopenia (06/2018), PAF (paroxysmal atrial fibrillation) (HCC) (09/04/2011), POSTURAL LIGHTHEADEDNESS (06/17/2010), Vertigo, and VITAMIN D  DEFICIENCY (06/03/2009).  Vitamin D  deficiency Last vitamin D  Lab Results  Component Value Date   VD25OH 38.11 11/20/2024   Low to increase oral replacement to 2000 units qod   Pruritus Exam benign but likely allergic related - for prednisone  taper, consider allergy referral  HLD (hyperlipidemia) Lab Results  Component Value Date   LDLCALC 79 11/20/2024   uncontrolled, pt to continue current statin crestor  20 mg every day, declines other change for now   Bilateral foot pain Etiology unclear, for ortho referral Dr Kit  COPD (chronic obstructive pulmonary disease) (HCC) Stable overall, cont inhaler prn asd  Followup: Return in about 1 year (around 11/27/2025).  Lynwood Rush, MD 11/28/2024 9:16 PM [facility] Internal Medicine     [1] No Known Allergies [2]  Current Outpatient Medications on File Prior to Visit  Medication Sig Dispense Refill   aspirin  81 MG EC tablet Take 1 tablet (81 mg total) by mouth daily. Swallow whole. 30 tablet 12   meclizine  (ANTIVERT ) 25 MG tablet Take 1 tablet (25 mg total) by mouth daily as needed. 90 tablet 3   No current facility-administered medications on file prior to visit.

## 2024-11-27 NOTE — Patient Instructions (Addendum)
 Please take all new medication as prescribed- the prednisone   Ok to increase the Vit D to every other day  Please continue all other medications as before, and refills have been done if requested.  Please have the pharmacy call with any other refills you may need.  Please continue your efforts at being more active, low cholesterol diet, and weight control.  Please keep your appointments with your specialists as you may have planned  You will be contacted regarding the referral for: [facility] for feet pain  Please make an Appointment to return for your 1 year visit, or sooner if needed

## 2024-11-28 ENCOUNTER — Encounter: Payer: Self-pay | Admitting: Internal Medicine

## 2024-11-28 DIAGNOSIS — M79671 Pain in right foot: Secondary | ICD-10-CM | POA: Insufficient documentation

## 2024-11-28 NOTE — Assessment & Plan Note (Signed)
 Last vitamin D  Lab Results  Component Value Date   VD25OH 38.11 11/20/2024   Low to increase oral replacement to 2000 units qod

## 2024-11-28 NOTE — Assessment & Plan Note (Signed)
 Stable overall, cont inhaler prn asd

## 2024-11-28 NOTE — Assessment & Plan Note (Signed)
 Exam benign but likely allergic related - for prednisone  taper, consider allergy referral

## 2024-11-28 NOTE — Assessment & Plan Note (Signed)
 Etiology unclear, for ortho referral Dr Kit

## 2024-11-28 NOTE — Assessment & Plan Note (Signed)
 Lab Results  Component Value Date   LDLCALC 79 11/20/2024   uncontrolled, pt to continue current statin crestor  20 mg every day, declines other change for now

## 2024-12-04 ENCOUNTER — Encounter: Payer: Self-pay | Admitting: Internal Medicine

## 2025-01-27 ENCOUNTER — Encounter: Admitting: Internal Medicine

## 2025-02-28 ENCOUNTER — Ambulatory Visit

## 2025-03-05 ENCOUNTER — Encounter: Admitting: Primary Care
